# Patient Record
Sex: Male | Born: 1950
Health system: Southern US, Community
[De-identification: ages and names within clinical notes are randomized; demographics above are authoritative.]

## PROBLEM LIST (undated history)

## (undated) DIAGNOSIS — N179 Acute kidney failure, unspecified: Secondary | ICD-10-CM

## (undated) DIAGNOSIS — I10 Essential (primary) hypertension: Secondary | ICD-10-CM

## (undated) DIAGNOSIS — D649 Anemia, unspecified: Secondary | ICD-10-CM

## (undated) DIAGNOSIS — R6521 Severe sepsis with septic shock: Secondary | ICD-10-CM

## (undated) DIAGNOSIS — E669 Obesity, unspecified: Secondary | ICD-10-CM

## (undated) DIAGNOSIS — Z9289 Personal history of other medical treatment: Secondary | ICD-10-CM

## (undated) DIAGNOSIS — Z87442 Personal history of urinary calculi: Secondary | ICD-10-CM

## (undated) DIAGNOSIS — G4730 Sleep apnea, unspecified: Secondary | ICD-10-CM

## (undated) DIAGNOSIS — K2210 Ulcer of esophagus without bleeding: Secondary | ICD-10-CM

## (undated) DIAGNOSIS — T7840XA Allergy, unspecified, initial encounter: Secondary | ICD-10-CM

## (undated) DIAGNOSIS — R7881 Bacteremia: Secondary | ICD-10-CM

## (undated) DIAGNOSIS — K922 Gastrointestinal hemorrhage, unspecified: Secondary | ICD-10-CM

## (undated) DIAGNOSIS — B955 Unspecified streptococcus as the cause of diseases classified elsewhere: Secondary | ICD-10-CM

## (undated) DIAGNOSIS — M1990 Unspecified osteoarthritis, unspecified site: Secondary | ICD-10-CM

## (undated) DIAGNOSIS — I4510 Unspecified right bundle-branch block: Secondary | ICD-10-CM

## (undated) DIAGNOSIS — T8454XA Infection and inflammatory reaction due to internal left knee prosthesis, initial encounter: Secondary | ICD-10-CM

## (undated) DIAGNOSIS — E785 Hyperlipidemia, unspecified: Secondary | ICD-10-CM

## (undated) DIAGNOSIS — A419 Sepsis, unspecified organism: Secondary | ICD-10-CM

## (undated) DIAGNOSIS — B3781 Candidal esophagitis: Secondary | ICD-10-CM

## (undated) DIAGNOSIS — Z86010 Personal history of colonic polyps: Secondary | ICD-10-CM

## (undated) HISTORY — DX: Gastrointestinal hemorrhage, unspecified: K92.2

## (undated) HISTORY — DX: Hyperlipidemia, unspecified: E78.5

## (undated) HISTORY — PX: TONSILLECTOMY: SUR1361

## (undated) HISTORY — DX: Personal history of colonic polyps: Z86.010

## (undated) HISTORY — DX: Severe sepsis with septic shock: R65.21

## (undated) HISTORY — DX: Acute kidney failure, unspecified: N17.9

## (undated) HISTORY — DX: Unspecified streptococcus as the cause of diseases classified elsewhere: B95.5

## (undated) HISTORY — DX: Ulcer of esophagus without bleeding: K22.10

## (undated) HISTORY — DX: Sepsis, unspecified organism: A41.9

## (undated) HISTORY — DX: Candidal esophagitis: B37.81

## (undated) HISTORY — DX: Bacteremia: R78.81

## (undated) HISTORY — DX: Allergy, unspecified, initial encounter: T78.40XA

---

## 1998-08-29 ENCOUNTER — Encounter: Admission: RE | Admit: 1998-08-29 | Discharge: 1998-11-27 | Payer: Self-pay | Admitting: Endocrinology

## 1999-10-23 ENCOUNTER — Ambulatory Visit: Admission: RE | Admit: 1999-10-23 | Discharge: 1999-10-23 | Payer: Self-pay | Admitting: Endocrinology

## 2004-07-16 HISTORY — PX: OTHER SURGICAL HISTORY: SHX169

## 2005-07-03 ENCOUNTER — Ambulatory Visit (HOSPITAL_COMMUNITY): Admission: RE | Admit: 2005-07-03 | Discharge: 2005-07-04 | Payer: Self-pay | Admitting: Orthopedic Surgery

## 2006-07-25 ENCOUNTER — Ambulatory Visit (HOSPITAL_COMMUNITY): Admission: RE | Admit: 2006-07-25 | Discharge: 2006-07-25 | Payer: Self-pay | Admitting: *Deleted

## 2009-07-16 HISTORY — PX: NASAL SEPTUM SURGERY: SHX37

## 2010-10-20 ENCOUNTER — Other Ambulatory Visit (HOSPITAL_COMMUNITY): Payer: Self-pay | Admitting: Otolaryngology

## 2010-10-20 ENCOUNTER — Ambulatory Visit (HOSPITAL_COMMUNITY)
Admission: RE | Admit: 2010-10-20 | Discharge: 2010-10-20 | Disposition: A | Discharge status: Home / Self Care | Payer: BC Managed Care – PPO | Source: Ambulatory Visit | Attending: Otolaryngology | Admitting: Otolaryngology

## 2010-10-20 ENCOUNTER — Encounter (HOSPITAL_COMMUNITY)
Admission: RE | Admit: 2010-10-20 | Discharge: 2010-10-20 | Disposition: A | Discharge status: Home / Self Care | Payer: BC Managed Care – PPO | Source: Ambulatory Visit | Attending: Otolaryngology | Admitting: Otolaryngology

## 2010-10-20 DIAGNOSIS — J342 Deviated nasal septum: Secondary | ICD-10-CM | POA: Insufficient documentation

## 2010-10-20 DIAGNOSIS — Z01810 Encounter for preprocedural cardiovascular examination: Secondary | ICD-10-CM | POA: Insufficient documentation

## 2010-10-20 DIAGNOSIS — Z01812 Encounter for preprocedural laboratory examination: Secondary | ICD-10-CM | POA: Insufficient documentation

## 2010-10-20 DIAGNOSIS — E119 Type 2 diabetes mellitus without complications: Secondary | ICD-10-CM | POA: Insufficient documentation

## 2010-10-20 DIAGNOSIS — Z01818 Encounter for other preprocedural examination: Secondary | ICD-10-CM | POA: Insufficient documentation

## 2010-10-20 DIAGNOSIS — I10 Essential (primary) hypertension: Secondary | ICD-10-CM | POA: Insufficient documentation

## 2010-10-20 DIAGNOSIS — G4730 Sleep apnea, unspecified: Secondary | ICD-10-CM | POA: Insufficient documentation

## 2010-10-20 LAB — BASIC METABOLIC PANEL WITH GFR
BUN: 17 mg/dL (ref 6–23)
CO2: 25 meq/L (ref 19–32)
Calcium: 9.3 mg/dL (ref 8.4–10.5)
Chloride: 105 meq/L (ref 96–112)
Creatinine, Ser: 1.14 mg/dL (ref 0.4–1.5)
GFR calc non Af Amer: >60 mL/min
Glucose, Bld: 160 mg/dL — ABNORMAL HIGH (ref 70–99)
Potassium: 4.3 meq/L (ref 3.5–5.1)
Sodium: 137 meq/L (ref 135–145)

## 2010-10-20 LAB — CBC
HCT: 44.7 % (ref 39.0–52.0)
Hemoglobin: 15 g/dL (ref 13.0–17.0)
MCH: 30.4 pg (ref 26.0–34.0)
MCHC: 33.6 g/dL (ref 30.0–36.0)
MCV: 90.5 fL (ref 78.0–100.0)
Platelets: 261 K/uL (ref 150–400)
RBC: 4.94 MIL/uL (ref 4.22–5.81)
RDW: 13.1 % (ref 11.5–15.5)
WBC: 8.5 K/uL (ref 4.0–10.5)

## 2010-10-20 LAB — SURGICAL PCR SCREEN
MRSA, PCR: POSITIVE — AB
Staphylococcus aureus: POSITIVE — AB

## 2010-10-27 ENCOUNTER — Observation Stay (HOSPITAL_COMMUNITY)
Admission: RE | Admit: 2010-10-27 | Discharge: 2010-10-28 | Disposition: A | Discharge status: Home / Self Care | Payer: BC Managed Care – PPO | Source: Ambulatory Visit | Attending: Otolaryngology | Admitting: Otolaryngology

## 2010-10-27 DIAGNOSIS — E119 Type 2 diabetes mellitus without complications: Secondary | ICD-10-CM | POA: Insufficient documentation

## 2010-10-27 DIAGNOSIS — J342 Deviated nasal septum: Principal | ICD-10-CM | POA: Insufficient documentation

## 2010-10-27 DIAGNOSIS — G4733 Obstructive sleep apnea (adult) (pediatric): Secondary | ICD-10-CM | POA: Insufficient documentation

## 2010-10-27 DIAGNOSIS — J343 Hypertrophy of nasal turbinates: Secondary | ICD-10-CM | POA: Insufficient documentation

## 2010-10-27 LAB — GLUCOSE, CAPILLARY
Glucose-Capillary: 188 mg/dL — ABNORMAL HIGH (ref 70–99)
Glucose-Capillary: 277 mg/dL — ABNORMAL HIGH (ref 70–99)

## 2010-11-13 NOTE — Op Note (Signed)
NAME:  Hunter Santos, Hunter Santos                 ACCOUNT NO.:  [id number]  MEDICAL RECORD NO.:  [id number]           PATIENT TYPE:  O  LOCATION:  2610                         FACILITY:  [facility]  PHYSICIAN:  Kinnie Scales. Annalee Genta, M.D.DATE OF BIRTH:  1950/09/22  DATE OF PROCEDURE:  10/27/2010 DATE OF DISCHARGE:                              OPERATIVE REPORT   PREOPERATIVE DIAGNOSES: 1. Nasal airway obstruction. 2. Deviated nasal septum. 3. Inferior turbinate hypertrophy. 4. Moderately severe obstructive sleep apnea.  POSTOPERATIVE DIAGNOSES: 1. Nasal airway obstruction. 2. Deviated nasal septum. 3. Inferior turbinate hypertrophy. 4. Moderately severe obstructive sleep apnea.  INDICATIONS FOR SURGERY: 1. Nasal airway obstruction. 2. Deviated nasal septum. 3. Inferior turbinate hypertrophy. 4. Moderately severe obstructive sleep apnea.  SURGICAL PROCEDURES: 1. Nasal septoplasty. 2. Bilateral inferior turbinate reduction.  SURGEON:  Kinnie Scales. Annalee Genta, MD.  ANESTHESIA:  General endotracheal.  COMPLICATIONS:  None.  BLOOD LOSS:  Minimal.  The patient was transferred from the operating room to the recovery room in stable condition.  BRIEF HISTORY:  The patient is a 60 year old white male, who was referred with a history of obstructive sleep apnea.  Previous sleep study showed moderately severe sleep apnea and the patient was unable tolerate CPAP because of severe nasal airway obstruction.  Examination in the office showed severely deviated septum with left septal spurring and inferior turbinate hypertrophy.  Given his history, examination and physical findings, I recommended that we undertake nasal septoplasty and turbinate reduction.  The risk, benefits, and possible complications of procedure were discussed with the patient and his wife.  They understood and concurred with out plan for surgery, which is scheduled as an outpatient under general anesthesia with overnight observation for  sleep apnea management.  DESCRIPTION OF PROCEDURE:  The patient was brought to the operating room on October 27, 2010, placed in supine position on the operating table. General endotracheal anesthesia was established without difficulty. When the patient was adequately anesthetized, the nose was injected with total of 8 mL of 1% lidocaine and 1:100,000 solution of epinephrine injected in submucosal fashion on the nasal septum and inferior turbinates bilaterally.  The patient was then position on the operating table and prepped and draped in sterile fashion.  His nose was packed with Afrin soaked cottonoid pledgets and left in place for approximately 10 minutes to allow for vasoconstriction and hemostasis.  Procedure was begun by creating a left anterior hemitransfixion incision.  This was carried through the mucosa underlying submucosa and the mucoperichondrial flap was elevated on the left-hand side.  Bony cartilaginous junction was crossed and mucoperiosteal flap elevated on the right.  Mid septal cartilage was mobilized and then removed, later morselized and returned to the mucoperichondrial pocket.  The deviated bone and cartilage in the mid and posterior aspects of nasal septum were then resected.  A large inferior septal spur was mobilized with a 4-mm osteotome and removed, preserving the overlying mucosa. Mucoperichondrial flaps were reapproximated after replacing the septal cartilage.  The flaps were sutured with a 4-0 gut suture on a Keith needle in horizontal mattress fashion and the anterior hemitransfixion incision was closed with  the same stitch.  Bilateral Doyle nasal septal splints were then placed after the application of Bactroban ointment, sutured in position with a 3-0 Ethilon suture.  Inferior turbinate reduction was then performed with cautery set at 12 watts.  Two submucosal passes were made in each inferior turbinate. Anterior incisions were created in the inferior  turbinate and overlying soft tissue and mucosa was elevated.  Small amount of turbinate bone was then resected.  The turbinates were outfractured to create a more patent nasal cavity.  Nasal cavity and nasopharynx were irrigated and suctioned.  No bleeding.  Orogastric tube was passed.  Stomach contents were aspirated.  The patient was awakened from his anesthetic, extubated, and then transferred from the operating room to the recovery room in stable condition.  No complications.  Blood loss was minimal.          ______________________________ Kinnie Scales. Annalee Genta, M.D.     DLS/MEDQ  D:  16/04/9603  T:  10/27/2010  Job:  [id number]  Electronically Signed by Osborn Coho M.D. on 11/13/2010 10:23:53 AM

## 2012-04-11 NOTE — H&P (Signed)
Hunter Santos is an 61 y.o. male.    Chief Complaint:   Left knee OA and pain   HPI: Hunter Santos is a 61 y.o. male complaining of left knee pain for 7 years.  He has had increasing pain in the left ankle and had surgery on the ankle in 2006. He has been doing well since that time, but the knee continues to increase in pain. Pain had continually increased since the beginning, especially over the last couple of months.  It has been effecting him so significantly that he has had to change positions at his job, so that he will put less stress on knee. X-rays in the clinic show bone-on-bone end-stage arthritic changes of the left knee. Hunter Santos has tried various conservative treatments which have failed to alleviate their symptoms. Various options are discussed with the patient. Risks, benefits and expectations were discussed with the patient. Patient understand the risks, benefits and expectations and wishes to proceed with surgery.   PCP:  No primary provider on file.  D/C Plans:  Home with HHPT  Post-op Meds:   Rx given for ASA, Robaxin, Iron, Colace and MiraLax  Tranexamic Acid:   To be given  Decadron:   Not to be given  PMH: HTN Sleep apnea DM Arthritis  PSH: Left ankle surgery  04/2005  Social History: Patient denies the use of tobacco  Allergies:  NKDA  Medications: Aspirin               81 mg          1 PO daily Glimepiride          4 mg          1 PO daily Lasix                 40 mg           1 PO daily Amlodipine        10 mg           1 PO daily Ramipril             10 mg          1 PO daily Metformin       1000 mg           Pravastatin        40 mg          1 PO daily  ROS: Review of Systems  Constitutional: Negative.   HENT: Negative.   Eyes: Negative.   Respiratory: Negative.   Cardiovascular: Negative.   Gastrointestinal: Negative.   Genitourinary: Negative.   Musculoskeletal: Positive for joint pain.  Skin: Negative.   Neurological: Negative.     Endo/Heme/Allergies: Negative.   Psychiatric/Behavioral: Negative.      Physical Exam: BP:   132/80  ;  HR:   80  ; Resp:   16  ; Physical Exam  Constitutional: He is oriented to person, place, and time and well-developed, well-nourished, and in no distress.  HENT:  Head: Normocephalic and atraumatic.  Nose: Nose normal.  Mouth/Throat: Oropharynx is clear and moist.  Eyes: Pupils are equal, round, and reactive to light.  Neck: Neck supple. No JVD present. No tracheal deviation present. No thyromegaly present.  Cardiovascular: Normal rate, regular rhythm and intact distal pulses.   Pulmonary/Chest: Effort normal and breath sounds normal. No stridor. No respiratory distress. He has no wheezes.  Abdominal: Soft. There is no tenderness. There is no  guarding.  Musculoskeletal:       Left knee: He exhibits decreased range of motion, swelling, deformity (valgus) and bony tenderness. He exhibits no effusion, no ecchymosis and no laceration. tenderness found.  Lymphadenopathy:    He has no cervical adenopathy.  Neurological: He is alert and oriented to person, place, and time.  Skin: Skin is warm and dry.  Psychiatric: Affect normal.    Assessment/Plan Assessment:   Left knee OA and pain   Plan: Patient will undergo a left total knee arthroplasty on 04/29/2012 per Dr. Charlann Boxer at [facility]. Risks benefits and expectations were discussed with the patient. Patient understand risks, benefits and expectations and wishes to proceed.   Anastasio Auerbach Baeleigh Devincent   PAC  04/11/2012, 11:33 AM

## 2012-04-17 NOTE — Progress Notes (Signed)
Medical clearance note dr Juleen China on chart.

## 2012-04-18 ENCOUNTER — Encounter (HOSPITAL_COMMUNITY): Payer: Self-pay | Admitting: Pharmacy Technician

## 2012-04-18 ENCOUNTER — Encounter (HOSPITAL_COMMUNITY)
Admission: RE | Admit: 2012-04-18 | Discharge: 2012-04-18 | Disposition: A | Discharge status: Home / Self Care | Payer: BC Managed Care – PPO | Source: Ambulatory Visit | Attending: Orthopedic Surgery | Admitting: Orthopedic Surgery

## 2012-04-18 ENCOUNTER — Encounter (HOSPITAL_COMMUNITY): Payer: Self-pay

## 2012-04-18 HISTORY — DX: Sleep apnea, unspecified: G47.30

## 2012-04-18 HISTORY — DX: Essential (primary) hypertension: I10

## 2012-04-18 LAB — BASIC METABOLIC PANEL
BUN: 18 mg/dL (ref 6–23)
Creatinine, Ser: 0.85 mg/dL (ref 0.50–1.35)
GFR calc Af Amer: >90 mL/min (ref >90)
GFR calc non Af Amer: >90 mL/min (ref >90)
Potassium: 4.3 mEq/L (ref 3.5–5.1)

## 2012-04-18 LAB — PROTIME-INR
INR: 0.96 (ref 0.00–1.49)
Prothrombin Time: 12.7 seconds (ref 11.6–15.2)

## 2012-04-18 LAB — URINALYSIS, ROUTINE W REFLEX MICROSCOPIC
Bilirubin Urine: NEGATIVE
Ketones, ur: NEGATIVE mg/dL
Nitrite: NEGATIVE
Protein, ur: NEGATIVE mg/dL
Urobilinogen, UA: 0.2 mg/dL (ref 0.0–1.0)

## 2012-04-18 LAB — CBC
MCHC: 33.3 g/dL (ref 30.0–36.0)
RDW: 12.8 % (ref 11.5–15.5)

## 2012-04-18 LAB — APTT: aPTT: 29 seconds (ref 24–37)

## 2012-04-18 LAB — SURGICAL PCR SCREEN: MRSA, PCR: NEGATIVE

## 2012-04-18 NOTE — Progress Notes (Signed)
Chest x-ray from [facility]  07/19/2011 on chart, EKG from [facility] 03/30/2005 on chart.

## 2012-04-18 NOTE — Patient Instructions (Signed)
20      Your procedure is scheduled on:  Tuesday 04/29/2012 at 0715 am  Report to [facility] at 0515  AM.  Call this number if you have problems the morning of surgery: [phone]   Remember:   Do not eat food or drink liquids after midnight!  Take these medicines the morning of surgery with A SIP OF WATER: Amlodipine   Do not bring valuables to the hospital.  .  Leave suitcase in the car. After surgery it may be brought to your room.  For patients admitted to the hospital, checkout time is 11:00 AM the day of              Discharge.    Special Instructions: See [facility] Preparing  For Surgery Instruction Sheet. Do not wear jewelry, lotions powders, perfumes. Women do not shave legs or underarms for 12 hours before showers. Contacts, partial plates, or dentures may not be worn into surgery.                          Patients discharged the day of surgery will not be allowed to drive home.  If going home the same day of surgery, must have someone stay with you first 24 hrs.at home and arrange for someone to drive you home from the              Hospital.   Please read over the following fact sheets that you were given: MRSA              INFORMATION, Blood Transfusion sheet, Incentive Spirometry sheet, Sleep apnea sheet               Telford Nab.Xolani Degracia,RN,BSN [id number]

## 2012-04-29 ENCOUNTER — Inpatient Hospital Stay (HOSPITAL_COMMUNITY)
Admission: RE | Admit: 2012-04-29 | Discharge: 2012-04-30 | DRG: 209 | Disposition: A | Discharge status: Home Health | Payer: BC Managed Care – PPO | Source: Ambulatory Visit | Attending: Orthopedic Surgery | Admitting: Orthopedic Surgery

## 2012-04-29 ENCOUNTER — Encounter (HOSPITAL_COMMUNITY): Payer: Self-pay | Admitting: Certified Registered Nurse Anesthetist

## 2012-04-29 ENCOUNTER — Ambulatory Visit (HOSPITAL_COMMUNITY): Payer: BC Managed Care – PPO | Admitting: Certified Registered Nurse Anesthetist

## 2012-04-29 ENCOUNTER — Encounter (HOSPITAL_COMMUNITY)
Admission: RE | Disposition: A | Discharge status: Home Health | Payer: Self-pay | Source: Ambulatory Visit | Attending: Orthopedic Surgery

## 2012-04-29 ENCOUNTER — Encounter (HOSPITAL_COMMUNITY): Payer: Self-pay | Admitting: *Deleted

## 2012-04-29 DIAGNOSIS — E871 Hypo-osmolality and hyponatremia: Secondary | ICD-10-CM | POA: Diagnosis not present

## 2012-04-29 DIAGNOSIS — E119 Type 2 diabetes mellitus without complications: Secondary | ICD-10-CM | POA: Diagnosis present

## 2012-04-29 DIAGNOSIS — E669 Obesity, unspecified: Secondary | ICD-10-CM | POA: Diagnosis present

## 2012-04-29 DIAGNOSIS — D62 Acute posthemorrhagic anemia: Secondary | ICD-10-CM | POA: Diagnosis not present

## 2012-04-29 DIAGNOSIS — M1710 Unilateral primary osteoarthritis, unspecified knee: Principal | ICD-10-CM | POA: Diagnosis present

## 2012-04-29 DIAGNOSIS — Z01812 Encounter for preprocedural laboratory examination: Secondary | ICD-10-CM

## 2012-04-29 DIAGNOSIS — I10 Essential (primary) hypertension: Secondary | ICD-10-CM | POA: Diagnosis present

## 2012-04-29 DIAGNOSIS — Z96659 Presence of unspecified artificial knee joint: Secondary | ICD-10-CM

## 2012-04-29 HISTORY — PX: TOTAL KNEE ARTHROPLASTY: SHX125

## 2012-04-29 LAB — ABO/RH: ABO/RH(D): AB POS

## 2012-04-29 LAB — GLUCOSE, CAPILLARY
Glucose-Capillary: 121 mg/dL — ABNORMAL HIGH (ref 70–99)
Glucose-Capillary: 173 mg/dL — ABNORMAL HIGH (ref 70–99)

## 2012-04-29 LAB — TYPE AND SCREEN

## 2012-04-29 SURGERY — ARTHROPLASTY, KNEE, TOTAL
Anesthesia: Spinal | Site: Knee | Laterality: Left | Wound class: Clean

## 2012-04-29 MED ORDER — TRANEXAMIC ACID 100 MG/ML IV SOLN
1880.0000 mg | Freq: Once | INTRAVENOUS | Status: DC
  Filled 2012-04-29: qty 18.8

## 2012-04-29 MED ORDER — OXYCODONE HCL 5 MG PO TABS: 5.0000 mg | ORAL_TABLET | Freq: Once | ORAL | Status: DC | PRN

## 2012-04-29 MED ORDER — CEFAZOLIN SODIUM 1-5 GM-% IV SOLN
INTRAVENOUS | Status: AC
  Filled 2012-04-29: qty 50

## 2012-04-29 MED ORDER — INSULIN ASPART 100 UNIT/ML ~~LOC~~ SOLN
0.0000 [IU] | Freq: Three times a day (TID) | SUBCUTANEOUS | Status: DC
  Administered 2012-04-29 (×2): 2 [IU] via SUBCUTANEOUS
  Administered 2012-04-30: 3 [IU] via SUBCUTANEOUS
  Administered 2012-04-30: 2 [IU] via SUBCUTANEOUS

## 2012-04-29 MED ORDER — SODIUM CHLORIDE 0.9 % IV SOLN
INTRAVENOUS | Status: DC
  Administered 2012-04-29 (×2): via INTRAVENOUS
  Filled 2012-04-29 (×10): qty 1000

## 2012-04-29 MED ORDER — LACTATED RINGERS IV SOLN
INTRAVENOUS | Status: DC | PRN
  Administered 2012-04-29 (×2): via INTRAVENOUS

## 2012-04-29 MED ORDER — METHOCARBAMOL 500 MG PO TABS
500.0000 mg | ORAL_TABLET | Freq: Four times a day (QID) | ORAL | Status: DC | PRN
  Administered 2012-04-29 – 2012-04-30 (×2): 500 mg via ORAL
  Filled 2012-04-29 (×2): qty 1

## 2012-04-29 MED ORDER — AMLODIPINE BESYLATE 10 MG PO TABS
10.0000 mg | ORAL_TABLET | Freq: Every day | ORAL | Status: DC
  Administered 2012-04-30: 10 mg via ORAL
  Filled 2012-04-29: qty 1

## 2012-04-29 MED ORDER — DEXTROSE 5 % IV SOLN
500.0000 mg | Freq: Four times a day (QID) | INTRAVENOUS | Status: DC | PRN
  Administered 2012-04-29: 500 mg via INTRAVENOUS
  Filled 2012-04-29 (×2): qty 5

## 2012-04-29 MED ORDER — BUPIVACAINE-EPINEPHRINE PF 0.25-1:200000 % IJ SOLN
INTRAMUSCULAR | Status: DC | PRN
  Administered 2012-04-29: 50 mL

## 2012-04-29 MED ORDER — ONDANSETRON HCL 4 MG PO TABS: 4.0000 mg | ORAL_TABLET | Freq: Four times a day (QID) | ORAL | Status: DC | PRN

## 2012-04-29 MED ORDER — DEXTROSE 5 % IV SOLN
3.0000 g | Freq: Once | INTRAVENOUS | Status: AC
  Administered 2012-04-29: 1 g via INTRAVENOUS
  Administered 2012-04-29: 2 g via INTRAVENOUS

## 2012-04-29 MED ORDER — CEFAZOLIN SODIUM-DEXTROSE 2-3 GM-% IV SOLR
INTRAVENOUS | Status: AC
  Filled 2012-04-29: qty 50

## 2012-04-29 MED ORDER — ACETAMINOPHEN 10 MG/ML IV SOLN
INTRAVENOUS | Status: DC | PRN
  Administered 2012-04-29: 1000 mg via INTRAVENOUS

## 2012-04-29 MED ORDER — HYDROMORPHONE HCL PF 1 MG/ML IJ SOLN
0.5000 mg | INTRAMUSCULAR | Status: DC | PRN
  Administered 2012-04-29: 0.5 mg via INTRAVENOUS
  Filled 2012-04-29: qty 1

## 2012-04-29 MED ORDER — MEPERIDINE HCL 50 MG/ML IJ SOLN: 6.2500 mg | INTRAMUSCULAR | Status: DC | PRN

## 2012-04-29 MED ORDER — GLIMEPIRIDE 4 MG PO TABS
4.0000 mg | ORAL_TABLET | Freq: Every day | ORAL | Status: DC
  Administered 2012-04-30: 4 mg via ORAL
  Filled 2012-04-29 (×3): qty 1

## 2012-04-29 MED ORDER — INSULIN ASPART 100 UNIT/ML ~~LOC~~ SOLN
2.0000 [IU] | Freq: Once | SUBCUTANEOUS | Status: AC
  Administered 2012-04-29: 2 [IU] via SUBCUTANEOUS

## 2012-04-29 MED ORDER — PROPOFOL INFUSION 10 MG/ML OPTIME
INTRAVENOUS | Status: DC | PRN
  Administered 2012-04-29: 50 ug/kg/min via INTRAVENOUS

## 2012-04-29 MED ORDER — 0.9 % SODIUM CHLORIDE (POUR BTL) OPTIME
TOPICAL | Status: DC | PRN
  Administered 2012-04-29: 1000 mL

## 2012-04-29 MED ORDER — HYDROMORPHONE HCL PF 1 MG/ML IJ SOLN: 0.2500 mg | INTRAMUSCULAR | Status: DC | PRN

## 2012-04-29 MED ORDER — TRANEXAMIC ACID 100 MG/ML IV SOLN
2500.0000 mg | INTRAVENOUS | Status: DC | PRN
  Administered 2012-04-29: 1880 mg via INTRAVENOUS

## 2012-04-29 MED ORDER — BUPIVACAINE HCL 0.75 % IJ SOLN
INTRAMUSCULAR | Status: DC | PRN
  Administered 2012-04-29: 2 mL via INTRATHECAL

## 2012-04-29 MED ORDER — ALUMINUM HYDROXIDE GEL 320 MG/5ML PO SUSP: 15.0000 mL | ORAL | Status: DC | PRN

## 2012-04-29 MED ORDER — CEFAZOLIN SODIUM-DEXTROSE 2-3 GM-% IV SOLR
2.0000 g | Freq: Four times a day (QID) | INTRAVENOUS | Status: AC
  Administered 2012-04-29 (×2): 2 g via INTRAVENOUS
  Filled 2012-04-29 (×2): qty 50

## 2012-04-29 MED ORDER — DOCUSATE SODIUM 100 MG PO CAPS
100.0000 mg | ORAL_CAPSULE | Freq: Two times a day (BID) | ORAL | Status: DC
  Administered 2012-04-29 – 2012-04-30 (×3): 100 mg via ORAL

## 2012-04-29 MED ORDER — METFORMIN HCL 500 MG PO TABS
1000.0000 mg | ORAL_TABLET | Freq: Two times a day (BID) | ORAL | Status: DC
  Administered 2012-04-29 – 2012-04-30 (×2): 1000 mg via ORAL
  Filled 2012-04-29 (×5): qty 2

## 2012-04-29 MED ORDER — DIPHENHYDRAMINE HCL 12.5 MG/5ML PO ELIX: 25.0000 mg | ORAL_SOLUTION | Freq: Four times a day (QID) | ORAL | Status: DC | PRN

## 2012-04-29 MED ORDER — OXYCODONE HCL 5 MG/5ML PO SOLN
5.0000 mg | Freq: Once | ORAL | Status: DC | PRN
  Filled 2012-04-29: qty 5

## 2012-04-29 MED ORDER — HYDROMORPHONE HCL PF 1 MG/ML IJ SOLN: 0.2000 mg | INTRAMUSCULAR | Status: DC | PRN

## 2012-04-29 MED ORDER — CHLORHEXIDINE GLUCONATE 4 % EX LIQD: 60.0000 mL | Freq: Once | CUTANEOUS | Status: DC

## 2012-04-29 MED ORDER — BUPIVACAINE-EPINEPHRINE 0.25% -1:200000 IJ SOLN
INTRAMUSCULAR | Status: AC
  Filled 2012-04-29: qty 1

## 2012-04-29 MED ORDER — FENTANYL CITRATE 0.05 MG/ML IJ SOLN
INTRAMUSCULAR | Status: DC | PRN
  Administered 2012-04-29: 50 ug via INTRAVENOUS
  Administered 2012-04-29: 25 ug via INTRAVENOUS
  Administered 2012-04-29 (×2): 50 ug via INTRAVENOUS
  Administered 2012-04-29: 25 ug via INTRAVENOUS
  Administered 2012-04-29: 50 ug via INTRAVENOUS

## 2012-04-29 MED ORDER — PHENOL 1.4 % MT LIQD
1.0000 | OROMUCOSAL | Status: DC | PRN
  Filled 2012-04-29: qty 177

## 2012-04-29 MED ORDER — LIDOCAINE HCL (CARDIAC) 20 MG/ML IV SOLN
INTRAVENOUS | Status: DC | PRN
  Administered 2012-04-29: 50 mg via INTRAVENOUS

## 2012-04-29 MED ORDER — RIVAROXABAN 10 MG PO TABS
10.0000 mg | ORAL_TABLET | Freq: Every day | ORAL | Status: DC
  Administered 2012-04-30: 10 mg via ORAL
  Filled 2012-04-29 (×3): qty 1

## 2012-04-29 MED ORDER — ONDANSETRON HCL 4 MG/2ML IJ SOLN
4.0000 mg | Freq: Four times a day (QID) | INTRAMUSCULAR | Status: DC | PRN
  Administered 2012-04-29: 4 mg via INTRAVENOUS
  Filled 2012-04-29: qty 2

## 2012-04-29 MED ORDER — MENTHOL 3 MG MT LOZG
1.0000 | LOZENGE | OROMUCOSAL | Status: DC | PRN
  Filled 2012-04-29: qty 9

## 2012-04-29 MED ORDER — LACTATED RINGERS IV SOLN: INTRAVENOUS | Status: DC

## 2012-04-29 MED ORDER — SENNA 8.6 MG PO TABS
1.0000 | ORAL_TABLET | Freq: Two times a day (BID) | ORAL | Status: DC
  Administered 2012-04-29 – 2012-04-30 (×3): 8.6 mg via ORAL
  Filled 2012-04-29 (×3): qty 1

## 2012-04-29 MED ORDER — KETOROLAC TROMETHAMINE 30 MG/ML IJ SOLN
INTRAMUSCULAR | Status: DC | PRN
  Administered 2012-04-29: 30 mg via INTRAVENOUS

## 2012-04-29 MED ORDER — KETOROLAC TROMETHAMINE 30 MG/ML IJ SOLN
30.0000 mg | Freq: Once | INTRAMUSCULAR | Status: DC
  Filled 2012-04-29: qty 1

## 2012-04-29 MED ORDER — MIDAZOLAM HCL 5 MG/5ML IJ SOLN
INTRAMUSCULAR | Status: DC | PRN
  Administered 2012-04-29 (×2): 1 mg via INTRAVENOUS

## 2012-04-29 MED ORDER — FUROSEMIDE 40 MG PO TABS
40.0000 mg | ORAL_TABLET | Freq: Every day | ORAL | Status: DC
  Administered 2012-04-29 – 2012-04-30 (×2): 40 mg via ORAL
  Filled 2012-04-29 (×2): qty 1

## 2012-04-29 MED ORDER — FERROUS SULFATE 325 (65 FE) MG PO TABS
325.0000 mg | ORAL_TABLET | Freq: Three times a day (TID) | ORAL | Status: DC
  Administered 2012-04-29 – 2012-04-30 (×4): 325 mg via ORAL
  Filled 2012-04-29 (×7): qty 1

## 2012-04-29 MED ORDER — HYDROCODONE-ACETAMINOPHEN 7.5-325 MG PO TABS
1.0000 | ORAL_TABLET | ORAL | Status: DC
  Administered 2012-04-29 (×2): 1 via ORAL
  Administered 2012-04-29 – 2012-04-30 (×6): 2 via ORAL
  Filled 2012-04-29: qty 2
  Filled 2012-04-29: qty 1
  Filled 2012-04-29: qty 2
  Filled 2012-04-29: qty 1
  Filled 2012-04-29 (×4): qty 2

## 2012-04-29 MED ORDER — ACETAMINOPHEN 10 MG/ML IV SOLN
INTRAVENOUS | Status: AC
  Filled 2012-04-29: qty 100

## 2012-04-29 MED ORDER — POLYETHYLENE GLYCOL 3350 17 G PO PACK: 17.0000 g | PACK | Freq: Every day | ORAL | Status: DC | PRN

## 2012-04-29 MED ORDER — RAMIPRIL 10 MG PO TABS
10.0000 mg | ORAL_TABLET | Freq: Every day | ORAL | Status: DC
  Administered 2012-04-29: 10 mg via ORAL
  Filled 2012-04-29 (×2): qty 1

## 2012-04-29 MED ORDER — PROMETHAZINE HCL 25 MG/ML IJ SOLN: 6.2500 mg | INTRAMUSCULAR | Status: DC | PRN

## 2012-04-29 SURGICAL SUPPLY — 59 items
ADH SKN CLS APL DERMABOND .7 (GAUZE/BANDAGES/DRESSINGS) ×1
BAG SPEC THK2 15X12 ZIP CLS (MISCELLANEOUS) ×1
BAG ZIPLOCK 12X15 (MISCELLANEOUS) ×2 IMPLANT
BANDAGE ELASTIC 6 VELCRO ST LF (GAUZE/BANDAGES/DRESSINGS) ×2 IMPLANT
BANDAGE ESMARK 6X9 LF (GAUZE/BANDAGES/DRESSINGS) ×1 IMPLANT
BLADE SAW SGTL 13.0X1.19X90.0M (BLADE) ×2 IMPLANT
BNDG CMPR 9X6 STRL LF SNTH (GAUZE/BANDAGES/DRESSINGS) ×1
BNDG ESMARK 6X9 LF (GAUZE/BANDAGES/DRESSINGS) ×2
BONE CEMENT GENTAMICIN (Cement) ×4 IMPLANT
BOWL SMART MIX CTS (DISPOSABLE) ×2 IMPLANT
CEMENT BONE GENTAMICIN 40 (Cement) IMPLANT
CLOTH BEACON ORANGE TIMEOUT ST (SAFETY) ×2 IMPLANT
CUFF TOURN SGL QUICK 34 (TOURNIQUET CUFF) ×2
CUFF TRNQT CYL 34X4X40X1 (TOURNIQUET CUFF) ×1 IMPLANT
DECANTER SPIKE VIAL GLASS SM (MISCELLANEOUS) ×2 IMPLANT
DERMABOND ADVANCED (GAUZE/BANDAGES/DRESSINGS) ×1
DERMABOND ADVANCED .7 DNX12 (GAUZE/BANDAGES/DRESSINGS) ×1 IMPLANT
DRAPE EXTREMITY T 121X128X90 (DRAPE) ×2 IMPLANT
DRAPE POUCH INSTRU U-SHP 10X18 (DRAPES) ×2 IMPLANT
DRAPE U-SHAPE 47X51 STRL (DRAPES) ×2 IMPLANT
DRSG AQUACEL AG ADV 3.5X10 (GAUZE/BANDAGES/DRESSINGS) ×2 IMPLANT
DRSG TEGADERM 4X4.75 (GAUZE/BANDAGES/DRESSINGS) ×2 IMPLANT
DURAPREP 26ML APPLICATOR (WOUND CARE) ×2 IMPLANT
ELECT REM PT RETURN 9FT ADLT (ELECTROSURGICAL) ×2
ELECTRODE REM PT RTRN 9FT ADLT (ELECTROSURGICAL) ×1 IMPLANT
EVACUATOR 1/8 PVC DRAIN (DRAIN) ×2 IMPLANT
FACESHIELD LNG OPTICON STERILE (SAFETY) ×10 IMPLANT
GAUZE SPONGE 2X2 8PLY STRL LF (GAUZE/BANDAGES/DRESSINGS) ×1 IMPLANT
GLOVE BIOGEL PI IND STRL 7.5 (GLOVE) ×1 IMPLANT
GLOVE BIOGEL PI IND STRL 8 (GLOVE) ×1 IMPLANT
GLOVE BIOGEL PI INDICATOR 7.5 (GLOVE) ×1
GLOVE BIOGEL PI INDICATOR 8 (GLOVE) ×1
GLOVE ECLIPSE 8.0 STRL XLNG CF (GLOVE) ×2 IMPLANT
GLOVE ORTHO TXT STRL SZ7.5 (GLOVE) ×4 IMPLANT
GOWN BRE IMP PREV XXLGXLNG (GOWN DISPOSABLE) ×4 IMPLANT
GOWN STRL NON-REIN LRG LVL3 (GOWN DISPOSABLE) ×2 IMPLANT
HANDPIECE INTERPULSE COAX TIP (DISPOSABLE) ×2
IMMOBILIZER KNEE 20 (SOFTGOODS) ×2
IMMOBILIZER KNEE 20 THIGH 36 (SOFTGOODS) IMPLANT
KIT BASIN OR (CUSTOM PROCEDURE TRAY) ×2 IMPLANT
MANIFOLD NEPTUNE II (INSTRUMENTS) ×2 IMPLANT
NDL SAFETY ECLIPSE 18X1.5 (NEEDLE) ×1 IMPLANT
NEEDLE HYPO 18GX1.5 SHARP (NEEDLE) ×2
NS IRRIG 1000ML POUR BTL (IV SOLUTION) ×4 IMPLANT
PACK TOTAL JOINT (CUSTOM PROCEDURE TRAY) ×2 IMPLANT
POSITIONER SURGICAL ARM (MISCELLANEOUS) ×2 IMPLANT
SET HNDPC FAN SPRY TIP SCT (DISPOSABLE) ×1 IMPLANT
SET PAD KNEE POSITIONER (MISCELLANEOUS) ×2 IMPLANT
SPONGE GAUZE 2X2 STER 10/PKG (GAUZE/BANDAGES/DRESSINGS) ×1
SUCTION FRAZIER 12FR DISP (SUCTIONS) ×2 IMPLANT
SUT MNCRL AB 4-0 PS2 18 (SUTURE) ×2 IMPLANT
SUT VIC AB 1 CT1 36 (SUTURE) ×6 IMPLANT
SUT VIC AB 2-0 CT1 27 (SUTURE) ×6
SUT VIC AB 2-0 CT1 TAPERPNT 27 (SUTURE) ×3 IMPLANT
SYR 50ML LL SCALE MARK (SYRINGE) ×2 IMPLANT
TOWEL OR 17X26 10 PK STRL BLUE (TOWEL DISPOSABLE) ×4 IMPLANT
TRAY FOLEY CATH 14FRSI W/METER (CATHETERS) ×2 IMPLANT
WATER STERILE IRR 1500ML POUR (IV SOLUTION) ×2 IMPLANT
WRAP KNEE MAXI GEL POST OP (GAUZE/BANDAGES/DRESSINGS) ×2 IMPLANT

## 2012-04-29 NOTE — Anesthesia Postprocedure Evaluation (Signed)
Anesthesia Post Note  Patient: Hunter Santos  Procedure(s) Performed: Procedure(s) (LRB): TOTAL KNEE ARTHROPLASTY (Left)  Anesthesia type: Spinal  Patient location: PACU  Post pain: Pain level controlled  Post assessment: Post-op Vital signs reviewed  Last Vitals: BP 122/75  Pulse 66  Temp 36.1 C (Oral)  Resp 16  Wt 276 lb (125.193 kg)  SpO2 99%  Post vital signs: Reviewed  Level of consciousness: sedated  Complications: No apparent anesthesia complications

## 2012-04-29 NOTE — Transfer of Care (Signed)
Immediate Anesthesia Transfer of Care Note  Patient: Hunter Santos  Procedure(s) Performed: Procedure(s) (LRB) with comments: TOTAL KNEE ARTHROPLASTY (Left)  Patient Location: PACU  Anesthesia Type: Regional  Level of Consciousness: awake, alert  and oriented  Airway & Oxygen Therapy: Patient connected to face mask oxygen  Post-op Assessment: Report given to PACU RN  Post vital signs: Reviewed and stable  Complications: No apparent anesthesia complications

## 2012-04-29 NOTE — Anesthesia Procedure Notes (Signed)
Spinal  Patient location during procedure: OR Start time: 04/29/2012 7:08 AM End time: 04/29/2012 7:12 AM Staffing Anesthesiologist: Lewie Loron R Performed by: anesthesiologist  Preanesthetic Checklist Completed: patient identified, site marked, surgical consent, pre-op evaluation, timeout performed, IV checked, risks and benefits discussed and monitors and equipment checked Spinal Block Patient position: sitting Prep: ChloraPrep Patient monitoring: heart rate, continuous pulse ox and blood pressure Approach: midline Location: L2-3 Injection technique: single-shot Needle Needle type: Quincke  Needle gauge: 22 G Needle length: 9 cm Needle insertion depth: 9 cm Assessment Sensory level: T10 Additional Notes Expiration date of kit checked and confirmed. Patient tolerated procedure well, without complications.

## 2012-04-29 NOTE — Op Note (Signed)
NAME:  Hunter Santos                      MEDICAL RECORD NO.:  [id number]                             FACILITY:  [facility]      PHYSICIAN:  Madlyn Frankel. Charlann Boxer, M.D.  DATE OF BIRTH:  [birth year]      DATE OF PROCEDURE:  04/29/2012                                     OPERATIVE REPORT         PREOPERATIVE DIAGNOSIS:  Left knee osteoarthritis.      POSTOPERATIVE DIAGNOSIS:  Left knee osteoarthritis.      FINDINGS:  The patient was noted to have complete loss of cartilage and   bone-on-bone arthritis with associated osteophytes in the lateral and patellofemoral compartments of   the knee with a significant synovitis and associated effusion. Patient had significant dynamic valgus deformity associated with lateral bone defect, no associated flexion contracture.     PROCEDURE:  Left total knee replacement.      COMPONENTS USED:  DePuy rotating platform posterior stabilized knee   system, a size 5 femur, 5 tibia, 15 mm insert, and 38 patellar   button.      SURGEON:  Madlyn Frankel. Charlann Boxer, M.D.      ASSISTANT:  [name], PA-C.      ANESTHESIA:  Spinal.      SPECIMENS:  None.      COMPLICATION:  None.      DRAINS:  One Hemovac.  EBL: <150cc      TOURNIQUET TIME:   Total Tourniquet Time Documented: Thigh (Left) - 45 minutes .      The patient was stable to the recovery room.      INDICATION FOR PROCEDURE:  Hunter Santos is a 61 y.o. male patient of   mine.  The patient had been seen, evaluated, and treated conservatively in the   office with medication, activity modification, and injections.  The patient had   radiographic changes of bone-on-bone arthritis with endplate sclerosis and osteophytes noted.      The patient failed conservative measures including medication, injections, and activity modification, and at this point was ready for more definitive measures.   Based on the radiographic changes and failed conservative measures, the patient   decided to proceed with total knee  replacement.  Risks of infection,   DVT, component failure, need for revision surgery, postop course, and   expectations were all   discussed and reviewed.  Consent was obtained for benefit of pain   relief.      PROCEDURE IN DETAIL:  The patient was brought to the operative theater.   Once adequate anesthesia, preoperative antibiotics, 3 gm of Ancef administered, the patient was positioned supine with the left thigh tourniquet placed.  The  left lower extremity was prepped and draped in sterile fashion.  A time-   out was performed identifying the patient, planned procedure, and   extremity.      The left lower extremity was placed in the [facility] leg holder.  The leg was   exsanguinated, tourniquet elevated to 250 mmHg.  A midline incision was   made followed by median parapatellar arthrotomy.  Following  initial   exposure, attention was first directed to the patella.  Precut   measurement was noted to be 23 mm.  I resected down to 14 mm and used a   38 patellar button to restore patellar height as well as cover the cut   surface.      The lug holes were drilled and a metal shim was placed to protect the   patella from retractors and saw blades.      At this point, attention was now directed to the femur.  The femoral   canal was opened with a drill, irrigated to try to prevent fat emboli.  An   intramedullary rod was passed at 5 degrees valgus, 10 mm of bone was   resected off the distal femur.  Following this resection, the tibia was   subluxated anteriorly.  Using the extramedullary guide, 1-2 mm of bone was resected off   the proximal lateral tibia.  We confirmed the gap would be   stable medially and laterally with a 10 mm insert as well as confirmed   the cut was perpendicular in the coronal plane, checking with an alignment rod. These cuts represent minimal cuts as there was still exposed sclerotic bone on the lateral tibial plateau.      Once this was done, I sized the femur  to be a size 5 in the anterior-   posterior dimension, chose a standard component based on medial and   lateral dimension.  The size 5 rotation block was then pinned in   position anterior referenced using the C-clamp to set rotation.  The   anterior, posterior, and  chamfer cuts were made without difficulty nor   notching making certain that I was along the anterior cortex to help   with flexion gap stability.      The final box cut was made off the lateral aspect of distal femur.      At this point, the tibia was sized to be a size 5, the size 5 tray was   then pinned in position through the medial third of the tubercle,   drilled, and keel punched.  Trial reduction was now carried with a 5 femur,  5 tibia, a 15 mm insert, and the 38 patella botton.  The knee was brought to   extension, full extension with good flexion stability with the patella   tracking through the trochlea without application of pressure.  Given   all these findings, the trial components removed.  I drilled holes into remaining sclerotic bone on the proximall lateral tibia and the distal femur to allow for improved cement interdigitation. Final components were   opened and cement was mixed.  The knee was irrigated with normal saline   solution and pulse lavage.  The synovial lining was   then injected with 0.25% Marcaine with epinephrine and 1 cc of Toradol,   total of 61 cc.      The knee was irrigated.  Final implants were then cemented onto clean and   dried cut surfaces of bone with the knee brought to extension with a 15   mm trial insert.      Once the cement had fully cured, the excess cement was removed   throughout the knee.  I confirmed I was satisfied with the range of   motion and stability, and the final 15 mm PS insert was chosen.  It was   placed into the knee.  The tourniquet had been let down at 44 minutes.  No significant   hemostasis required.  The medium Hemovac drain was placed deep.   The   extensor mechanism was then reapproximated using #1 Vicryl with the knee   in flexion.  The   remaining wound was closed with 2-0 Vicryl and running 4-0 Monocryl.   The knee was cleaned, dried, dressed sterilely using Dermabond and   Aquacel dressing.  Drain site dressed separately.  The patient was then   brought to recovery room in stable condition, tolerating the procedure   well.   Please note that Physician Assistant, [name], was present for the entirety of the case, and was utilized for pre-operative positioning, peri-operative retractor management, general facilitation of the procedure.  He was also utilized for primary wound closure at the end of the case.              Madlyn Frankel Charlann Boxer, M.D.

## 2012-04-29 NOTE — Plan of Care (Signed)
Problem: Consults Goal: Diagnosis- Total Joint Replacement Primary Total Knee     

## 2012-04-29 NOTE — Evaluation (Signed)
Physical Therapy Evaluation Patient Details Name: Hunter Santos MRN: [id number] DOB: [birth year] Today's Date: 04/29/2012 Time: 7829-5621 PT Time Calculation (min): 20 min  PT Assessment / Plan / Recommendation Clinical Impression  Pt s/p L TKR POD #0.  Pt would benefit from acute PT services in order to improve independence with transfers, ambulation and stairs to prepare for d/c home with spouse.    PT Assessment  Patient needs continued PT services    Follow Up Recommendations  Home health PT    Does the patient have the potential to tolerate intense rehabilitation      Barriers to Discharge        Equipment Recommendations  Other (comment) (wheels for standard walker)    Recommendations for Other Services     Frequency 7X/week    Precautions / Restrictions Precautions Precautions: Knee Required Braces or Orthoses: Knee Immobilizer - Left Knee Immobilizer - Left: Discontinue once straight leg raise with < 10 degree lag Restrictions LLE Weight Bearing: Weight bearing as tolerated   Pertinent Vitals/Pain 3/10 L knee, premedicated, repositioned     Mobility  Bed Mobility Bed Mobility: Supine to Sit;Sit to Supine Supine to Sit: 5: Supervision Sit to Supine: 5: Supervision Details for Bed Mobility Assistance: supervision for lines, pt able to move L LE with increased time and cues Transfers Transfers: Stand to Sit;Sit to Stand Sit to Stand: 4: Min assist;With upper extremity assist;From bed Stand to Sit: 4: Min assist;With upper extremity assist;To bed Details for Transfer Assistance: verbal cues for safe technique, assist to steady upon rise and control descent Ambulation/Gait Ambulation/Gait Assistance: 4: Min guard Ambulation Distance (Feet): 40 Feet Assistive device: Rolling walker Ambulation/Gait Assistance Details: verbal cues for sequence, RW distance, step length Gait Pattern: Step-to pattern;Decreased stance time - left;Antalgic Gait velocity: decreased    Shoulder Instructions     Exercises     PT Diagnosis: Difficulty walking  PT Problem List: Decreased strength;Decreased range of motion;Decreased mobility;Decreased knowledge of use of DME;Pain PT Treatment Interventions: DME instruction;Gait training;Stair training;Functional mobility training;Patient/family education;Therapeutic activities;Therapeutic exercise   PT Goals Acute Rehab PT Goals PT Goal Formulation: With patient Time For Goal Achievement: 05/06/12 Potential to Achieve Goals: Good Pt will go Sit to Stand: with modified independence PT Goal: Sit to Stand - Progress: Goal set today Pt will go Stand to Sit: with modified independence PT Goal: Stand to Sit - Progress: Goal set today Pt will Ambulate: 51 - 150 feet;with modified independence;with least restrictive assistive device PT Goal: Ambulate - Progress: Goal set today Pt will Go Up / Down Stairs: 3-5 stairs;with least restrictive assistive device;with rail(s);with supervision PT Goal: Up/Down Stairs - Progress: Goal set today Pt will Perform Home Exercise Program: with supervision, verbal cues required/provided PT Goal: Perform Home Exercise Program - Progress: Goal set today  Visit Information  Last PT Received On: 04/29/12 Assistance Needed: +1    Subjective Data  Subjective: It does hurt when I put weight on it but I guess that's to be expected.   Prior Functioning  Home Living Lives With: Spouse Type of Home: House Home Access: Stairs to enter Secretary/administrator of Steps: 3 Entrance Stairs-Rails: Right Home Layout: One level Home Adaptive Equipment: Other (comment);Walker - standard (pt describes BSC without the pot) Prior Function Level of Independence: Independent Communication Communication: No difficulties    Cognition  Overall Cognitive Status: Appears within functional limits for tasks assessed/performed Arousal/Alertness: Awake/alert Orientation Level: Appears intact for tasks  assessed Behavior During Session: [facility]  for tasks performed    Extremity/Trunk Assessment Right Upper Extremity Assessment RUE ROM/Strength/Tone: [facility] for tasks assessed Left Upper Extremity Assessment LUE ROM/Strength/Tone: WFL for tasks assessed Right Lower Extremity Assessment RLE ROM/Strength/Tone: [facility] for tasks assessed Left Lower Extremity Assessment LLE ROM/Strength/Tone: Deficits LLE ROM/Strength/Tone Deficits: able to move LE against gravity, good quad contraction, ROM TBA LLE Sensation: WFL - Light Touch   Balance    End of Session PT - End of Session Equipment Utilized During Treatment: Left knee immobilizer Activity Tolerance: Patient tolerated treatment well Patient left: in bed;with call bell/phone within reach Nurse Communication: Mobility status  GP     Caley Ciaramitaro,[name] 04/29/2012, 3:50 PM Pager: [phone]

## 2012-04-29 NOTE — Interval H&P Note (Signed)
History and Physical Interval Note:  04/29/2012 6:41 AM  Hunter Santos  has presented today for surgery, with the diagnosis of Osteoarthritis of the Left Knee  The various methods of treatment have been discussed with the patient and family. After consideration of risks, benefits and other options for treatment, the patient has consented to  Procedure(s) (LRB) with comments: TOTAL KNEE ARTHROPLASTY (Left) as a surgical intervention .  The patient's history has been reviewed, patient examined, no change in status, stable for surgery.  I have reviewed the patient's chart and labs.  Questions were answered to the patient's satisfaction.     Shelda Pal

## 2012-04-29 NOTE — Anesthesia Preprocedure Evaluation (Addendum)
Anesthesia Evaluation  Patient identified by MRN, date of birth, ID band Patient awake    Reviewed: Allergy & Precautions, H&P , NPO status , Patient's Chart, lab work & pertinent test results  Airway Mallampati: II TM Distance: >3 FB Neck ROM: Full    Dental  (+) Dental Advisory Given and Teeth Intact   Pulmonary sleep apnea ,  breath sounds clear to auscultation  Pulmonary exam normal       Cardiovascular hypertension, Pt. on medications Rhythm:Regular Rate:Normal  RBBB on EKG   Neuro/Psych negative neurological ROS  negative psych ROS   GI/Hepatic negative GI ROS, Neg liver ROS,   Endo/Other  diabetes, Type 2, Oral Hypoglycemic Agents  Renal/GU negative Renal ROS     Musculoskeletal negative musculoskeletal ROS (+)   Abdominal   Peds  Hematology negative hematology ROS (+)   Anesthesia Other Findings   Reproductive/Obstetrics                         Anesthesia Physical Anesthesia Plan  ASA: II  Anesthesia Plan: Spinal   Post-op Pain Management:    Induction:   Airway Management Planned: Simple Face Mask  Additional Equipment:   Intra-op Plan:   Post-operative Plan:   Informed Consent: I have reviewed the patients History and Physical, chart, labs and discussed the procedure including the risks, benefits and alternatives for the proposed anesthesia with the patient or authorized representative who has indicated his/her understanding and acceptance.   Dental advisory given  Plan Discussed with: CRNA  Anesthesia Plan Comments:         Anesthesia Quick Evaluation

## 2012-04-30 ENCOUNTER — Encounter (HOSPITAL_COMMUNITY): Payer: Self-pay | Admitting: Orthopedic Surgery

## 2012-04-30 DIAGNOSIS — D62 Acute posthemorrhagic anemia: Secondary | ICD-10-CM

## 2012-04-30 DIAGNOSIS — E669 Obesity, unspecified: Secondary | ICD-10-CM

## 2012-04-30 DIAGNOSIS — E871 Hypo-osmolality and hyponatremia: Secondary | ICD-10-CM

## 2012-04-30 LAB — BASIC METABOLIC PANEL
CO2: 26 mEq/L (ref 19–32)
Calcium: 8.3 mg/dL — ABNORMAL LOW (ref 8.4–10.5)
GFR calc Af Amer: >90 mL/min (ref >90)
GFR calc non Af Amer: >90 mL/min (ref >90)
Glucose, Bld: 109 mg/dL — ABNORMAL HIGH (ref 70–99)
Potassium: 3.9 mEq/L (ref 3.5–5.1)
Sodium: 133 mEq/L — ABNORMAL LOW (ref 135–145)

## 2012-04-30 LAB — CBC
Hemoglobin: 11.8 g/dL — ABNORMAL LOW (ref 13.0–17.0)
MCH: 29.9 pg (ref 26.0–34.0)
Platelets: 221 10*3/uL (ref 150–400)
RBC: 3.94 MIL/uL — ABNORMAL LOW (ref 4.22–5.81)

## 2012-04-30 MED ORDER — POLYETHYLENE GLYCOL 3350 17 G PO PACK: 17.0000 g | PACK | Freq: Every day | ORAL | Status: DC | PRN

## 2012-04-30 MED ORDER — RAMIPRIL 10 MG PO CAPS
10.0000 mg | ORAL_CAPSULE | Freq: Every day | ORAL | Status: DC
  Administered 2012-04-30: 10 mg via ORAL
  Filled 2012-04-30: qty 1

## 2012-04-30 MED ORDER — METHOCARBAMOL 500 MG PO TABS: 500.0000 mg | ORAL_TABLET | Freq: Four times a day (QID) | ORAL | Status: DC | PRN

## 2012-04-30 MED ORDER — HYDROCODONE-ACETAMINOPHEN 7.5-325 MG PO TABS: 1.0000 | ORAL_TABLET | ORAL | Status: DC | PRN

## 2012-04-30 MED ORDER — FERROUS SULFATE 325 (65 FE) MG PO TABS: 325.0000 mg | ORAL_TABLET | Freq: Three times a day (TID) | ORAL | Status: DC

## 2012-04-30 MED ORDER — ASPIRIN EC 325 MG PO TBEC: 325.0000 mg | DELAYED_RELEASE_TABLET | Freq: Two times a day (BID) | ORAL | Status: DC

## 2012-04-30 MED ORDER — DSS 100 MG PO CAPS: 100.0000 mg | ORAL_CAPSULE | Freq: Two times a day (BID) | ORAL | Status: DC

## 2012-04-30 NOTE — Care Management Note (Signed)
    Page 1 of 2   04/30/2012     5:41:19 PM   CARE MANAGEMENT NOTE 04/30/2012  Patient:  Hunter Santos, Hunter Santos   Account Number:  [id number]  Date Initiated:  04/30/2012  Documentation initiated by:  Colleen Can  Subjective/Objective Assessment:   dx osteoarthritis left knee: total knee replacemnt  Pre-arranged with Genevieve Norlander     Action/Plan:   CM spoke with patient. Plans are for patient to return to his home in [place], [place] where spouse will be caregiver. Already has showrchair and BSC. Will need RW- [facility] will arrange   Anticipated DC Date:  05/02/2012   Anticipated DC Plan:  HOME W HOME HEALTH SERVICES  In-house referral  NA      DC Planning Services  CM consult      [facility] Choice  HOME HEALTH   Choice offered to / List presented to:  C-1 Patient   DME arranged  NA      DME agency  NA     HH arranged  HH-2 PT      [facility] agency  [facility]   Status of service:  Completed, signed off Medicare Important Message given?  NO (If response is "NO", the following Medicare IM given date fields will be blank) Date Medicare IM given:   Date Additional Medicare IM given:    Discharge Disposition:  HOME W HOME HEALTH SERVICES  Per UR Regulation:  Reviewed for med. necessity/level of care/duration of stay  If discussed at Long Length of Stay Meetings, dates discussed:    Comments:  04/30/2012 Raynelle Bring BSN CCM [phone] Pt discharged today with [facility] services in place. Genevieve Norlander will start hh services tomorrow 05/01/2012

## 2012-04-30 NOTE — Progress Notes (Signed)
Physical Therapy Treatment Patient Details Name: Hunter Santos MRN: [id number] DOB: 1951-06-03 Today's Date: 04/30/2012 Time: 4098-1191 PT Time Calculation (min): 26 min  PT Assessment / Plan / Recommendation Comments on Treatment Session  Pt ambulated in hallway and performed exercises.  Pt wished to practice stairs in afternoon.    Follow Up Recommendations  Home health PT     Does the patient have the potential to tolerate intense rehabilitation     Barriers to Discharge        Equipment Recommendations  Other (comment) (wheels for standard walker)    Recommendations for Other Services    Frequency     Plan Discharge plan remains appropriate;Frequency remains appropriate    Precautions / Restrictions Precautions Precautions: Knee Required Braces or Orthoses: Knee Immobilizer - Left Knee Immobilizer - Left: Discontinue once straight leg raise with < 10 degree lag Restrictions Weight Bearing Restrictions: No LLE Weight Bearing: Weight bearing as tolerated   Pertinent Vitals/Pain Pt reports sore L knee, premedicated, ice applied    Mobility  Transfers Transfers: Stand to Sit;Sit to Stand Sit to Stand: 4: Min guard;With upper extremity assist;From bed Stand to Sit: 4: Min assist;With upper extremity assist;To chair/3-in-1 Details for Transfer Assistance: verbal cues for safe technique Ambulation/Gait Ambulation/Gait Assistance: 4: Min guard Ambulation Distance (Feet): 60 Feet Assistive device: Rolling walker Ambulation/Gait Assistance Details: verbal cues for posture, step length Gait Pattern: Step-to pattern;Decreased stance time - left;Antalgic Gait velocity: decreased    Exercises Total Joint Exercises Ankle Circles/Pumps: AROM;Both;20 reps Quad Sets: AROM;Left;20 reps Short Arc Quad: AROM;Strengthening;Left;20 reps Heel Slides: AAROM;Left;15 reps Hip ABduction/ADduction: AROM;Strengthening;Left;15 reps Straight Leg Raises: AROM;Left;15 reps   PT Diagnosis:     PT Problem List:   PT Treatment Interventions:     PT Goals Acute Rehab PT Goals PT Goal: Sit to Stand - Progress: Progressing toward goal PT Goal: Stand to Sit - Progress: Progressing toward goal PT Goal: Ambulate - Progress: Progressing toward goal PT Goal: Perform Home Exercise Program - Progress: Progressing toward goal  Visit Information  Last PT Received On: 04/30/12 Assistance Needed: +1    Subjective Data  Subjective: I was feeling a little sick earlier but I'm doing okay now.   Cognition  Overall Cognitive Status: Appears within functional limits for tasks assessed/performed Arousal/Alertness: Awake/alert Orientation Level: Appears intact for tasks assessed Behavior During Session: [facility] for tasks performed    Balance     End of Session PT - End of Session Equipment Utilized During Treatment: Left knee immobilizer Activity Tolerance: Patient tolerated treatment well Patient left: in chair;with call bell/phone within reach   GP     [name],[name] 04/30/2012, 12:08 PM Pager: [phone]

## 2012-04-30 NOTE — Progress Notes (Signed)
    Subjective: 1 Day Post-Op Procedure(s) (LRB): TOTAL KNEE ARTHROPLASTY (Left)   Patient reports pain as mild, pain well controlled. No events throughout the night. If does well after PT he may be ready to go home.  Objective:   VITALS:   Filed Vitals:   04/30/12 0539  BP: 113/72  Pulse: 75  Temp: 97.8 F (36.6 C)  Resp: 16    Neurovascular intact Dorsiflexion/Plantar flexion intact Incision: dressing C/D/I No cellulitis present Compartment soft  LABS  Basename 04/30/12 0340  HGB 11.8*  HCT 35.5*  WBC 7.8  PLT 221     Basename 04/30/12 0340  NA 133*  K 3.9  BUN 9  CREATININE 0.79  GLUCOSE 109*     Assessment/Plan: 1 Day Post-Op Procedure(s) (LRB): TOTAL KNEE ARTHROPLASTY (Left) Advance diet Up with therapy D/C IV fluids Discharge home with home health if good after PT Follow up in 2 weeks at [facility]. Follow-up Information    Follow up with OLIN,Merced Hanners D in 2 weeks.   Contact information:   [facility] [address] [place] [address] [phone]         Expected ABLA  Treated with iron and will observe  Obese (BMI 30-39.9)  Estimated Body mass index is 35.44 kg/(m^2) as calculated from the following:   Height as of this encounter: 6\' 2" (1.88 m).   Weight as of this encounter: 276 lb(125.193 kg). Patient also counseled that weight may inhibit the healing process Patient counseled that losing weight will help with future health issues  Hyponatremia Treated with IV fluids and will observe        Anastasio Auerbach. [place] Fant   PAC  04/30/2012, 7:37 AM

## 2012-04-30 NOTE — Progress Notes (Signed)
Physical Therapy Treatment Note   04/30/12 1500  PT Visit Information  Last PT Received On 04/30/12  Assistance Needed +1  PT Time Calculation  PT Start Time 1415  PT Stop Time 1442  PT Time Calculation (min) 27 min  Subjective Data  Subjective pt doing heel slides EOC upon entering.  Precautions  Precautions Knee  Precaution Comments pt able to perform SLR  Restrictions  LLE Weight Bearing WBAT  Cognition  Overall Cognitive Status Appears within functional limits for tasks assessed/performed  Transfers  Transfers Stand to Sit;Sit to Stand  Sit to Stand 5: Supervision;From chair/3-in-1;With upper extremity assist;With armrests  Stand to Sit 5: Supervision;With upper extremity assist;To chair/3-in-1  Details for Transfer Assistance verbal cue for L LE forward  Ambulation/Gait  Ambulation/Gait Assistance 4: Min guard;5: Supervision  Ambulation Distance (Feet) 60 Feet  Assistive device Rolling walker  Ambulation/Gait Assistance Details verbal cues for rolling RW, step length  Gait Pattern Step-to pattern;Decreased stance time - left;Antalgic  Gait velocity decreased  Stairs Yes  Stairs Assistance 4: Min assist  Stairs Assistance Details (indicate cue type and reason) demonstrated technique prior to performing, verbal cues for sequence and safety, min assist first time then min/guard with second time and KI (pt felt more confident with KI for safety)  Stair Management Technique Backwards;Step to pattern;With walker  Number of Stairs 2  (x2)  PT - Assessment/Plan  Comments on Treatment Session Pt ambulated and performed stairs twice.  Pt did better with second performance of stairs due to wearing KI.  Pt given handout.  Spouse arrived after session however educated her on safe stair technique and she also practiced holding RW with rehab staff.  PT Plan Discharge plan remains appropriate;Frequency remains appropriate  Follow Up Recommendations Home health PT  Equipment Recommended  (RW delivered to room)  Acute Rehab PT Goals  PT Goal: Sit to Stand - Progress Progressing toward goal  PT Goal: Stand to Sit - Progress Progressing toward goal  PT Goal: Ambulate - Progress Progressing toward goal  PT Goal: Up/Down Stairs - Progress Progressing toward goal  PT General Charges  $$ ACUTE PT VISIT 1 Procedure  PT Treatments  $Gait Training 23-37 mins    Pain: 3-4/10, RN notified  Zenovia Jarred, PT Pager: [phone]

## 2012-04-30 NOTE — Progress Notes (Signed)
Referred to this CSW today for ?SNF. Chart reviewed and have spoken with RNCM and Care Coordinator who indicate patient plans to d/c home with HH and DME. CSW to sign off- please contact us if SW needs arise. Dakwan Pridgen, MSW, LCSWA [phone]  

## 2012-04-30 NOTE — Progress Notes (Signed)
SCIP - Anticoagulation  Xarelto due at 0815, AET was 0907  Spoke with Karin Golden, RN and made her aware of tight window to achieve SCIP measure.  Drug was not available on floor, medication was sent and given to RN at 07:55.  Thanks, Juliette Alcide, PharmD, BCPS.   Pager: [phone]

## 2012-04-30 NOTE — Evaluation (Signed)
Occupational Therapy Evaluation Patient Details Name: Hunter Santos MRN: [id number] DOB: Sep 10, 1950 Today's Date: 04/30/2012 Time: 4098-1191 OT Time Calculation (min): 26 min  OT Assessment / Plan / Recommendation Clinical Impression  Pt doing very well POD 1 LTKR. All education completed. Pt will have necessary level of A upon d/c and has all necessary DME.    OT Assessment  Patient does not need any further OT services    Follow Up Recommendations  No OT follow up    Barriers to Discharge      Equipment Recommendations  None recommended by OT    Recommendations for Other Services    Frequency       Precautions / Restrictions Precautions Precautions: Knee Required Braces or Orthoses: Knee Immobilizer - Left Knee Immobilizer - Left: Discontinue once straight leg raise with < 10 degree lag Restrictions Weight Bearing Restrictions: No LLE Weight Bearing: Weight bearing as tolerated   Pertinent Vitals/Pain Reported 5/10 pain. Repositioned for comfort.    ADL  Grooming: Simulated;Supervision/safety Where Assessed - Grooming: Unsupported standing Upper Body Bathing: Simulated;Set up Where Assessed - Upper Body Bathing: Unsupported sitting Lower Body Bathing: Simulated;Min guard Where Assessed - Lower Body Bathing: Supported sit to stand Upper Body Dressing: Simulated;Set up Where Assessed - Upper Body Dressing: Unsupported sitting Lower Body Dressing: Performed;Minimal assistance (for L sock.) Where Assessed - Lower Body Dressing: Supported sit to stand Toilet Transfer: Research scientist (life sciences) Method: Sit to Barista: Raised toilet seat with arms (or 3-in-1 over toilet) Toileting - Clothing Manipulation and Hygiene: Simulated;Supervision/safety Where Assessed - Toileting Clothing Manipulation and Hygiene: Sit to stand from 3-in-1 or toilet;Standing Equipment Used: Rolling walker Transfers/Ambulation Related to ADLs: Pt able to  ambulate to the bathroom with supervision. ADL Comments: Reviewed tub transfer with bench. Pt stated he recalled how to use from previous ankle fx. Pt able to don/doff pants without difficulty. Pt with good safety awareness.    OT Diagnosis:    OT Problem List:   OT Treatment Interventions:     OT Goals    Visit Information  Last OT Received On: 04/30/12 Assistance Needed: +1    Subjective Data  Subjective: I used one of those tub benches when I broke my ankle. Patient Stated Goal: Not asked.   Prior Functioning     Home Living Lives With: Spouse Available Help at Discharge: Family Type of Home: House Home Access: Stairs to enter Secretary/administrator of Steps: 3 Entrance Stairs-Rails: Right Home Layout: One level Bathroom Shower/Tub: Engineer, manufacturing systems: Standard Home Adaptive Equipment: Bedside commode/3-in-1;Tub transfer bench;Walker - rolling Prior Function Level of Independence: Independent Able to Take Stairs?: Yes Driving: Yes Vocation: Full time employment Communication Communication: No difficulties Dominant Hand: Right         Vision/Perception     Cognition  Overall Cognitive Status: Appears within functional limits for tasks assessed/performed Arousal/Alertness: Awake/alert Orientation Level: Appears intact for tasks assessed Behavior During Session: [facility] for tasks performed    Extremity/Trunk Assessment Right Upper Extremity Assessment RUE ROM/Strength/Tone: [facility] for tasks assessed Left Upper Extremity Assessment LUE ROM/Strength/Tone: WFL for tasks assessed     Mobility Transfers Sit to Stand: 5: Supervision;From chair/3-in-1;With upper extremity assist;With armrests Stand to Sit: 5: Supervision;With upper extremity assist;To chair/3-in-1 Details for Transfer Assistance: Min VCs for safe technique.     Shoulder Instructions     Exercise    Balance     End of Session OT - End of Session Activity Tolerance: Patient  tolerated treatment well Patient left: in chair;with call bell/phone within reach  GO     Maicie Vanderloop A OTR/L [phone] 04/30/2012, 12:18 PM

## 2012-05-01 LAB — GLUCOSE, CAPILLARY
Glucose-Capillary: 147 mg/dL — ABNORMAL HIGH (ref 70–99)
Glucose-Capillary: 226 mg/dL — ABNORMAL HIGH (ref 70–99)
Glucose-Capillary: 193 mg/dL — ABNORMAL HIGH (ref 70–99)

## 2012-05-01 NOTE — Discharge Summary (Signed)
Physician Discharge Summary  Patient ID: Hunter Santos MRN: [id number] DOB/AGE: 1950-11-03 61 y.o.  Admit date: 04/29/2012 Discharge date: 04/30/2012  Procedures:  Procedure(s) (LRB): TOTAL KNEE ARTHROPLASTY (Left)  Attending Physician:  Dr. Durene Romans   Admission Diagnoses:   Left knee OA and pain   Discharge Diagnoses:  Principal Problem:  *S/P left TKA Active Problems:  Expected blood loss anemia  Obese  Hyponatremia HTN  Sleep apnea  DM  Arthritis   HPI: Pt is a 61 y.o. male complaining of left knee pain for 7 years. He has had increasing pain in the left ankle and had surgery on the ankle in 2006. He has been doing well since that time, but the knee continues to increase in pain. Pain had continually increased since the beginning, especially over the last couple of months. It has been effecting him so significantly that he has had to change positions at his job, so that he will put less stress on knee. X-rays in the clinic show bone-on-bone end-stage arthritic changes of the left knee. Pt has tried various conservative treatments which have failed to alleviate their symptoms. Various options are discussed with the patient. Risks, benefits and expectations were discussed with the patient. Patient understand the risks, benefits and expectations and wishes to proceed with surgery.  PCP: No primary provider on file.   Discharged Condition: good  Hospital Course:  Patient underwent the above stated procedure on 04/29/2012. Patient tolerated the procedure well and brought to the recovery room in good condition and subsequently to the floor.  POD #1 BP: 113/72 ; Pulse: 75 ; Temp: 97.8 F (36.6 C) ; Resp: 16  Pt's foley was removed, as well as the hemovac drain removed. IV was changed to a saline lock. Patient reports pain as mild, pain well controlled. No events throughout the night. If does well after PT he may be ready to go home.  Neurovascular intact,  dorsiflexion/plantar flexion intact, incision: dressing C/D/I, no cellulitis present and compartment soft.   LABS  Basename  04/30/12 0340   HGB  11.8  HCT  35.5    Discharge Exam: General appearance: alert, cooperative and no distress Extremities: Homans sign is negative, no sign of DVT, no edema, redness or tenderness in the calves or thighs and no ulcers, gangrene or trophic changes  Disposition: Home-Health Care Svc with follow up in 2 weeks   Follow-up Information    Follow up with Shelda Pal, MD. In 2 weeks.   Contact information:   [facility] [address] [place] [address] [phone]          Discharge Orders    Future Orders Please Complete By Expires   Diet - low sodium heart healthy      Call MD / Call 911      Comments:   If you experience chest pain or shortness of breath, CALL 911 and be transported to the hospital emergency room.  If you develope a fever above 101 F, pus (white drainage) or increased drainage or redness at the wound, or calf pain, call your surgeon's office.   Discharge instructions      Comments:   Maintain surgical dressing for 8 days, then replace with gauze and tape. Keep the area dry and clean until follow up. Follow up in 2 weeks at [facility]. Call with any questions or concerns.   Constipation Prevention      Comments:   Drink plenty of fluids.  Prune juice may be helpful.  You may use a stool softener, such as Colace (over the counter) 100 mg twice a day.  Use MiraLax (over the counter) for constipation as needed.   Increase activity slowly as tolerated      TED hose      Comments:   Use stockings (TED hose) for 2 weeks on both leg(s).  You may remove them at night for sleeping.   Change dressing      Comments:   Maintain surgical dressing for 8 days, then change the dressing daily with sterile 4 x 4 inch gauze dressing and tape. Keep the area dry and clean.      Discharge  Medication List as of 04/30/2012  3:08 PM    START taking these medications   Details  docusate sodium 100 MG CAPS Take 100 mg by mouth 2 (two) times daily., Starting 04/30/2012, Until Discontinued, No Print    polyethylene glycol (MIRALAX / GLYCOLAX) packet Take 17 g by mouth daily as needed., Starting 04/30/2012, Until Discontinued, No Print      CONTINUE these medications which have CHANGED   Details  aspirin EC 325 MG tablet Take 1 tablet (325 mg total) by mouth 2 (two) times daily. X 4 weeks, Starting 04/30/2012, Until Discontinued, No Print    ferrous sulfate 325 (65 FE) MG tablet Take 1 tablet (325 mg total) by mouth 3 (three) times daily after meals., Starting 04/30/2012, Until Discontinued, No Print    HYDROcodone-acetaminophen (NORCO) 7.5-325 MG per tablet Take 1-2 tablets by mouth every 4 (four) hours as needed for pain., Starting 04/30/2012, Until Discontinued, Print    methocarbamol (ROBAXIN) 500 MG tablet Take 1 tablet (500 mg total) by mouth every 6 (six) hours as needed., Starting 04/30/2012, Until Discontinued, No Print      CONTINUE these medications which have NOT CHANGED   Details  amLODipine (NORVASC) 10 MG tablet Take 10 mg by mouth daily before breakfast., Until Discontinued, Historical Med    furosemide (LASIX) 40 MG tablet Take 40 mg by mouth daily before breakfast., Until Discontinued, Historical Med    glimepiride (AMARYL) 4 MG tablet Take 4 mg by mouth daily before breakfast., Until Discontinued, Historical Med    metFORMIN (GLUCOPHAGE) 1000 MG tablet Take 1,000 mg by mouth 2 (two) times daily with a meal., Until Discontinued, Historical Med    pravastatin (PRAVACHOL) 40 MG tablet Take 40 mg by mouth every evening., Until Discontinued, Historical Med    ramipril (ALTACE) 10 MG tablet Take 10 mg by mouth daily before breakfast., Until Discontinued, Historical Med         Signed: Anastasio Auerbach. Dreyson Mishkin   PAC  05/01/2012, 10:17 AM

## 2013-04-09 ENCOUNTER — Other Ambulatory Visit (HOSPITAL_COMMUNITY): Payer: Self-pay | Admitting: Orthopedic Surgery

## 2013-04-09 DIAGNOSIS — M25562 Pain in left knee: Secondary | ICD-10-CM

## 2013-04-14 ENCOUNTER — Encounter (HOSPITAL_COMMUNITY)
Admission: RE | Admit: 2013-04-14 | Discharge: 2013-04-14 | Disposition: A | Discharge status: Home / Self Care | Payer: BC Managed Care – PPO | Source: Ambulatory Visit | Attending: Orthopedic Surgery | Admitting: Orthopedic Surgery

## 2013-04-14 DIAGNOSIS — M25569 Pain in unspecified knee: Secondary | ICD-10-CM | POA: Insufficient documentation

## 2013-04-14 DIAGNOSIS — M25562 Pain in left knee: Secondary | ICD-10-CM

## 2013-04-14 MED ORDER — TECHNETIUM TC 99M MEDRONATE IV KIT
25.0000 | PACK | Freq: Once | INTRAVENOUS | Status: AC | PRN
  Administered 2013-04-14: 25 via INTRAVENOUS

## 2013-04-16 ENCOUNTER — Other Ambulatory Visit (HOSPITAL_COMMUNITY): Payer: BC Managed Care – PPO

## 2013-04-16 ENCOUNTER — Encounter (HOSPITAL_COMMUNITY): Payer: BC Managed Care – PPO

## 2013-05-27 NOTE — Progress Notes (Signed)
MATT-  WHEN YOU CAN WE NEED PRE OP ORDERS   THANK YOU

## 2013-06-01 ENCOUNTER — Encounter (HOSPITAL_COMMUNITY): Payer: Self-pay | Admitting: Pharmacy Technician

## 2013-06-02 ENCOUNTER — Other Ambulatory Visit (HOSPITAL_COMMUNITY): Payer: Self-pay | Admitting: Orthopedic Surgery

## 2013-06-02 NOTE — Patient Instructions (Signed)
[address]  06/02/2013   Your procedure is scheduled on:  06/15/13  MONDAY  Report to [facility] at  0700     AM.  Call this number if you have problems the morning of surgery: [phone]       Remember:   Do not eat food  Or drink :After Midnight. Sunday NIGHT   Take these medicines the morning of surgery with A SIP OF WATER: AMLODIPINE DO NOT TAKE ANY DIABETES MEDICINE MORNING OF SURGERY  .  Contacts, dentures or partial plates can not be worn to surgery  Leave suitcase in the car. After surgery it may be brought to your room.  For patients admitted to the hospital, checkout time is 11:00 AM day of  discharge.             SPECIAL INSTRUCTIONS- SEE [place] PREPARING FOR SURGERY INSTRUCTION SHEET-     DO NOT WEAR JEWELRY, LOTIONS, POWDERS, OR PERFUMES.  WOMEN-- DO NOT SHAVE LEGS OR UNDERARMS FOR 12 HOURS BEFORE SHOWERS. MEN MAY SHAVE FACE.  Patients discharged the day of surgery will not be allowed to drive home. IF going home the day of surgery, you must have a driver and someone to stay with you for the first 24 hours  Name and phone number of your driver:      admission                                                                  Please read over the following fact sheets that you were given: MRSA Information, Incentive Spirometry Sheet, Blood Transfusion Sheet  Information                                                                                 I AM AWARE THAT BLOOD WILL BE DRAWN MORNING OF SURGERY FOR MY BLOOD TYPE AND SCREEN  Anecia Nusbaum  PST [phone]  [phone]                 FAILURE TO FOLLOW THESE INSTRUCTIONS MAY RESULT IN  CANCELLATION   OF YOUR SURGERY                                                  Patient Signature _____________________________

## 2013-06-02 NOTE — Progress Notes (Signed)
Chest x ray, EKG 11/14 Chart,   CBC, CMP report 05/25/13 chart

## 2013-06-03 ENCOUNTER — Encounter (HOSPITAL_COMMUNITY)
Admission: RE | Admit: 2013-06-03 | Discharge: 2013-06-03 | Disposition: A | Discharge status: Home / Self Care | Payer: BC Managed Care – PPO | Source: Ambulatory Visit | Attending: Orthopedic Surgery | Admitting: Orthopedic Surgery

## 2013-06-03 ENCOUNTER — Encounter (HOSPITAL_COMMUNITY): Payer: Self-pay

## 2013-06-03 DIAGNOSIS — Z01818 Encounter for other preprocedural examination: Secondary | ICD-10-CM | POA: Insufficient documentation

## 2013-06-03 DIAGNOSIS — Z01812 Encounter for preprocedural laboratory examination: Secondary | ICD-10-CM | POA: Insufficient documentation

## 2013-06-03 HISTORY — DX: Unspecified osteoarthritis, unspecified site: M19.90

## 2013-06-03 LAB — SURGICAL PCR SCREEN
MRSA, PCR: NEGATIVE
Staphylococcus aureus: NEGATIVE

## 2013-06-03 LAB — PROTIME-INR: Prothrombin Time: 13 seconds (ref 11.6–15.2)

## 2013-06-03 LAB — URINALYSIS, ROUTINE W REFLEX MICROSCOPIC
Bilirubin Urine: NEGATIVE
Hgb urine dipstick: NEGATIVE
Nitrite: NEGATIVE
Protein, ur: NEGATIVE mg/dL
Specific Gravity, Urine: 1.03 (ref 1.005–1.030)
Urobilinogen, UA: 0.2 mg/dL (ref 0.0–1.0)
pH: 5.5 (ref 5.0–8.0)

## 2013-06-03 LAB — APTT: aPTT: 32 seconds (ref 24–37)

## 2013-06-03 NOTE — Progress Notes (Signed)
Faxed u/a to Dr Charlann Boxer through [facility]

## 2013-06-04 NOTE — Progress Notes (Signed)
Received clearance Dr Juleen China, placed on chart.   Dr Otilio Carpen records say Hunter Santos-  Patient did confirm this is him

## 2013-06-14 NOTE — H&P (Signed)
Hunter Santos is an 62 y.o. male.    Chief Complaint:   Infected left total knee arthroplasty  Procedure:   Resection of left total knee arthroplasty   HPI: Pt is a 62 y.o. male who is extremely pleasant. He recently presented to the clinic after doing initially exceptionally well from a knee replacement done a year ago (04/29/2012). He had no problems with wound issues. Initially he had no problems postoperative course and had been doing very well until recently.  He noted pain and swelling and returned to the clinic about 1 month ago, Hunter Santos aspirated his knee at that time. On work up C-reactive protein of 8.4. On aspiration he was noted to have 55,000 white cells with 95% neutrophils. These are all findings consistent with infection.   Unfortunately, for Hunter Santos the etiology of this is uncertain. He had some dental work recently, but used antibiotic prophylaxis for two days.Dr. Charlann Boxer discussed the whole 2 stage process with the patient and he understands. Risks, benefits and expectations were discussed with the patient.  Risks including but not limited to the risk of anesthesia, blood clots, nerve damage, blood vessel damage, failure of the prosthesis, infection and up to and including death.  Patient understand the risks, benefits and expectations and wishes to proceed with surgery.   PCP:  Michiel Sites, MD  D/C Plans:  Home with HHPT/SNF  Post-op Meds:    No Rx given   FYI:     ASA post-op  PICC line  ID Consult  PMH: Past Medical History  Diagnosis Date  . Hypertension   . Diabetes mellitus   . Sleep apnea     had surgery for deviated septum previously  . Arthritis     PSH: Past Surgical History  Procedure Laterality Date  . Tonsillectomy      as child  . Left ankle  2006  . Nasal septum surgery  2011  . Total knee arthroplasty  04/29/2012    Procedure: TOTAL KNEE ARTHROPLASTY;  Surgeon: Shelda Pal, MD;  Location: [facility];  Service:  Orthopedics;  Laterality: Left;    Social History:  reports that he has never smoked. He has never used smokeless tobacco. He reports that he drinks alcohol. He reports that he does not use illicit drugs.  Allergies:  No Known Allergies  Medications: No current facility-administered medications for this encounter.   Current Outpatient Prescriptions  Medication Sig Dispense Refill  . amLODipine (NORVASC) 10 MG tablet Take 10 mg by mouth daily before breakfast.      . aspirin EC 81 MG tablet Take 81 mg by mouth daily.      . furosemide (LASIX) 40 MG tablet Take 40 mg by mouth daily before breakfast.      . glimepiride (AMARYL) 4 MG tablet Take 4 mg by mouth daily before breakfast.      . meloxicam (MOBIC) 15 MG tablet Take 15 mg by mouth daily.      . metFORMIN (GLUCOPHAGE) 1000 MG tablet Take 1,000 mg by mouth 2 (two) times daily with a meal.      . pravastatin (PRAVACHOL) 40 MG tablet Take 40 mg by mouth every evening.      . ramipril (ALTACE) 10 MG tablet Take 10 mg by mouth daily before breakfast.          Review of Systems  Constitutional: Negative.   HENT: Negative.   Eyes: Negative.   Respiratory: Negative.   Cardiovascular: Negative.  Gastrointestinal: Negative.   Genitourinary: Negative.   Musculoskeletal: Positive for joint pain.  Skin: Negative.   Neurological: Negative.   Endo/Heme/Allergies: Negative.   Psychiatric/Behavioral: Negative.       Physical Exam  Constitutional: He is oriented to person, place, and time. He appears well-developed and well-nourished.  HENT:  Head: Normocephalic and atraumatic.  Mouth/Throat: Oropharynx is clear and moist.  Eyes: Pupils are equal, round, and reactive to light.  Neck: Neck supple. No JVD present. No tracheal deviation present. No thyromegaly present.  Cardiovascular: Normal rate, regular rhythm, normal heart sounds and intact distal pulses.   Respiratory: Effort normal and breath sounds normal. No stridor. No  respiratory distress. He has no wheezes.  GI: Soft. There is no tenderness. There is no guarding.  Musculoskeletal:       Left knee: He exhibits decreased range of motion, swelling, effusion, laceration (healed) and bony tenderness. He exhibits no ecchymosis and no deformity. Tenderness found.  Lymphadenopathy:    He has no cervical adenopathy.  Neurological: He is alert and oriented to person, place, and time.  Skin: Skin is warm and dry.  Psychiatric: He has a normal mood and affect.     Assessment/Plan Assessment:     Infected left total knee arthroplasty   Plan: Patient will undergo a resection of the left total knee arthroplasty on 06/15/2013 per Dr. Charlann Boxer at [facility]. Risks benefits and expectations were discussed with the patient. Patient understand risks, benefits and expectations and wishes to proceed.   [name]   PAC  06/14/2013, 9:56 PM

## 2013-06-15 ENCOUNTER — Encounter (HOSPITAL_COMMUNITY): Payer: Self-pay | Admitting: *Deleted

## 2013-06-15 ENCOUNTER — Inpatient Hospital Stay (HOSPITAL_COMMUNITY)
Admission: RE | Admit: 2013-06-15 | Discharge: 2013-06-18 | DRG: 464 | Disposition: A | Discharge status: Home Health | Payer: BC Managed Care – PPO | Source: Ambulatory Visit | Attending: Orthopedic Surgery | Admitting: Orthopedic Surgery

## 2013-06-15 ENCOUNTER — Encounter (HOSPITAL_COMMUNITY): Payer: BC Managed Care – PPO | Admitting: Anesthesiology

## 2013-06-15 ENCOUNTER — Inpatient Hospital Stay (HOSPITAL_COMMUNITY): Payer: BC Managed Care – PPO | Admitting: Anesthesiology

## 2013-06-15 ENCOUNTER — Encounter (HOSPITAL_COMMUNITY)
Admission: RE | Disposition: A | Discharge status: Home Health | Payer: Self-pay | Source: Ambulatory Visit | Attending: Orthopedic Surgery

## 2013-06-15 DIAGNOSIS — M659 Unspecified synovitis and tenosynovitis, unspecified site: Secondary | ICD-10-CM | POA: Diagnosis present

## 2013-06-15 DIAGNOSIS — Z01812 Encounter for preprocedural laboratory examination: Secondary | ICD-10-CM

## 2013-06-15 DIAGNOSIS — Z6833 Body mass index (BMI) 33.0-33.9, adult: Secondary | ICD-10-CM

## 2013-06-15 DIAGNOSIS — Y831 Surgical operation with implant of artificial internal device as the cause of abnormal reaction of the patient, or of later complication, without mention of misadventure at the time of the procedure: Secondary | ICD-10-CM | POA: Diagnosis present

## 2013-06-15 DIAGNOSIS — E871 Hypo-osmolality and hyponatremia: Secondary | ICD-10-CM | POA: Diagnosis present

## 2013-06-15 DIAGNOSIS — D62 Acute posthemorrhagic anemia: Secondary | ICD-10-CM | POA: Diagnosis present

## 2013-06-15 DIAGNOSIS — T8450XA Infection and inflammatory reaction due to unspecified internal joint prosthesis, initial encounter: Principal | ICD-10-CM | POA: Diagnosis present

## 2013-06-15 DIAGNOSIS — I10 Essential (primary) hypertension: Secondary | ICD-10-CM | POA: Diagnosis present

## 2013-06-15 DIAGNOSIS — Z89529 Acquired absence of unspecified knee: Secondary | ICD-10-CM | POA: Diagnosis not present

## 2013-06-15 DIAGNOSIS — G4733 Obstructive sleep apnea (adult) (pediatric): Secondary | ICD-10-CM | POA: Diagnosis present

## 2013-06-15 DIAGNOSIS — T8454XA Infection and inflammatory reaction due to internal left knee prosthesis, initial encounter: Secondary | ICD-10-CM

## 2013-06-15 DIAGNOSIS — Z7982 Long term (current) use of aspirin: Secondary | ICD-10-CM

## 2013-06-15 DIAGNOSIS — E119 Type 2 diabetes mellitus without complications: Secondary | ICD-10-CM | POA: Diagnosis present

## 2013-06-15 DIAGNOSIS — E669 Obesity, unspecified: Secondary | ICD-10-CM | POA: Diagnosis present

## 2013-06-15 DIAGNOSIS — M129 Arthropathy, unspecified: Secondary | ICD-10-CM | POA: Diagnosis present

## 2013-06-15 DIAGNOSIS — T8454XD Infection and inflammatory reaction due to internal left knee prosthesis, subsequent encounter: Secondary | ICD-10-CM

## 2013-06-15 HISTORY — PX: EXCISIONAL TOTAL KNEE ARTHROPLASTY: SHX5015

## 2013-06-15 HISTORY — DX: Infection and inflammatory reaction due to internal left knee prosthesis, initial encounter: T84.54XA

## 2013-06-15 LAB — GLUCOSE, CAPILLARY
Glucose-Capillary: 159 mg/dL — ABNORMAL HIGH (ref 70–99)
Glucose-Capillary: 274 mg/dL — ABNORMAL HIGH (ref 70–99)

## 2013-06-15 LAB — GRAM STAIN

## 2013-06-15 SURGERY — EXCISIONAL TOTAL KNEE ARTHROPLASTY
Anesthesia: Spinal | Site: Knee | Laterality: Left | Wound class: Dirty or Infected

## 2013-06-15 MED ORDER — PHENOL 1.4 % MT LIQD: 1.0000 | OROMUCOSAL | Status: DC | PRN

## 2013-06-15 MED ORDER — ONDANSETRON HCL 4 MG PO TABS
4.0000 mg | ORAL_TABLET | Freq: Four times a day (QID) | ORAL | Status: DC | PRN
  Filled 2013-06-15: qty 1

## 2013-06-15 MED ORDER — ONDANSETRON HCL 4 MG/2ML IJ SOLN
INTRAMUSCULAR | Status: AC
  Filled 2013-06-15: qty 2

## 2013-06-15 MED ORDER — SODIUM CHLORIDE 0.9 % IJ SOLN
INTRAMUSCULAR | Status: AC
  Filled 2013-06-15: qty 50

## 2013-06-15 MED ORDER — HYDROMORPHONE HCL PF 1 MG/ML IJ SOLN: 0.2500 mg | INTRAMUSCULAR | Status: DC | PRN

## 2013-06-15 MED ORDER — SODIUM CHLORIDE 0.9 % IV SOLN
1000.0000 mg | Freq: Once | INTRAVENOUS | Status: AC
  Administered 2013-06-15: 1000 mg via INTRAVENOUS
  Filled 2013-06-15: qty 10

## 2013-06-15 MED ORDER — BUPIVACAINE LIPOSOME 1.3 % IJ SUSP
20.0000 mL | Freq: Once | INTRAMUSCULAR | Status: DC
  Filled 2013-06-15: qty 20

## 2013-06-15 MED ORDER — PROPOFOL 10 MG/ML IV BOLUS
INTRAVENOUS | Status: AC
  Filled 2013-06-15: qty 20

## 2013-06-15 MED ORDER — DOCUSATE SODIUM 100 MG PO CAPS
100.0000 mg | ORAL_CAPSULE | Freq: Two times a day (BID) | ORAL | Status: DC
  Administered 2013-06-15 – 2013-06-18 (×6): 100 mg via ORAL

## 2013-06-15 MED ORDER — DEXAMETHASONE SODIUM PHOSPHATE 10 MG/ML IJ SOLN
INTRAMUSCULAR | Status: AC
  Filled 2013-06-15: qty 1

## 2013-06-15 MED ORDER — VANCOMYCIN HCL 1000 MG IV SOLR
INTRAVENOUS | Status: AC
  Filled 2013-06-15: qty 4000

## 2013-06-15 MED ORDER — INSULIN ASPART 100 UNIT/ML ~~LOC~~ SOLN
0.0000 [IU] | Freq: Three times a day (TID) | SUBCUTANEOUS | Status: DC
  Administered 2013-06-15: 17:00:00 via SUBCUTANEOUS
  Administered 2013-06-16 (×2): 3 [IU] via SUBCUTANEOUS
  Administered 2013-06-16 – 2013-06-18 (×4): 2 [IU] via SUBCUTANEOUS

## 2013-06-15 MED ORDER — VANCOMYCIN HCL 1000 MG IV SOLR
INTRAVENOUS | Status: DC | PRN
  Administered 2013-06-15: 4000 mg

## 2013-06-15 MED ORDER — HYDROMORPHONE HCL PF 1 MG/ML IJ SOLN
0.5000 mg | INTRAMUSCULAR | Status: DC | PRN
  Administered 2013-06-15 (×2): 1 mg via INTRAVENOUS
  Administered 2013-06-15: 0.5 mg via INTRAVENOUS
  Filled 2013-06-15 (×3): qty 1

## 2013-06-15 MED ORDER — BISACODYL 10 MG RE SUPP: 10.0000 mg | Freq: Every day | RECTAL | Status: DC | PRN

## 2013-06-15 MED ORDER — METHOCARBAMOL 100 MG/ML IJ SOLN
500.0000 mg | Freq: Four times a day (QID) | INTRAVENOUS | Status: DC | PRN
  Administered 2013-06-15: 14:00:00 500 mg via INTRAVENOUS
  Filled 2013-06-15: qty 5

## 2013-06-15 MED ORDER — OXYCODONE HCL 5 MG PO TABS: 5.0000 mg | ORAL_TABLET | Freq: Once | ORAL | Status: DC | PRN

## 2013-06-15 MED ORDER — MIDAZOLAM HCL 5 MG/5ML IJ SOLN
INTRAMUSCULAR | Status: DC | PRN
  Administered 2013-06-15: 2 mg via INTRAVENOUS

## 2013-06-15 MED ORDER — GLIMEPIRIDE 4 MG PO TABS
4.0000 mg | ORAL_TABLET | Freq: Every day | ORAL | Status: DC
  Administered 2013-06-16 – 2013-06-18 (×3): 4 mg via ORAL
  Filled 2013-06-15 (×4): qty 1

## 2013-06-15 MED ORDER — ALUM & MAG HYDROXIDE-SIMETH 200-200-20 MG/5ML PO SUSP: 30.0000 mL | ORAL | Status: DC | PRN

## 2013-06-15 MED ORDER — CELECOXIB 200 MG PO CAPS
200.0000 mg | ORAL_CAPSULE | Freq: Two times a day (BID) | ORAL | Status: DC
  Administered 2013-06-15 – 2013-06-18 (×7): 200 mg via ORAL
  Filled 2013-06-15 (×8): qty 1

## 2013-06-15 MED ORDER — KETOROLAC TROMETHAMINE 30 MG/ML IJ SOLN
INTRAMUSCULAR | Status: AC
  Filled 2013-06-15: qty 1

## 2013-06-15 MED ORDER — SODIUM CHLORIDE 0.9 % IV SOLN
INTRAVENOUS | Status: DC
  Administered 2013-06-15 – 2013-06-16 (×3): via INTRAVENOUS
  Filled 2013-06-15 (×14): qty 1000

## 2013-06-15 MED ORDER — SODIUM CHLORIDE 0.9 % IR SOLN
Status: DC | PRN
  Administered 2013-06-15: 3000 mL

## 2013-06-15 MED ORDER — ONDANSETRON HCL 4 MG/2ML IJ SOLN: 4.0000 mg | Freq: Four times a day (QID) | INTRAMUSCULAR | Status: DC | PRN

## 2013-06-15 MED ORDER — VANCOMYCIN HCL IN DEXTROSE 1-5 GM/200ML-% IV SOLN
INTRAVENOUS | Status: AC
  Filled 2013-06-15: qty 200

## 2013-06-15 MED ORDER — FLEET ENEMA 7-19 GM/118ML RE ENEM: 1.0000 | ENEMA | Freq: Once | RECTAL | Status: AC | PRN

## 2013-06-15 MED ORDER — CEFAZOLIN SODIUM-DEXTROSE 2-3 GM-% IV SOLR
2.0000 g | INTRAVENOUS | Status: AC
  Administered 2013-06-15: 2 g via INTRAVENOUS

## 2013-06-15 MED ORDER — MENTHOL 3 MG MT LOZG: 1.0000 | LOZENGE | OROMUCOSAL | Status: DC | PRN

## 2013-06-15 MED ORDER — DIPHENHYDRAMINE HCL 25 MG PO CAPS: 25.0000 mg | ORAL_CAPSULE | Freq: Four times a day (QID) | ORAL | Status: DC | PRN

## 2013-06-15 MED ORDER — SODIUM CHLORIDE 0.9 % IR SOLN
Status: DC | PRN
  Administered 2013-06-15: 1000 mL

## 2013-06-15 MED ORDER — MEPERIDINE HCL 50 MG/ML IJ SOLN: 6.2500 mg | INTRAMUSCULAR | Status: DC | PRN

## 2013-06-15 MED ORDER — TOBRAMYCIN SULFATE 1.2 G IJ SOLR
INTRAMUSCULAR | Status: AC
  Filled 2013-06-15: qty 4.8

## 2013-06-15 MED ORDER — ASPIRIN EC 325 MG PO TBEC
325.0000 mg | DELAYED_RELEASE_TABLET | Freq: Two times a day (BID) | ORAL | Status: DC
  Administered 2013-06-16 – 2013-06-18 (×5): 325 mg via ORAL
  Filled 2013-06-15 (×7): qty 1

## 2013-06-15 MED ORDER — CEFAZOLIN SODIUM-DEXTROSE 2-3 GM-% IV SOLR
INTRAVENOUS | Status: AC
  Filled 2013-06-15: qty 50

## 2013-06-15 MED ORDER — AMLODIPINE BESYLATE 10 MG PO TABS
10.0000 mg | ORAL_TABLET | Freq: Every day | ORAL | Status: DC
  Administered 2013-06-17 – 2013-06-18 (×2): 10 mg via ORAL
  Filled 2013-06-15 (×4): qty 1

## 2013-06-15 MED ORDER — SIMVASTATIN 20 MG PO TABS
20.0000 mg | ORAL_TABLET | Freq: Every day | ORAL | Status: DC
  Administered 2013-06-15 – 2013-06-17 (×3): 20 mg via ORAL
  Filled 2013-06-15 (×4): qty 1

## 2013-06-15 MED ORDER — METFORMIN HCL 500 MG PO TABS
1000.0000 mg | ORAL_TABLET | Freq: Two times a day (BID) | ORAL | Status: DC
  Administered 2013-06-15 – 2013-06-18 (×6): 1000 mg via ORAL
  Filled 2013-06-15 (×8): qty 2

## 2013-06-15 MED ORDER — METOCLOPRAMIDE HCL 10 MG PO TABS: 5.0000 mg | ORAL_TABLET | Freq: Three times a day (TID) | ORAL | Status: DC | PRN

## 2013-06-15 MED ORDER — TOBRAMYCIN SULFATE 1.2 G IJ SOLR
INTRAMUSCULAR | Status: DC | PRN
  Administered 2013-06-15: 4

## 2013-06-15 MED ORDER — OXYCODONE HCL 5 MG/5ML PO SOLN
5.0000 mg | Freq: Once | ORAL | Status: DC | PRN
  Filled 2013-06-15: qty 5

## 2013-06-15 MED ORDER — BUPIVACAINE IN DEXTROSE 0.75-8.25 % IT SOLN
INTRATHECAL | Status: DC | PRN
  Administered 2013-06-15: 2 mL via INTRATHECAL

## 2013-06-15 MED ORDER — ONDANSETRON HCL 4 MG/2ML IJ SOLN
INTRAMUSCULAR | Status: DC | PRN
  Administered 2013-06-15: 4 mg via INTRAVENOUS

## 2013-06-15 MED ORDER — PROMETHAZINE HCL 25 MG/ML IJ SOLN: 6.2500 mg | INTRAMUSCULAR | Status: DC | PRN

## 2013-06-15 MED ORDER — HYDROCODONE-ACETAMINOPHEN 7.5-325 MG PO TABS
1.0000 | ORAL_TABLET | ORAL | Status: DC
  Administered 2013-06-15: 20:00:00 2 via ORAL
  Administered 2013-06-15 (×2): 1 via ORAL
  Administered 2013-06-15 – 2013-06-17 (×7): 2 via ORAL
  Administered 2013-06-17 (×3): 1 via ORAL
  Administered 2013-06-17: 05:00:00 2 via ORAL
  Administered 2013-06-18 (×3): 1 via ORAL
  Filled 2013-06-15: qty 2
  Filled 2013-06-15 (×2): qty 1
  Filled 2013-06-15: qty 2
  Filled 2013-06-15: qty 1
  Filled 2013-06-15: qty 2
  Filled 2013-06-15: qty 1
  Filled 2013-06-15 (×3): qty 2
  Filled 2013-06-15 (×2): qty 1
  Filled 2013-06-15 (×5): qty 2

## 2013-06-15 MED ORDER — METOCLOPRAMIDE HCL 5 MG/ML IJ SOLN: 5.0000 mg | Freq: Three times a day (TID) | INTRAMUSCULAR | Status: DC | PRN

## 2013-06-15 MED ORDER — CEFAZOLIN SODIUM-DEXTROSE 2-3 GM-% IV SOLR
2.0000 g | Freq: Four times a day (QID) | INTRAVENOUS | Status: AC
  Administered 2013-06-15 (×2): 2 g via INTRAVENOUS
  Filled 2013-06-15 (×2): qty 50

## 2013-06-15 MED ORDER — FERROUS SULFATE 325 (65 FE) MG PO TABS
325.0000 mg | ORAL_TABLET | Freq: Three times a day (TID) | ORAL | Status: DC
  Administered 2013-06-15 – 2013-06-17 (×7): 325 mg via ORAL
  Filled 2013-06-15 (×11): qty 1

## 2013-06-15 MED ORDER — FENTANYL CITRATE 0.05 MG/ML IJ SOLN
INTRAMUSCULAR | Status: AC
  Filled 2013-06-15: qty 2

## 2013-06-15 MED ORDER — ZOLPIDEM TARTRATE 5 MG PO TABS: 5.0000 mg | ORAL_TABLET | Freq: Every evening | ORAL | Status: DC | PRN

## 2013-06-15 MED ORDER — LACTATED RINGERS IV SOLN
INTRAVENOUS | Status: DC
  Administered 2013-06-15: 1000 mL via INTRAVENOUS
  Administered 2013-06-15 (×2): via INTRAVENOUS

## 2013-06-15 MED ORDER — FUROSEMIDE 40 MG PO TABS
40.0000 mg | ORAL_TABLET | Freq: Every day | ORAL | Status: DC
  Administered 2013-06-16 – 2013-06-18 (×3): 40 mg via ORAL
  Filled 2013-06-15 (×4): qty 1

## 2013-06-15 MED ORDER — VANCOMYCIN HCL 1000 MG IV SOLR
1000.0000 mg | INTRAVENOUS | Status: DC | PRN
  Administered 2013-06-15: 1000 mg via INTRAVENOUS

## 2013-06-15 MED ORDER — DEXAMETHASONE SODIUM PHOSPHATE 10 MG/ML IJ SOLN
INTRAMUSCULAR | Status: DC | PRN
  Administered 2013-06-15: 10 mg via INTRAVENOUS

## 2013-06-15 MED ORDER — 0.9 % SODIUM CHLORIDE (POUR BTL) OPTIME
TOPICAL | Status: DC | PRN
  Administered 2013-06-15: 1000 mL

## 2013-06-15 MED ORDER — FENTANYL CITRATE 0.05 MG/ML IJ SOLN
INTRAMUSCULAR | Status: DC | PRN
  Administered 2013-06-15: 100 ug via INTRAVENOUS

## 2013-06-15 MED ORDER — MIDAZOLAM HCL 2 MG/2ML IJ SOLN
INTRAMUSCULAR | Status: AC
  Filled 2013-06-15: qty 2

## 2013-06-15 MED ORDER — POLYETHYLENE GLYCOL 3350 17 G PO PACK
17.0000 g | PACK | Freq: Two times a day (BID) | ORAL | Status: DC
  Administered 2013-06-16 – 2013-06-18 (×5): 17 g via ORAL

## 2013-06-15 MED ORDER — METHOCARBAMOL 500 MG PO TABS
500.0000 mg | ORAL_TABLET | Freq: Four times a day (QID) | ORAL | Status: DC | PRN
  Administered 2013-06-15 – 2013-06-18 (×4): 500 mg via ORAL
  Filled 2013-06-15 (×4): qty 1

## 2013-06-15 MED ORDER — PROPOFOL INFUSION 10 MG/ML OPTIME
INTRAVENOUS | Status: DC | PRN
  Administered 2013-06-15: 100 ug/kg/min via INTRAVENOUS

## 2013-06-15 MED ORDER — BUPIVACAINE-EPINEPHRINE 0.25% -1:200000 IJ SOLN
INTRAMUSCULAR | Status: AC
  Filled 2013-06-15: qty 1

## 2013-06-15 SURGICAL SUPPLY — 59 items
BAG SPEC THK2 15X12 ZIP CLS (MISCELLANEOUS) ×1
BAG ZIPLOCK 12X15 (MISCELLANEOUS) ×2 IMPLANT
BANDAGE ELASTIC 6 VELCRO ST LF (GAUZE/BANDAGES/DRESSINGS) ×2 IMPLANT
BANDAGE ESMARK 6X9 LF (GAUZE/BANDAGES/DRESSINGS) ×1 IMPLANT
BLADE SAW SGTL 13.0X1.19X90.0M (BLADE) ×2 IMPLANT
BLADE SAW SGTL 81X20 HD (BLADE) ×2 IMPLANT
BNDG CMPR 9X6 STRL LF SNTH (GAUZE/BANDAGES/DRESSINGS) ×1
BNDG ESMARK 6X9 LF (GAUZE/BANDAGES/DRESSINGS) ×2
BOWL SMART MIX CTS (DISPOSABLE) IMPLANT
CEMENT HV SMART SET (Cement) ×8 IMPLANT
CUFF TOURN SGL QUICK 34 (TOURNIQUET CUFF) ×2
CUFF TRNQT CYL 34X4X40X1 (TOURNIQUET CUFF) ×1 IMPLANT
DRAPE EXTREMITY T 121X128X90 (DRAPE) ×2 IMPLANT
DRAPE POUCH INSTRU U-SHP 10X18 (DRAPES) ×2 IMPLANT
DRAPE U-SHAPE 47X51 STRL (DRAPES) ×2 IMPLANT
DRSG ADAPTIC 3X8 NADH LF (GAUZE/BANDAGES/DRESSINGS) ×2 IMPLANT
DRSG PAD ABDOMINAL 8X10 ST (GAUZE/BANDAGES/DRESSINGS) ×4 IMPLANT
DRSG TEGADERM 4X4.75 (GAUZE/BANDAGES/DRESSINGS) ×1 IMPLANT
DURAPREP 26ML APPLICATOR (WOUND CARE) ×2 IMPLANT
ELECT REM PT RETURN 9FT ADLT (ELECTROSURGICAL) ×2
ELECTRODE REM PT RTRN 9FT ADLT (ELECTROSURGICAL) ×1 IMPLANT
EVACUATOR 1/8 PVC DRAIN (DRAIN) ×2 IMPLANT
FACESHIELD LNG OPTICON STERILE (SAFETY) ×10 IMPLANT
GAUZE SPONGE 2X2 8PLY STRL LF (GAUZE/BANDAGES/DRESSINGS) IMPLANT
GAUZE XEROFORM 5X9 LF (GAUZE/BANDAGES/DRESSINGS) ×1 IMPLANT
GLOVE BIOGEL PI IND STRL 7.5 (GLOVE) ×1 IMPLANT
GLOVE BIOGEL PI IND STRL 8 (GLOVE) ×1 IMPLANT
GLOVE BIOGEL PI INDICATOR 7.5 (GLOVE) ×1
GLOVE BIOGEL PI INDICATOR 8 (GLOVE) ×1
GLOVE ECLIPSE 8.0 STRL XLNG CF (GLOVE) ×2 IMPLANT
GLOVE ORTHO TXT STRL SZ7.5 (GLOVE) ×4 IMPLANT
GOWN BRE IMP PREV XXLGXLNG (GOWN DISPOSABLE) ×4 IMPLANT
GOWN PREVENTION PLUS LG XLONG (DISPOSABLE) ×2 IMPLANT
GOWN PREVENTION PLUS XXLARGE (GOWN DISPOSABLE) ×2 IMPLANT
HANDPIECE INTERPULSE COAX TIP (DISPOSABLE) ×2
IMMOBILIZER KNEE 20 (SOFTGOODS)
IMMOBILIZER KNEE 20 THIGH 36 (SOFTGOODS) IMPLANT
KIT BASIN OR (CUSTOM PROCEDURE TRAY) ×2 IMPLANT
MANIFOLD NEPTUNE II (INSTRUMENTS) ×2 IMPLANT
MOLD CEMENT FEMORAL 70MM ×1 IMPLANT
MOLD CEMENT TIBIAL 80MM (Orthopedic Implant) ×1 IMPLANT
NS IRRIG 1000ML POUR BTL (IV SOLUTION) ×2 IMPLANT
PACK TOTAL JOINT (CUSTOM PROCEDURE TRAY) ×2 IMPLANT
PADDING CAST COTTON 6X4 STRL (CAST SUPPLIES) ×4 IMPLANT
POSITIONER SURGICAL ARM (MISCELLANEOUS) ×2 IMPLANT
SET HNDPC FAN SPRY TIP SCT (DISPOSABLE) ×1 IMPLANT
SET PAD KNEE POSITIONER (MISCELLANEOUS) ×2 IMPLANT
SPONGE GAUZE 2X2 STER 10/PKG (GAUZE/BANDAGES/DRESSINGS) ×1
SPONGE GAUZE 4X4 12PLY (GAUZE/BANDAGES/DRESSINGS) ×2 IMPLANT
STAPLER VISISTAT 35W (STAPLE) IMPLANT
SUCTION FRAZIER 12FR DISP (SUCTIONS) ×2 IMPLANT
SUT PDS AB 1 CT1 27 (SUTURE) ×4 IMPLANT
SUT VIC AB 1 CT1 36 (SUTURE) ×6 IMPLANT
SUT VIC AB 2-0 CT1 27 (SUTURE) ×6
SUT VIC AB 2-0 CT1 TAPERPNT 27 (SUTURE) ×3 IMPLANT
TOWEL OR 17X26 10 PK STRL BLUE (TOWEL DISPOSABLE) ×4 IMPLANT
TRAY FOLEY CATH 14FRSI W/METER (CATHETERS) ×2 IMPLANT
WATER STERILE IRR 1500ML POUR (IV SOLUTION) ×2 IMPLANT
WRAP KNEE MAXI GEL POST OP (GAUZE/BANDAGES/DRESSINGS) ×2 IMPLANT

## 2013-06-15 NOTE — Anesthesia Postprocedure Evaluation (Signed)
Anesthesia Post Note  Patient: Hunter Santos  Procedure(s) Performed: Procedure(s) (LRB): LEFT RESECTION TOTAL KNEE ARTHROPLASTY WITH PLACEMENT OF ANTIBIOTIC SPACERS (Left)  Anesthesia type: Spinal  Patient location: PACU  Post pain: Pain level controlled  Post assessment: Post-op Vital signs reviewed  Last Vitals: BP 112/74  Pulse 88  Temp(Src) 36.3 C (Oral)  Resp 16  SpO2 98%  Post vital signs: Reviewed  Level of consciousness: sedated  Complications: No apparent anesthesia complications

## 2013-06-15 NOTE — Progress Notes (Signed)
06/15/13 2230 Nursing Patient's cbg 274 at this time. Offered to call md for insulin coverage, patient refused.

## 2013-06-15 NOTE — Brief Op Note (Signed)
06/15/2013  11:52 AM  PATIENT:  Hunter Santos  62 y.o. male  PRE-OPERATIVE DIAGNOSIS:  INFECTED LEFT TOTAL KNEE Replacement  POST-OPERATIVE DIAGNOSIS:  INFECTED LEFT TOTAL KNEE Replacement  PROCEDURE:  Procedure(s): RESECTION Left TOTAL KNEE ARTHROPLASTY WITH PLACEMENT OF ANTIBIOTIC SPACERS (Left)  SURGEON:  Surgeon(s) and Role:    * Shelda Pal, MD - Primary  PHYSICIAN ASSISTANT: Lanney Gins, PA-C  ANESTHESIA:   spinal  EBL:  Total I/O In: 2000 [I.V.:2000] Out: 1100 [Urine:400; Blood:700]  BLOOD ADMINISTERED:none  DRAINS: (1 medium) Hemovact drain(s) in the left knee with  Suction Open   LOCAL MEDICATIONS USED:  NONE  SPECIMEN:  Source of Specimen:  left knee  DISPOSITION OF SPECIMEN:  PATHOLOGY  COUNTS:  YES  TOURNIQUET:   Total Tourniquet Time Documented: Thigh (Left) - 65 minutes Total: Thigh (Left) - 65 minutes   DICTATION: .Other Dictation: Dictation Number [id number]  PLAN OF CARE: Admit to inpatient   PATIENT DISPOSITION:  PACU - hemodynamically stable.   Delay start of Pharmacological VTE agent (>24hrs) due to surgical blood loss or risk of bleeding: no

## 2013-06-15 NOTE — Anesthesia Preprocedure Evaluation (Addendum)
Anesthesia Evaluation  Patient identified by MRN, date of birth, ID band Patient awake    Reviewed: Allergy & Precautions, H&P , NPO status , Patient's Chart, lab work & pertinent test results  Airway Mallampati: II TM Distance: >3 FB Neck ROM: Full    Dental  (+) Dental Advisory Given and Teeth Intact   Pulmonary sleep apnea ,  breath sounds clear to auscultation  Pulmonary exam normal       Cardiovascular hypertension, Pt. on medications Rhythm:Regular Rate:Normal  RBBB on EKG   Neuro/Psych negative neurological ROS  negative psych ROS   GI/Hepatic negative GI ROS, Neg liver ROS,   Endo/Other  diabetes, Type 2, Oral Hypoglycemic Agents  Renal/GU negative Renal ROS     Musculoskeletal negative musculoskeletal ROS (+)   Abdominal   Peds  Hematology  (+) anemia ,   Anesthesia Other Findings   Reproductive/Obstetrics                          Anesthesia Physical  Anesthesia Plan  ASA: II  Anesthesia Plan: Spinal   Post-op Pain Management:    Induction:   Airway Management Planned: Simple Face Mask  Additional Equipment:   Intra-op Plan:   Post-operative Plan:   Informed Consent: I have reviewed the patients History and Physical, chart, labs and discussed the procedure including the risks, benefits and alternatives for the proposed anesthesia with the patient or authorized representative who has indicated his/her understanding and acceptance.   Dental advisory given  Plan Discussed with: CRNA  Anesthesia Plan Comments:         Anesthesia Quick Evaluation

## 2013-06-15 NOTE — Progress Notes (Signed)
Pt refused CPAP. Pt states he does not wear CPAP and now that his deviated septum has been corrected he does not need it. Pt was made aware if he should change his mind to contact RT. RT will continue to monitor as needed.

## 2013-06-15 NOTE — Progress Notes (Signed)
Utilization review completed.  

## 2013-06-15 NOTE — Anesthesia Procedure Notes (Signed)
Spinal  Patient location during procedure: OR End time: 06/15/2013 10:04 AM Staffing CRNA/Resident: Enriqueta Shutter Performed by: anesthesiologist and resident/CRNA  Preanesthetic Checklist Completed: patient identified, site marked, surgical consent, pre-op evaluation, timeout performed, IV checked, risks and benefits discussed and monitors and equipment checked Spinal Block Patient position: sitting Prep: ChloraPrep Patient monitoring: heart rate, continuous pulse ox and blood pressure Approach: midline Location: L3-4 Injection technique: single-shot Needle Needle type: Spinocan  Needle gauge: 22 G Needle length: 9 cm Assessment Sensory level: T6 Additional Notes Expiration date of kit checked and confirmed. Patient tolerated procedure well, without complications.

## 2013-06-15 NOTE — Plan of Care (Signed)
Problem: Consults Goal: Diagnosis- Total Joint Replacement Revision Total Knee     

## 2013-06-15 NOTE — Anesthesia Postprocedure Evaluation (Deleted)
Immediate Anesthesia Transfer of Care Note  Patient: Hunter Santos  Procedure(s) Performed: Procedure(s) (LRB): LEFT RESECTION TOTAL KNEE ARTHROPLASTY WITH PLACEMENT OF ANTIBIOTIC SPACERS (Left)  Patient Location: PACU  Anesthesia Type: Spinal  Level of Consciousness: awake, patient cooperative and responds to stimulation  Airway & Oxygen Therapy: Patient Spontanous Breathing and Patient connected to face mask oxgen  Post-op Assessment: Report given to PACU RN and Post -op Vital signs reviewed and stable  Post vital signs: Reviewed and stable  Complications: No apparent anesthesia complications

## 2013-06-15 NOTE — Transfer of Care (Signed)
Immediate Anesthesia Transfer of Care Note  Patient: Hunter Santos  Procedure(s) Performed: Procedure(s) (LRB): LEFT RESECTION TOTAL KNEE ARTHROPLASTY WITH PLACEMENT OF ANTIBIOTIC SPACERS (Left)  Patient Location: PACU  Anesthesia Type: Spinal  Level of Consciousness: awake, patient cooperative and responds to stimulation  Airway & Oxygen Therapy: Patient Spontanous Breathing and Patient connected to face mask oxgen  Post-op Assessment: Report given to PACU RN and Post -op Vital signs reviewed and stable  Post vital signs: Reviewed and stable  Complications: No apparent anesthesia complications  

## 2013-06-15 NOTE — Interval H&P Note (Signed)
History and Physical Interval Note:  06/15/2013 9:28 AM  Hunter Santos  has presented today for surgery, with the diagnosis of INFECTED LEFT TOTAL KNEE  The various methods of treatment have been discussed with the patient and family. After consideration of risks, benefits and other options for treatment, the patient has consented to  Procedure(s): LEFT RESECTION TOTAL KNEE ARTHROPLASTY (Left) as a surgical intervention .  The patient's history has been reviewed, patient examined, no change in status, stable for surgery.  I have reviewed the patient's chart and labs.  Questions were answered to the patient's satisfaction.     Shelda Pal

## 2013-06-16 ENCOUNTER — Encounter (HOSPITAL_COMMUNITY): Payer: Self-pay | Admitting: Orthopedic Surgery

## 2013-06-16 DIAGNOSIS — E119 Type 2 diabetes mellitus without complications: Secondary | ICD-10-CM | POA: Diagnosis present

## 2013-06-16 DIAGNOSIS — T8454XA Infection and inflammatory reaction due to internal left knee prosthesis, initial encounter: Secondary | ICD-10-CM

## 2013-06-16 DIAGNOSIS — G4733 Obstructive sleep apnea (adult) (pediatric): Secondary | ICD-10-CM | POA: Diagnosis present

## 2013-06-16 DIAGNOSIS — I10 Essential (primary) hypertension: Secondary | ICD-10-CM | POA: Diagnosis present

## 2013-06-16 DIAGNOSIS — Z96659 Presence of unspecified artificial knee joint: Secondary | ICD-10-CM

## 2013-06-16 DIAGNOSIS — T8450XA Infection and inflammatory reaction due to unspecified internal joint prosthesis, initial encounter: Principal | ICD-10-CM

## 2013-06-16 LAB — BASIC METABOLIC PANEL
BUN: 15 mg/dL (ref 6–23)
CO2: 25 mEq/L (ref 19–32)
Calcium: 8.7 mg/dL (ref 8.4–10.5)
Chloride: 97 mEq/L (ref 96–112)
GFR calc Af Amer: >90 mL/min (ref >90)
Glucose, Bld: 237 mg/dL — ABNORMAL HIGH (ref 70–99)
Sodium: 129 mEq/L — ABNORMAL LOW (ref 135–145)

## 2013-06-16 LAB — GLUCOSE, CAPILLARY
Glucose-Capillary: 198 mg/dL — ABNORMAL HIGH (ref 70–99)
Glucose-Capillary: 170 mg/dL — ABNORMAL HIGH (ref 70–99)
Glucose-Capillary: 148 mg/dL — ABNORMAL HIGH (ref 70–99)

## 2013-06-16 LAB — CBC
Hemoglobin: 7.9 g/dL — ABNORMAL LOW (ref 13.0–17.0)
MCH: 23.9 pg — ABNORMAL LOW (ref 26.0–34.0)
MCV: 76.4 fL — ABNORMAL LOW (ref 78.0–100.0)
Platelets: 316 10*3/uL (ref 150–400)
RBC: 3.31 MIL/uL — ABNORMAL LOW (ref 4.22–5.81)
WBC: 8.8 10*3/uL (ref 4.0–10.5)

## 2013-06-16 MED ORDER — SODIUM CHLORIDE 0.9 % IJ SOLN
10.0000 mL | INTRAMUSCULAR | Status: DC | PRN
  Administered 2013-06-18: 10 mL

## 2013-06-16 MED ORDER — VANCOMYCIN HCL 10 G IV SOLR
2500.0000 mg | Freq: Once | INTRAVENOUS | Status: AC
  Administered 2013-06-16: 18:00:00 2500 mg via INTRAVENOUS
  Filled 2013-06-16: qty 2500

## 2013-06-16 MED ORDER — VANCOMYCIN HCL IN DEXTROSE 1-5 GM/200ML-% IV SOLN
1000.0000 mg | Freq: Two times a day (BID) | INTRAVENOUS | Status: DC
  Administered 2013-06-17 – 2013-06-18 (×3): 1000 mg via INTRAVENOUS
  Filled 2013-06-16 (×4): qty 200

## 2013-06-16 MED ORDER — DEXTROSE 5 % IV SOLN
1.0000 g | INTRAVENOUS | Status: DC
  Administered 2013-06-16: 18:00:00 1 g via INTRAVENOUS
  Filled 2013-06-16: qty 10

## 2013-06-16 NOTE — Evaluation (Signed)
Occupational Therapy Evaluation Patient Details Name: Hunter Santos MRN: [id number] DOB: January 10, 1951 Today's Date: 06/16/2013 Time: 0930-1000 OT Time Calculation (min): 30 min  OT Assessment / Plan / Recommendation History of present illness L total knee resection with ABX spacer   Clinical Impression   Pt doing well with functional transfers. Reinforced AE options and coverage.     OT Assessment  Patient needs continued OT Services    Follow Up Recommendations  No OT follow up;Supervision/Assistance - 24 hour    Barriers to Discharge      Equipment Recommendations  None recommended by OT    Recommendations for Other Services    Frequency  Min 2X/week    Precautions / Restrictions Restrictions Weight Bearing Restrictions: Yes LLE Weight Bearing: Partial weight bearing LLE Partial Weight Bearing Percentage or Pounds: 50%   Pertinent Vitals/Pain 4-5/10 reposition, ice L knee    ADL  Eating/Feeding: Independent Where Assessed - Eating/Feeding: Chair Grooming: Wash/dry hands;Set up Where Assessed - Grooming: Supported sitting Upper Body Bathing: Chest;Right arm;Left arm;Abdomen;Set up Where Assessed - Upper Body Bathing: Unsupported sitting Lower Body Bathing: Minimal assistance Where Assessed - Lower Body Bathing: Supported sit to stand Upper Body Dressing: Set up Where Assessed - Upper Body Dressing: Unsupported sitting Lower Body Dressing: Moderate assistance Where Assessed - Lower Body Dressing: Supported sit to stand Toilet Transfer: Lobbyist: Raised toilet seat with arms (or 3-in-1 over toilet) Toileting - Clothing Manipulation and Hygiene: Min guard Where Assessed - Toileting Clothing Manipulation and Hygiene: Sit to stand from 3-in-1 or toilet Equipment Used: Long-handled shoe horn;Long-handled sponge;Reacher;Rolling walker;Sock aid ADL Comments: Demonstrated all AE. Pt has a Sports administrator and states wife can help with LB self care also.  Explained coverage of AE if pt interested in obtaining other AE and demonstrated use of all. Pt did well with 3in1 transfer. Pt to be getting PICC line so likely will be sponge bathing. No KI ordered. Nursing to discuss with PA.     OT Diagnosis: Generalized weakness  OT Problem List: Decreased knowledge of use of DME or AE;Decreased strength OT Treatment Interventions: Self-care/ADL training;DME and/or AE instruction;Therapeutic activities;Patient/family education   OT Goals(Current goals can be found in the care plan section) Acute Rehab OT Goals Patient Stated Goal: return to gardening OT Goal Formulation: With patient Time For Goal Achievement: 06/23/13 Potential to Achieve Goals: Good  Visit Information  Last OT Received On: 06/16/13 Assistance Needed: +1 History of Present Illness: L total knee resection with ABX spacer       Prior Functioning     Home Living Family/patient expects to be discharged to:: Private residence Living Arrangements: Spouse/significant other Available Help at Discharge: Family;Available 24 hours/day Type of Home: House Home Access: Stairs to enter [employer] of Steps: 3 Entrance Stairs-Rails: Right Home Layout: Two level;Able to live on main level with bedroom/bathroom Home Equipment: Dan Humphreys - 2 wheels;Tub bench;Shower seat;Bedside commode;Crutches;Cane - single point;Adaptive equipment Adaptive Equipment: Reacher Additional Comments: has reacher Prior Function Level of Independence: Independent with assistive device(s) Comments: used cane PTA  Communication Communication: No difficulties Dominant Hand: Right         Vision/Perception     Cognition  Cognition Arousal/Alertness: Awake/alert Behavior During Therapy: WFL for tasks assessed/performed Overall Cognitive Status: Within Functional Limits for tasks assessed    Extremity/Trunk Assessment Upper Extremity Assessment Upper Extremity Assessment: Overall WFL for  tasks assessed Lower Extremity Assessment  Cervical / Trunk Assessment Cervical / Trunk Assessment: Normal  Mobility Bed Mobility Bed Mobility: Not assessed Transfers Sit to Stand: 5: Supervision;With upper extremity assist;From bed;From chair/3-in-1 Stand to Sit: 5: Supervision;With upper extremity assist;To chair/3-in-1 Details for Transfer Assistance: verbal cues for hand placement     Exercise     Balance Balance Balance Assessed: Yes Dynamic Standing Balance Dynamic Standing - Level of Assistance: 5: Stand by assistance   End of Session OT - End of Session Equipment Utilized During Treatment: Rolling walker Activity Tolerance: Patient tolerated treatment well Patient left: in chair;with call bell/phone within reach  GO     Lennox Laity [phone] 06/16/2013, 10:59 AM

## 2013-06-16 NOTE — Evaluation (Signed)
Physical Therapy Evaluation Patient Details Name: Hunter Santos MRN: [id number] DOB: 1951-01-23 Today's Date: 06/16/2013 Time: 7829-5621 PT Time Calculation (min): 21 min  PT Assessment / Plan / Recommendation History of Present Illness  L total knee resection with ABX spacer  Clinical Impression  *62 y.o. Male admitted for resection of L TKA with ABX spacer. Pt currently with functional limitations due to the deficits listed below (see PT Problem List).  Pt will benefit from skilled PT to increase their independence and safety with mobility to allow discharge to the venue listed below.   Noted pt doesn't have knee immobilizer ordered. RN to clarify with ortho PA on this.   **    PT Assessment  Patient needs continued PT services    Follow Up Recommendations  Home health PT    Does the patient have the potential to tolerate intense rehabilitation      Barriers to Discharge        Equipment Recommendations  None recommended by PT    Recommendations for Other Services OT consult   Frequency 7X/week    Precautions / Restrictions Restrictions Weight Bearing Restrictions: Yes LLE Weight Bearing: Partial weight bearing LLE Partial Weight Bearing Percentage or Pounds: 50%   Pertinent Vitals/Pain *4/10 L knee Premedicated, ice applied**      Mobility  Bed Mobility Bed Mobility: Not assessed Transfers Transfers: Sit to Stand;Stand to Sit Sit to Stand: 5: Supervision;From bed Stand to Sit: 5: Supervision;To chair/3-in-1 Ambulation/Gait Ambulation/Gait Assistance: 5: Supervision Ambulation Distance (Feet): 120 Feet Assistive device: Rolling walker Gait Pattern: Step-through pattern;Antalgic General Gait Details: steady, no LOB, good adherence to PWB status LLE    Exercises     PT Diagnosis: Difficulty walking;Acute pain  PT Problem List: Decreased strength;Decreased activity tolerance;Pain;Obesity;Decreased mobility PT Treatment Interventions:       PT  Goals(Current goals can be found in the care plan section) Acute Rehab PT Goals Patient Stated Goal: return to gardening PT Goal Formulation: With patient Time For Goal Achievement: 06/30/13 Potential to Achieve Goals: Good  Visit Information  Last PT Received On: 06/16/13 Assistance Needed: +1 History of Present Illness: L total knee resection with ABX spacer       Prior Functioning  Home Living Family/patient expects to be discharged to:: Private residence Living Arrangements: Spouse/significant other Available Help at Discharge: Family;Available 24 hours/day Type of Home: House Home Access: Stairs to enter [employer] of Steps: 3 Entrance Stairs-Rails: Right Home Layout: Two level;Able to live on main level with bedroom/bathroom Home Equipment: Dan Humphreys - 2 wheels;Tub bench;Shower seat;Bedside commode;Crutches;Cane - single point Additional Comments: has reacher Prior Function Level of Independence: Independent with assistive device(s) Comments: used cane PTA  Communication Communication: No difficulties Dominant Hand: Right    Cognition  Cognition Arousal/Alertness: Awake/alert Behavior During Therapy: WFL for tasks assessed/performed Overall Cognitive Status: Within Functional Limits for tasks assessed    Extremity/Trunk Assessment Upper Extremity Assessment Upper Extremity Assessment: Overall WFL for tasks assessed Lower Extremity Assessment Lower Extremity Assessment: LLE deficits/detail LLE Deficits / Details: sensation intact, t, ankle WNL Cervical / Trunk Assessment Cervical / Trunk Assessment: Normal   Balance    End of Session PT - End of Session Equipment Utilized During Treatment: Gait belt Activity Tolerance: Patient tolerated treatment well Patient left: in chair;with call bell/phone within reach Nurse Communication: Mobility status  GP     Hunter Santos 06/16/2013, 10:43 AM [phone]

## 2013-06-16 NOTE — Progress Notes (Signed)
[facility]  Patient Status:   New pt this admission for [facility]  [facility] is providing the following services: Pt will have RN, PT if ordered and home infusion pharmacy for IV antibiotics at home.  [facility] hospital Infusion Coordinator will provide in hospital teaching prior to DC to support transition to home when deemed appropriate by MD.   If patient discharges after hours, please call [phone].   Hunter Santos 06/16/2013, 11:19 AM

## 2013-06-16 NOTE — Progress Notes (Signed)
Peripherally Inserted Central Catheter/Midline Placement  The IV Nurse has discussed with the patient and/or persons authorized to consent for the patient, the purpose of this procedure and the potential benefits and risks involved with this procedure.  The benefits include less needle sticks, lab draws from the catheter and patient may be discharged home with the catheter.  Risks include, but not limited to, infection, bleeding, blood clot (thrombus formation), and puncture of an artery; nerve damage and irregular heat beat.  Alternatives to this procedure were also discussed.  PICC/Midline Placement Documentation  PICC / Midline Single Lumen 06/16/13 PICC Right Basilic 45 cm 0 cm (Active)       Christeen Douglas 06/16/2013, 8:26 PM

## 2013-06-16 NOTE — Progress Notes (Signed)
Physical Therapy Treatment Patient Details Name: Hunter Santos MRN: [id number] DOB: September 28, 1950 Today's Date: 06/16/2013 Time: 9147-8295 PT Time Calculation (min): 19 min  PT Assessment / Plan / Recommendation  History of Present Illness L total knee resection with ABX spacer   PT Comments   **pt progressing well with mobility, per Danny Lawless RN, [employer] PA gave verbal order for knee immobilizer and no ROM to L knee. Phoned ortho tech to request knee immobilizer. *  Follow Up Recommendations  Home health PT     Does the patient have the potential to tolerate intense rehabilitation     Barriers to Discharge        Equipment Recommendations  None recommended by PT    Recommendations for Other Services OT consult  Frequency 7X/week   Progress towards PT Goals Progress towards PT goals: Progressing toward goals  Plan Current plan remains appropriate    Precautions / Restrictions Precautions Precaution Comments: no ROM L knee, per verbal order from Freddie Breech, PA Required Braces or Orthoses: Knee Immobilizer - Left Restrictions Weight Bearing Restrictions: Yes LLE Weight Bearing: Partial weight bearing LLE Partial Weight Bearing Percentage or Pounds: 50%   Pertinent Vitals/Pain *4/10 L knee Premedicated Ice applied**    Mobility  Bed Mobility Bed Mobility: Supine to Sit;Sit to Supine Supine to Sit: 4: Min assist Sit to Supine: 4: Min assist Details for Bed Mobility Assistance: min A to support LLE Transfers Transfers: Sit to Stand;Stand to Sit Sit to Stand: 5: Supervision;From bed Stand to Sit: 5: Supervision;To bed Details for Transfer Assistance: verbal cues for hand placement Ambulation/Gait Ambulation/Gait Assistance: 5: Supervision Ambulation Distance (Feet): 230 Feet Assistive device: Rolling walker Gait Pattern: Step-through pattern;Antalgic General Gait Details: steady, no LOB, good adherence to PWB status LLE    Exercises Total Joint Exercises Ankle  Circles/Pumps: AROM;Both;10 reps   PT Diagnosis: Difficulty walking;Acute pain  PT Problem List: Decreased strength;Decreased activity tolerance;Pain;Obesity;Decreased mobility PT Treatment Interventions:     PT Goals (current goals can now be found in the care plan section) Acute Rehab PT Goals Patient Stated Goal: return to gardening PT Goal Formulation: With patient Time For Goal Achievement: 06/30/13 Potential to Achieve Goals: Good  Visit Information  Last PT Received On: 06/16/13 Assistance Needed: +1 History of Present Illness: L total knee resection with ABX spacer    Subjective Data  Patient Stated Goal: return to gardening   Cognition  Cognition Arousal/Alertness: Awake/alert Behavior During Therapy: WFL for tasks assessed/performed Overall Cognitive Status: Within Functional Limits for tasks assessed    Balance  Balance Balance Assessed: Yes Dynamic Standing Balance Dynamic Standing - Level of Assistance: 5: Stand by assistance  End of Session PT - End of Session Equipment Utilized During Treatment: Gait belt;Left knee immobilizer Activity Tolerance: Patient tolerated treatment well Patient left: with call bell/phone within reach;in bed;with family/visitor present Nurse Communication: Mobility status   GP     Ralene Bathe Kistler 06/16/2013, 1:38 PM [phone]

## 2013-06-16 NOTE — Op Note (Signed)
NAMEMarland Kitchen  KYSEAN, SWEET NO.:  [id number]  MEDICAL RECORD NO.:  [id number]  LOCATION:  1603                         FACILITY:  [facility]  PHYSICIAN:  Madlyn Frankel. Charlann Boxer, M.D.  DATE OF BIRTH:  [birth year]  DATE OF PROCEDURE:  06/15/2013 DATE OF DISCHARGE:                              OPERATIVE REPORT   PREOPERATIVE DIAGNOSIS:  Infected left total knee arthroplasty.  POSTOPERATIVE DIAGNOSIS:  Infected left total knee arthroplasty.  PROCEDURE: 1. Resection of infected left total knee arthroplasty. 2. Placement of antibiotic spacer utilizing Biomet molds with 4     batches of cement mixed with 4 g of vancomycin and 4.8 g of     tobramycin.  SURGEON:  Madlyn Frankel. Charlann Boxer, M.D.  ASSISTANT:  Lanney Gins, PA-C.  Note that Mr. Carmon Sails was present for the entirety of the case from preoperative position, perioperative management of upper extremity, general facilitation of the case, and primary wound closure.  ANESTHESIA:  Spinal.  SPECIMENS:  I did send joint fluid swab to pathology.  We had already aspirated the knee and had received indication of infection previously. These values can be obtained from either [facility] or [facility] not certain which 1.  DRAINS:  One medium Hemovac.  BLOOD LOSS:  Probably about 700 mL.  Recognizes the result of the significant synovectomy from debridement once tourniquet was let down.  INDICATIONS FOR PROCEDURE:  Mr. Fukuda is a very pleasant, 62 year old patient of mine, who had done very well with his knee replacement until recently he presented to the office with increasing pain and swelling with no known procedures.  Aspiration at that time revealed concern for infection.  He was scheduled for resection of his knee, however, had some social issues to attend due to mainly with his wife and his mother. He presents today for resection.  Risks, benefits, and necessity of this were all discussed and reviewed as well as the postoperative course  and expectations and proceed forward to intervention reimplantation.  PROCEDURE IN DETAIL:  The patient was brought to the operative theater. Once adequate anesthesia preoperative antibiotics, which did include 2 g of Ancef in addition to a gram of vancomycin, patient was positioned supine with his left thigh tourniquet placed.  The left lower extremity was then prepped and draped in sterile fashion.  Time-out was performed identifying the patient, planned procedure, and extremity.  The leg was exsanguinated.  Tourniquet elevated to 250 mmHg.  The patient's old incision was excised.  Soft tissue planes created.  Median arthrotomy was made.  Once I entered the joint, joint fluid was swabbed for Gram stain culture with anaerobic and aerobic evaluation.  At this point, the knee was further exposed.  Significant synovectomy was carried out medially initially and eventually a significant suprapatellar synovectomy where we found significant amount of this purulent type fluid, as well as in the lateral gutter.  He was noted to have significant synovitic response to this infectious process.  With the knee exposed, I identified the tibial component was loose already.  The femoral component was loosened using a 10 blade ACL saw to undermine the bone cement interface.  The femoral component was removed easily without any significant bone loss.  The tibia was then subluxated anteriorly and the tibia was removed.  Spends significant amount of time at this point removing excessive cement as well as performing further synovectomy throughout the knee, again medially suprapatellar behind the knee in the posterior aspect after removal of components as well as in the lateral aspect of the knee.  The patellar button was removed as were the plastic pieces from the cement.  Following this, the significant debridement of the canals were opened and a canal brush irrigator was used with 1 L normal saline  solution to irrigate out the tibia and femur.  I then irrigated the knee out at this point with about 4 L of pulse lavage normal saline solution.  Well, this debridement and irrigation was carried out.  Babish was on the back table preparing the cement molds measured to match the femoral component and tibial components were removed.  Two batches of cement were used to make the femoral component and a single batch for the tibial component measuring 15 mm approximate.  Following the irrigation and debridement final preparation, following the preparation of the cement molds, the final components were then cemented onto the distal femur, proximal tibia, and held in extension until the cement cured.  A single batch of cement was utilized to hold this in place.  At this point, following this debridement, the tourniquet was still up and went ahead and placed a medium Hemovac drain deep and recognized the significant synovectomy was carried out and the fact he is going to be oozing and being possible for total hemostasis, so we started closing the wound.  The tourniquet was let down after 65 minutes.  It was noted to be significant bleeding which appeared to be more from a synovectomy than anything else.  The extensor mechanism was reapproximated using #1 PDS suture.  The remainder of the wound was closed with 2-0 Vicryl and the skin with staples.  The skin was then cleaned, dried, and dressed sterilely using Xeroform and a bulky sterile wrap.  He was brought to the recovery room in stable condition.  He will have a PICC line placed tomorrow and will follow along and consult Infectious Disease for antibiotic management.  I will follow his vital signs and hemoglobin status, he most likely will require transfusion based on his initial blush of synovial bleeding.    Madlyn Frankel Charlann Boxer, M.D.    MDO/MEDQ  D:  06/15/2013  T:  06/16/2013  Job:  [id number]

## 2013-06-16 NOTE — Care Management Note (Addendum)
    Page 1 of 2   06/18/2013     7:17:44 PM   CARE MANAGEMENT NOTE 06/18/2013  Patient:  Hunter Santos, Hunter Santos   Account Number:  [id number]  Date Initiated:  06/16/2013  Documentation initiated by:  Colleen Can  Subjective/Objective Assessment:   dx infected left total knee; resection of infected left total knee arthroplasty, placemnt of abx spacer     Action/Plan:   Cm spoke with patient and spouse. Plans are for patient to return to his home in [facility] where spouse will be caregiver. He will require poss home IV abx. Pt is requesting [facility]. Already has DME   Anticipated DC Date:  06/18/2013   Anticipated DC Plan:  HOME W HOME HEALTH SERVICES      DC Planning Services  CM consult      [facility] Choice  HOME HEALTH   Choice offered to / List presented to:  C-1 Patient        HH arranged  HH-2 PT  HH-1 RN  IV Antibiotics      HH agency  [facility]   Status of service:  Completed, signed off Medicare Important Message given?   (If response is "NO", the following Medicare IM given date fields will be blank) Date Medicare IM given:   Date Additional Medicare IM given:    Discharge Disposition:  HOME W HOME HEALTH SERVICES  Per UR Regulation:    If discussed at Long Length of Stay Meetings, dates discussed:    Comments:  06/18/2013 Colleen Can BSN RN CCM [phone] Pt discharged today with [facility] IN PLACE. START OF SERVICES TODAY.  06/16/2013 Colleen Can BSN RN CCM [phone] [facility] rep called to request HHrn and pt services. CM advised that they could provide [facility] services. Wii await HH orders. CM will follow.

## 2013-06-16 NOTE — Progress Notes (Signed)
Pt refused CPAP. Pt states he does not wear CPAP at home and now that his deviated septum has been corrected he does not need it. Pt was made aware if he should change his mind to contact RT. RT will continue to monitor as needed.

## 2013-06-16 NOTE — Progress Notes (Signed)
Inpatient Diabetes Program Recommendations  AACE/ADA: New Consensus Statement on Inpatient Glycemic Control (2013)  Target Ranges:  Prepandial:   less than 140 mg/dL      Peak postprandial:   less than 180 mg/dL (1-2 hours)      Critically ill patients:  140 - 180 mg/dL   Reason for Visit: Hyperglycemia  Results for Hunter Santos, Hunter Santos (MRN [id number]) as of 06/16/2013 13:33  Ref. Range 06/15/2013 13:35 06/15/2013 16:51 06/15/2013 22:20 06/16/2013 07:18 06/16/2013 11:35  Glucose-Capillary Latest Range: 70-99 mg/dL 409 (H) 811 (H) 914 (H) 198 (H) 170 (H)    Inpatient Diabetes Program Recommendations Correction (SSI): Increase Novolog to resistant tidwc and hs  Note: Will follow while inpatient. Thank you. Ailene Ards, RD, LDN, CDE Inpatient Diabetes Coordinator [phone]

## 2013-06-16 NOTE — Progress Notes (Signed)
ANTIBIOTIC CONSULT NOTE - INITIAL  Pharmacy Consult for Vancomycin Indication: Infected prosthetic knee  No Known Allergies  Patient Measurements: Height: 6\' 2"  (188 cm) Weight: 262 lb (118.842 kg) IBW/kg (Calculated) : 82.2  Vital Signs: Temp: 97.7 F (36.5 C) (12/02 1317) Temp src: Oral (12/02 1317) BP: 128/82 mmHg (12/02 1317) Pulse Rate: 92 (12/02 1317) Intake/Output from previous day: 12/01 0701 - 12/02 0700 In: 4880 [P.O.:480; I.V.:4350; IV Piggyback:50] Out: 2325 [Urine:1375; Drains:250; Blood:700] Intake/Output from this shift: Total I/O In: 890 [P.O.:480; I.V.:410] Out: 400 [Urine:400]  Labs:  Recent Labs  06/16/13 0510  WBC 8.8  HGB 7.9*  PLT 316  CREATININE 0.64   Estimated Creatinine Clearance: 131.1 ml/min (by C-G formula based on Cr of 0.64). No results found for this basename: VANCOTROUGH, Leodis Binet, VANCORANDOM, GENTTROUGH, GENTPEAK, GENTRANDOM, TOBRATROUGH, TOBRAPEAK, TOBRARND, AMIKACINPEAK, AMIKACINTROU, AMIKACIN,  in the last 72 hours   Microbiology: Recent Results (from the past 720 hour(s))  SURGICAL PCR SCREEN     Status: None   Collection Time    06/03/13  8:15 AM      Result Value Range Status   MRSA, PCR NEGATIVE  NEGATIVE Final   Staphylococcus aureus NEGATIVE  NEGATIVE Final   Comment:            The Xpert SA Assay (FDA     approved for NASAL specimens     in patients over 63 years of age),     is one component of     a comprehensive surveillance     program.  Test performance has     been validated by [employer] for patients greater     than or equal to 41 year old.     It is not intended     to diagnose infection nor to     guide or monitor treatment.  GRAM STAIN     Status: None   Collection Time    06/15/13 10:24 AM      Result Value Range Status   Specimen Description SYNOVIAL LT KNEE   Final   Special Requests NONE   Final   Gram Stain     Final   Value: FEW WBC PRESENT,BOTH PMN AND MONONUCLEAR     NO ORGANISMS  SEEN     Gram Stain Report Called to,Read Back By and Verified With: DR, OLIN AT 1111 ON 12.1.14 BY SHUEA   Report Status 06/15/2013 FINAL   Final  ANAEROBIC CULTURE     Status: None   Collection Time    06/15/13 10:24 AM      Result Value Range Status   Specimen Description SYNOVIAL LT KNEE   Final   Special Requests NONE   Final   Gram Stain     Final   Value: MODERATE WBC PRESENT, PREDOMINANTLY PMN     NO SQUAMOUS EPITHELIAL CELLS SEEN     NO ORGANISMS SEEN     Performed at [facility]   Culture     Final   Value: NO ANAEROBES ISOLATED; CULTURE IN PROGRESS FOR 5 DAYS     Performed at [facility]   Report Status PENDING   Incomplete  BODY FLUID CULTURE     Status: None   Collection Time    06/15/13 10:24 AM      Result Value Range Status   Specimen Description SYNOVIAL LT KNEE   Final   Special Requests NONE   Final   Gram  Stain     Final   Value: FEW WBC PRESENT,BOTH PMN AND MONONUCLEAR     NO ORGANISMS SEEN     Gram Stain Report Called to,Read Back By and Verified With: Gram Stain Report Called to,Read Back By and Verified With: DR. Charlann Boxer AT 1111 06/15/13 BY SHUEA Performed by [facility]     Performed at [facility]   Culture     Final   Value: NO GROWTH 1 DAY     Performed at [facility]   Report Status PENDING   Incomplete    Medical History: Past Medical History  Diagnosis Date  . Hypertension   . Diabetes mellitus   . Sleep apnea     had surgery for deviated septum previously  . Arthritis     Medications:  Scheduled:  . amLODipine  10 mg Oral QAC breakfast  . aspirin EC  325 mg Oral BID  . cefTRIAXone (ROCEPHIN)  IV  1 g Intravenous Q24H  . celecoxib  200 mg Oral Q12H  . docusate sodium  100 mg Oral BID  . ferrous sulfate  325 mg Oral TID PC  . furosemide  40 mg Oral QAC breakfast  . glimepiride  4 mg Oral QAC breakfast  . HYDROcodone-acetaminophen  1-2 tablet Oral Q4H  . insulin aspart  0-15 Units  Subcutaneous TID WC  . metFORMIN  1,000 mg Oral BID WC  . polyethylene glycol  17 g Oral BID  . simvastatin  20 mg Oral q1800   Infusions:  . sodium chloride 0.9 % 1,000 mL with potassium chloride 10 mEq infusion 100 mL/hr at 06/16/13 1149   Assessment: 62 yr obese male s/p resection of left TKA with antibiotic spacer placement on 06/15/13.   ID consulted  Rocephin 1gm IV q24h along with Vancomycin per pharmacy ordered by ID for treatment of infected left prosthetic knee with plans for at least 6 weeks of antibiotics  WBC 8.8, Scr = 0.64 with CrCl(n) = 121, AF  Synovial fluid culture obtained 12/1 = NG x 1 day  Goal of Therapy:  Vancomycin trough level 15-20 mcg/ml  Plan:  Measure antibiotic drug levels at steady state Follow up culture results  Vancomycin 2500mg  IV x 1 loading dose followed by 1gm IV q12h  Malaiya Paczkowski, Joselyn Glassman, PharmD 06/16/2013,4:28 PM

## 2013-06-16 NOTE — Consult Note (Signed)
[facility]    Date of Admission:  06/15/2013           Reason for Consult: Probable, smoldering left prosthetic knee infection    Referring Physician: Dr. Lajoyce Corners  Principal Problem:   Infection of prosthetic left knee joint Active Problems:   S/P left knee resection, abx spacer   Expected blood loss anemia   Obese   Hyponatremia   DM (diabetes mellitus)   HTN (hypertension)   OSA (obstructive sleep apnea)   . amLODipine  10 mg Oral QAC breakfast  . aspirin EC  325 mg Oral BID  . celecoxib  200 mg Oral Q12H  . docusate sodium  100 mg Oral BID  . ferrous sulfate  325 mg Oral TID PC  . furosemide  40 mg Oral QAC breakfast  . glimepiride  4 mg Oral QAC breakfast  . HYDROcodone-acetaminophen  1-2 tablet Oral Q4H  . insulin aspart  0-15 Units Subcutaneous TID WC  . metFORMIN  1,000 mg Oral BID WC  . polyethylene glycol  17 g Oral BID  . simvastatin  20 mg Oral q1800    Recommendations: 1. IV vancomycin and ceftriaxone 2. PICC placement 3. Await results of operative cultures from 12/1 and try to locate cultures obtained earlier this fall   Assessment: I suspect that he has smoldering prosthetic knee infection. I will need to locate the outpatient cultures done several months ago on weight final results of yesterday's operative cultures. I will treat him with empiric vancomycin and ceftriaxone for now. He will need at least 6 weeks of antibiotic therapy.  HPI: Hunter Santos is a 62 y.o. male who underwent successful left prosthetic knee infection in October of 2013. He did well initially and healed without incident. He underwent routine dental work in March and took peri-procedure prophylactic antibiotics. He began to notice some swelling and discomfort in his left knee in June. He thought that maybe he had twisted his knee although he does not recall any specific injury. His wife finally convinced him to be evaluated for this. He  believes he was seen in Dr. Nilsa Nutting office in late August or September. Fluid drained from his left knee showed 55,000 white blood cells. In speaking with Dr. Charlann Boxer that today it sounds like there was a final culture report but I could not obtain those results from [facility] laboratory. He was seen several days later in the office and had more fluid drained. He was encouraged to undergo resection arthroplasty as soon as possible but this had to be delayed because his wife was undergoing surgery to reverse her colostomy and his 51 year old mother was having health problems as well. He was admitted yesterday and underwent resection arthroplasty. He had significant synovitis and the tibial component of the prosthesis was loose. Operative Gram stain shows white blood cells but no organisms and culture is pending.   Review of Systems: Constitutional: negative for anorexia, chills, fevers, sweats and weight loss Eyes: negative Ears, nose, mouth, throat, and face: negative Respiratory: negative Cardiovascular: negative Gastrointestinal: negative Genitourinary:negative Musculoskeletal:positive for findings as noted in the history of present illness  Past Medical History  Diagnosis Date  . Hypertension   . Diabetes mellitus   . Sleep apnea     had surgery for deviated septum previously  . Arthritis     History  Substance Use Topics  . Smoking status: Never Smoker   . Smokeless  tobacco: Never Used  . Alcohol Use: Yes     Comment: rarely    History reviewed. No pertinent family history. No Known Allergies  OBJECTIVE: Blood pressure 128/82, pulse 92, temperature 97.7 F (36.5 C), temperature source Oral, resp. rate 16, height 6\' 2"  (1.88 m), weight 118.842 kg (262 lb), SpO2 100.00%. General: He is in good spirits watching television Skin: No rash Lungs: Clear Cor: Regular S1 and S2 with no murmurs Left leg: Soft cast  Microbiology: Recent Results (from the past 240 hour(s))  GRAM  STAIN     Status: None   Collection Time    06/15/13 10:24 AM      Result Value Range Status   Specimen Description SYNOVIAL LT KNEE   Final   Special Requests NONE   Final   Gram Stain     Final   Value: FEW WBC PRESENT,BOTH PMN AND MONONUCLEAR     NO ORGANISMS SEEN     Gram Stain Report Called to,Read Back By and Verified With: DR, OLIN AT 1111 ON 12.1.14 BY SHUEA   Report Status 06/15/2013 FINAL   Final  ANAEROBIC CULTURE     Status: None   Collection Time    06/15/13 10:24 AM      Result Value Range Status   Specimen Description SYNOVIAL LT KNEE   Final   Special Requests NONE   Final   Gram Stain     Final   Value: MODERATE WBC PRESENT, PREDOMINANTLY PMN     NO SQUAMOUS EPITHELIAL CELLS SEEN     NO ORGANISMS SEEN     Performed at [facility]   Culture     Final   Value: NO ANAEROBES ISOLATED; CULTURE IN PROGRESS FOR 5 DAYS     Performed at [facility]   Report Status PENDING   Incomplete  BODY FLUID CULTURE     Status: None   Collection Time    06/15/13 10:24 AM      Result Value Range Status   Specimen Description SYNOVIAL LT KNEE   Final   Special Requests NONE   Final   Gram Stain     Final   Value: FEW WBC PRESENT,BOTH PMN AND MONONUCLEAR     NO ORGANISMS SEEN     Gram Stain Report Called to,Read Back By and Verified With: Gram Stain Report Called to,Read Back By and Verified With: DR. Charlann Boxer AT 1111 06/15/13 BY SHUEA Performed by [facility]     Performed at [facility]   Culture     Final   Value: NO GROWTH 1 DAY     Performed at [facility]   Report Status PENDING   Incomplete    Hunter Asters, MD [facility] [phone] pager   [phone] cell 06/16/2013, 4:10 PM

## 2013-06-16 NOTE — Progress Notes (Signed)
[facility]  [facility] is providing the following services: patient has all equipment at home.  No DME needs at this time.   If patient discharges after hours, please call [phone].   Hunter Santos 06/16/2013, 11:26 AM

## 2013-06-16 NOTE — Progress Notes (Signed)
   Subjective: 1 Day Post-Op Procedure(s) (LRB): LEFT RESECTION TOTAL KNEE ARTHROPLASTY WITH PLACEMENT OF ANTIBIOTIC SPACERS (Left)   Patient reports pain as mild, pain controlled. No events throughout the night.   Objective:   VITALS:   Filed Vitals:   06/16/13  BP: 127/75  Pulse: 85  Temp: 97.8 F (36.6 C)   Resp: 16    Neurovascular intact Dorsiflexion/Plantar flexion intact Incision: dressing C/D/I No cellulitis present Compartment soft  LABS  Recent Labs  06/16/13 0510  HGB 7.9*  HCT 25.3*  WBC 8.8  PLT 316     Recent Labs  06/16/13 0510  NA 129*  K 4.5  BUN 15  CREATININE 0.64  GLUCOSE 237*     Assessment/Plan: 1 Day Post-Op Procedure(s) (LRB): LEFT RESECTION TOTAL KNEE ARTHROPLASTY WITH PLACEMENT OF ANTIBIOTIC SPACERS (Left) HV drain d/c'ed Foley cath d/c'ed PICC line ordered ID Consult  Advance diet Up with therapy D/C IV fluids Discharge home with home health eventually, when ready  ABLA  Treated with iron and will observe  Obese (BMI 30-39.9) Estimated body mass index is 33.62 kg/(m^2) as calculated from the following:   Height as of this encounter: 6\' 2"  (1.88 m).   Weight as of this encounter: 118.842 kg (262 lb). Patient also counseled that weight may inhibit the healing process Patient counseled that losing weight will help with future health issues  Hyponatremia Treated with IV fluids and will observe      Anastasio Auerbach. Roseland Braun   PAC  06/16/2013, 7:58 AM

## 2013-06-17 DIAGNOSIS — T8140XA Infection following a procedure, unspecified, initial encounter: Secondary | ICD-10-CM

## 2013-06-17 DIAGNOSIS — B9689 Other specified bacterial agents as the cause of diseases classified elsewhere: Secondary | ICD-10-CM

## 2013-06-17 DIAGNOSIS — Z8614 Personal history of Methicillin resistant Staphylococcus aureus infection: Secondary | ICD-10-CM

## 2013-06-17 LAB — BASIC METABOLIC PANEL
BUN: 15 mg/dL (ref 6–23)
CO2: 26 mEq/L (ref 19–32)
Chloride: 97 mEq/L (ref 96–112)
GFR calc Af Amer: >90 mL/min (ref >90)
GFR calc non Af Amer: >90 mL/min (ref >90)
Potassium: 3.5 mEq/L (ref 3.5–5.1)
Sodium: 132 mEq/L — ABNORMAL LOW (ref 135–145)

## 2013-06-17 LAB — CBC
MCV: 77.4 fL — ABNORMAL LOW (ref 78.0–100.0)
Platelets: 272 10*3/uL (ref 150–400)
RBC: 2.88 MIL/uL — ABNORMAL LOW (ref 4.22–5.81)
WBC: 6.9 10*3/uL (ref 4.0–10.5)

## 2013-06-17 LAB — PREPARE RBC (CROSSMATCH)

## 2013-06-17 LAB — GLUCOSE, CAPILLARY
Glucose-Capillary: 158 mg/dL — ABNORMAL HIGH (ref 70–99)
Glucose-Capillary: 109 mg/dL — ABNORMAL HIGH (ref 70–99)
Glucose-Capillary: 103 mg/dL — ABNORMAL HIGH (ref 70–99)

## 2013-06-17 MED ORDER — DEXTROSE 5 % IV SOLN
2.0000 g | INTRAVENOUS | Status: DC
  Filled 2013-06-17: qty 2

## 2013-06-17 NOTE — Progress Notes (Signed)
OT Cancellation Note  Patient Details Name: BRIGHT SPIELMANN MRN: [id number] DOB: 01/20/1951   Cancelled Treatment:    Reason Eval/Treat Not Completed: Other (comment) Pt to receive 2 units of blood. Per PA not d/c today so will practice LB dressing closer to time for d/c.   Judithann Sauger Pence 06/17/2013, 9:54 AM

## 2013-06-17 NOTE — Progress Notes (Signed)
06/17/13 1214  PT Visit Information  Last PT Received On 06/17/13  Assistance Needed +1  History of Present Illness L total knee resection with ABX spacer  PT Time Calculation  PT Start Time 1151  PT Stop Time 1212  PT Time Calculation (min) 21 min  Subjective Data  Subjective pt in chair, blood started earlier  Patient Stated Goal return to gardening  Precautions  Precautions Other (comment)  Precaution Comments no ROM L knee, per verbal order from [facility], [place]; thorough review of precautions   Required Braces or Orthoses Knee Immobilizer - Left  Knee Immobilizer - Left On at all times  Restrictions  LLE Weight Bearing PWB  LLE Partial Weight Bearing Percentage or Pounds 50%  Cognition  Arousal/Alertness Awake/alert  Behavior During Therapy WFL for tasks assessed/performed  Overall Cognitive Status Within Functional Limits for tasks assessed  Bed Mobility  Bed Mobility Not assessed  Transfers  Transfers Sit to Stand;Stand to Sit  Sit to Stand 5: Supervision;From chair/3-in-1;With upper extremity assist  Stand to Sit 5: Supervision;To chair/3-in-1;With upper extremity assist  Details for Transfer Assistance verbal cues for hand placement  Ambulation/Gait  Ambulation/Gait Assistance 5: Supervision  Ambulation Distance (Feet) 170 Feet  Assistive device Rolling walker  Ambulation/Gait Assistance Details cues for upward gaze and step length  Gait Pattern Step-to pattern  Stairs (wants to practice later)  PT - End of Session  Equipment Utilized During Treatment Gait belt;Left knee immobilizer  Activity Tolerance Patient tolerated treatment well  Patient left Other (comment);with nursing/sitter in room;with family/visitor present (in bathroom)  Nurse Communication Mobility status  PT - Assessment/Plan  PT Plan Current plan remains appropriate  PT Frequency 7X/week  Follow Up Recommendations Home health PT  PT equipment None recommended by PT  PT Goal Progression   Progress towards PT goals Progressing toward goals  Acute Rehab PT Goals  Time For Goal Achievement 06/30/13  Potential to Achieve Goals Good  PT General Charges  $$ ACUTE PT VISIT 1 Procedure  PT Treatments  $Gait Training 8-22 mins

## 2013-06-17 NOTE — Progress Notes (Signed)
Patient ID: Hunter Santos, male   DOB: Aug 17, 1950, 62 y.o.   MRN: [id number]         [facility]    Date of Admission:  06/15/2013   Total days of antibiotics 2         Principal Problem:   Infection of prosthetic left knee joint Active Problems:   S/P left knee resection, abx spacer   Expected blood loss anemia   Obese   Hyponatremia   DM (diabetes mellitus)   HTN (hypertension)   OSA (obstructive sleep apnea)   . amLODipine  10 mg Oral QAC breakfast  . aspirin EC  325 mg Oral BID  . cefTRIAXone (ROCEPHIN)  IV  2 g Intravenous Q24H  . celecoxib  200 mg Oral Q12H  . docusate sodium  100 mg Oral BID  . ferrous sulfate  325 mg Oral TID PC  . furosemide  40 mg Oral QAC breakfast  . glimepiride  4 mg Oral QAC breakfast  . HYDROcodone-acetaminophen  1-2 tablet Oral Q4H  . insulin aspart  0-15 Units Subcutaneous TID WC  . metFORMIN  1,000 mg Oral BID WC  . polyethylene glycol  17 g Oral BID  . simvastatin  20 mg Oral q1800  . vancomycin  1,000 mg Intravenous Q12H    Subjective: He is feeling better.   Past Medical History  Diagnosis Date  . Hypertension   . Diabetes mellitus   . Sleep apnea     had surgery for deviated septum previously  . Arthritis     History  Substance Use Topics  . Smoking status: Never Smoker   . Smokeless tobacco: Never Used  . Alcohol Use: Yes     Comment: rarely    History reviewed. No pertinent family history.  No Known Allergies  Objective: Temp:  [97.9 F (36.6 C)-98.3 F (36.8 C)] 98.3 F (36.8 C) (12/03 1451) Pulse Rate:  [85-100] 94 (12/03 1451) Resp:  [14-18] 16 (12/03 1451) BP: (111-156)/(66-90) 129/70 mmHg (12/03 1451) SpO2:  [94 %-100 %] 94 % (12/03 1451)  General: He is in good spirits watching television Skin: New right arm PICC Left leg and soft brace  Lab Results Lab Results  Component Value Date   WBC 6.9 06/17/2013   HGB 7.0* 06/17/2013   HCT 22.3* 06/17/2013   MCV 77.4*  06/17/2013   PLT 272 06/17/2013    Lab Results  Component Value Date   CREATININE 0.76 06/17/2013   BUN 15 06/17/2013   NA 132* 06/17/2013   K 3.5 06/17/2013   CL 97 06/17/2013   CO2 26 06/17/2013    No results found for this basename: CRP   No results found for this basename: ESRSEDRATE, SEDRATE, POCTSEDRATE      Microbiology: Recent Results (from the past 240 hour(s))  GRAM STAIN     Status: None   Collection Time    06/15/13 10:24 AM      Result Value Range Status   Specimen Description SYNOVIAL LT KNEE   Final   Special Requests NONE   Final   Gram Stain     Final   Value: FEW WBC PRESENT,BOTH PMN AND MONONUCLEAR     NO ORGANISMS SEEN     Gram Stain Report Called to,Read Back By and Verified With: DR, OLIN AT 1111 ON 12.1.14 BY SHUEA   Report Status 06/15/2013 FINAL   Final  ANAEROBIC CULTURE     Status: None   Collection  Time    06/15/13 10:24 AM      Result Value Range Status   Specimen Description SYNOVIAL LT KNEE   Final   Special Requests NONE   Final   Gram Stain     Final   Value: MODERATE WBC PRESENT, PREDOMINANTLY PMN     NO SQUAMOUS EPITHELIAL CELLS SEEN     NO ORGANISMS SEEN     Performed at [facility]   Culture     Final   Value: NO ANAEROBES ISOLATED; CULTURE IN PROGRESS FOR 5 DAYS     Performed at [facility]   Report Status PENDING   Incomplete  BODY FLUID CULTURE     Status: None   Collection Time    06/15/13 10:24 AM      Result Value Range Status   Specimen Description SYNOVIAL LT KNEE   Final   Special Requests NONE   Final   Gram Stain     Final   Value: FEW WBC PRESENT,BOTH PMN AND MONONUCLEAR     NO ORGANISMS SEEN     Gram Stain Report Called to,Read Back By and Verified With: Gram Stain Report Called to,Read Back By and Verified With: DR. Charlann Boxer AT 1111 06/15/13 BY SHUEA Performed by [facility]     Performed at [facility]   Culture     Final   Value: RARE GRAM POSITIVE COCCI     Note: CRITICAL RESULT  CALLED TO, READ BACK BY AND VERIFIED WITH: Rolm Baptise @ 1135 06/17/13 BY KRAWS     Performed at [facility]   Report Status PENDING   Incomplete    Assessment: I reviewed the situation with Dr. Charlann Boxer again today. Apparently he grew methicillin resistant coagulase-negative staph from his outpatient culture several months ago. He is now growing gram-positive cocci from operative cultures I suspect that that will be the same organism. I will narrow his antibiotic therapy to vancomycin alone and plan on 6 weeks of postoperative therapy.  Plan: 1. Continue vancomycin 2. Discontinue ceftriaxone 3. Await final operative cultures 4. Check sedimentation rate and C-reactive protein 5. I will arrange followup in our clinic  Cliffton Asters, MD [facility] [phone] pager   [phone] cell 06/17/2013, 3:37 PM

## 2013-06-17 NOTE — Progress Notes (Signed)
   Subjective: 2 Days Post-Op Procedure(s) (LRB): LEFT RESECTION TOTAL KNEE ARTHROPLASTY WITH PLACEMENT OF ANTIBIOTIC SPACERS (Left)   Patient reports pain as mild, pain controlled. Symptomatic anemia, otherwise no events.   Objective:   VITALS:   Filed Vitals:   06/17/13  BP: 131/82  Pulse: 85  Temp: 97.9 F (36.6 C)   Resp: 18    Neurovascular intact Dorsiflexion/Plantar flexion intact Incision: dressing C/D/I No cellulitis present Compartment soft  LABS  Recent Labs  06/16/13 0510 06/17/13 0530  HGB 7.9* 7.0*  HCT 25.3* 22.3*  WBC 8.8 6.9  PLT 316 272     Recent Labs  06/16/13 0510 06/17/13 0530  NA 129* 132*  K 4.5 3.5  BUN 15 15  CREATININE 0.64 0.76  GLUCOSE 237* 130*     Assessment/Plan: 2 Days Post-Op Procedure(s) (LRB): LEFT RESECTION TOTAL KNEE ARTHROPLASTY WITH PLACEMENT OF ANTIBIOTIC SPACERS (Left) Up with therapy Discharge home with home health eventually, when ready  ABLA  Treated with 2 units of blood today and will observe   Obese (BMI 30-39.9)  Estimated body mass index is 33.62 kg/(m^2) as calculated from the following:      Height as of this encounter: 6\' 2"  (1.88 m).      Weight as of this encounter: 118.842 kg (262 lb).  Patient also counseled that weight may inhibit the healing process  Patient counseled that losing weight will help with future health issues   Hyponatremia  Treated with IV fluids and will observe    Anastasio Auerbach. Leibish Mcgregor   PAC  06/17/2013, 9:00 AM

## 2013-06-17 NOTE — Progress Notes (Signed)
Pt refuses CPAP, RT to monitor and assess as needed.  

## 2013-06-17 NOTE — Progress Notes (Signed)
Physical Therapy Treatment Patient Details Name: Hunter Santos MRN: [id number] DOB: 1951/07/05 Today's Date: 06/17/2013 Time: 3086-5784 PT Time Calculation (min): 26 min  PT Assessment / Plan / Recommendation  History of Present Illness L total knee resection with ABX spacer   PT Comments   Have reviewed precautions for knee with pt again and he verbalizes understanding; Will practice stairs in pm, discussed techniques and techniques done in past with pt; Making good progress  Follow Up Recommendations  Home health PT     Does the patient have the potential to tolerate intense rehabilitation     Barriers to Discharge        Equipment Recommendations  None recommended by PT    Recommendations for Other Services    Frequency 7X/week   Progress towards PT Goals Progress towards PT goals: Progressing toward goals  Plan Current plan remains appropriate    Precautions / Restrictions Precautions Precautions: Other (comment) Precaution Comments: no ROM L knee, per verbal order from [facility], [place]; thorough review of precautions  Required Braces or Orthoses: Knee Immobilizer - Left Knee Immobilizer - Left: On at all times Restrictions Weight Bearing Restrictions: No LLE Weight Bearing: Partial weight bearing LLE Partial Weight Bearing Percentage or Pounds: 50%   Pertinent Vitals/Pain Pain controlled, asymptomatic of decr Hgb    Mobility  Bed Mobility Bed Mobility: Not assessed Transfers Transfers: Sit to Stand;Stand to Sit Sit to Stand: 5: Supervision;From elevated surface;From bed Stand to Sit: 5: Supervision;To chair/3-in-1;With upper extremity assist Details for Transfer Assistance: verbal cues for hand placement Ambulation/Gait Ambulation/Gait Assistance: 4: Min guard;5: Supervision Ambulation Distance (Feet): 110 Feet Assistive device: Rolling walker Ambulation/Gait Assistance Details: verbal cues for PWB and sequence Gait Pattern: Step-to pattern Gait velocity:  decr Stairs:  (verbally reviewed stair techniques, to practice later)    Exercises     PT Diagnosis:    PT Problem List:   PT Treatment Interventions:     PT Goals (current goals can now be found in the care plan section) Acute Rehab PT Goals Patient Stated Goal: return to gardening Time For Goal Achievement: 06/30/13 Potential to Achieve Goals: Good  Visit Information  Last PT Received On: 06/17/13 Assistance Needed: +1 History of Present Illness: L total knee resection with ABX spacer    Subjective Data  Subjective: pt sitting EOB with L knee flexed, no KI Patient Stated Goal: return to gardening   Cognition       Balance     End of Session PT - End of Session Equipment Utilized During Treatment: Gait belt;Left knee immobilizer Activity Tolerance: Patient tolerated treatment well Patient left: with call bell/phone within reach;in bed;with family/visitor present Nurse Communication: Mobility status   GP     [facility] 06/17/2013, 9:37 AM

## 2013-06-18 DIAGNOSIS — A4901 Methicillin susceptible Staphylococcus aureus infection, unspecified site: Secondary | ICD-10-CM

## 2013-06-18 LAB — CBC
HCT: 27.5 % — ABNORMAL LOW (ref 39.0–52.0)
MCV: 78.6 fL (ref 78.0–100.0)
Platelets: 281 10*3/uL (ref 150–400)
RBC: 3.5 MIL/uL — ABNORMAL LOW (ref 4.22–5.81)
WBC: 6.3 10*3/uL (ref 4.0–10.5)

## 2013-06-18 LAB — GLUCOSE, CAPILLARY

## 2013-06-18 LAB — TYPE AND SCREEN
ABO/RH(D): AB POS
Unit division: 0

## 2013-06-18 LAB — C-REACTIVE PROTEIN: CRP: 4.4 mg/dL — ABNORMAL HIGH (ref <0.60)

## 2013-06-18 LAB — BODY FLUID CULTURE

## 2013-06-18 LAB — SEDIMENTATION RATE: Sed Rate: 36 mm/hr — ABNORMAL HIGH (ref 0–16)

## 2013-06-18 MED ORDER — METHOCARBAMOL 500 MG PO TABS: 500.0000 mg | ORAL_TABLET | Freq: Four times a day (QID) | ORAL | Status: DC | PRN

## 2013-06-18 MED ORDER — ASPIRIN 325 MG PO TBEC: 325.0000 mg | DELAYED_RELEASE_TABLET | Freq: Two times a day (BID) | ORAL | Status: AC

## 2013-06-18 MED ORDER — HYDROCODONE-ACETAMINOPHEN 7.5-325 MG PO TABS: 1.0000 | ORAL_TABLET | ORAL | Status: DC | PRN

## 2013-06-18 MED ORDER — POLYETHYLENE GLYCOL 3350 17 G PO PACK: 17.0000 g | PACK | Freq: Two times a day (BID) | ORAL | Status: DC

## 2013-06-18 MED ORDER — HEPARIN SOD (PORK) LOCK FLUSH 100 UNIT/ML IV SOLN
250.0000 [IU] | Freq: Every day | INTRAVENOUS | Status: DC
  Filled 2013-06-18: qty 3

## 2013-06-18 MED ORDER — HEPARIN SOD (PORK) LOCK FLUSH 100 UNIT/ML IV SOLN
250.0000 [IU] | INTRAVENOUS | Status: DC | PRN
  Administered 2013-06-18: 14:00:00 250 [IU]
  Filled 2013-06-18: qty 3

## 2013-06-18 MED ORDER — VANCOMYCIN HCL IN DEXTROSE 1-5 GM/200ML-% IV SOLN: 1000.0000 mg | Freq: Two times a day (BID) | INTRAVENOUS | Status: DC

## 2013-06-18 MED ORDER — FERROUS SULFATE 325 (65 FE) MG PO TABS: 325.0000 mg | ORAL_TABLET | Freq: Three times a day (TID) | ORAL | Status: DC

## 2013-06-18 MED ORDER — DSS 100 MG PO CAPS: 100.0000 mg | ORAL_CAPSULE | Freq: Two times a day (BID) | ORAL | Status: DC

## 2013-06-18 NOTE — Progress Notes (Signed)
OT Cancellation Note and Discharge   Patient Details Name: Hunter Santos MRN: [id number] DOB: 10/29/1950   Cancelled Treatment:    Reason Eval/Treat Not Completed: Other (comment)  Checked on pt this am.  He has used a Sports administrator before for donning pants (and has one at home).  Recommended he put loose pants over KI as he wears this all the time.  Pt feels comfortable with transfers to 3:1 and verbalizes PWB.  He plans to sponge bathe due to PICC line.  Will sign off.   [place] 06/18/2013, 10:53 AM Marica Otter, OTR/L [phone] 06/18/2013

## 2013-06-18 NOTE — Progress Notes (Signed)
Patient ID: Hunter Santos, male   DOB: 05-24-1951, 62 y.o.   MRN: [id number]         [facility]    Date of Admission:  06/15/2013   Total days of antibiotics 3         Principal Problem:   Infection of prosthetic left knee joint Active Problems:   S/P left knee resection, abx spacer   Expected blood loss anemia   Obese   Hyponatremia   DM (diabetes mellitus)   HTN (hypertension)   OSA (obstructive sleep apnea)   . amLODipine  10 mg Oral QAC breakfast  . aspirin EC  325 mg Oral BID  . celecoxib  200 mg Oral Q12H  . docusate sodium  100 mg Oral BID  . ferrous sulfate  325 mg Oral TID PC  . furosemide  40 mg Oral QAC breakfast  . glimepiride  4 mg Oral QAC breakfast  . heparin lock flush  250 Units Intracatheter Daily  . HYDROcodone-acetaminophen  1-2 tablet Oral Q4H  . insulin aspart  0-15 Units Subcutaneous TID WC  . metFORMIN  1,000 mg Oral BID WC  . polyethylene glycol  17 g Oral BID  . simvastatin  20 mg Oral q1800  . vancomycin  1,000 mg Intravenous Q12H    Subjective: He is feeling much better and preparing to go home.  Past Medical History  Diagnosis Date  . Hypertension   . Diabetes mellitus   . Sleep apnea     had surgery for deviated septum previously  . Arthritis     History  Substance Use Topics  . Smoking status: Never Smoker   . Smokeless tobacco: Never Used  . Alcohol Use: Yes     Comment: rarely    History reviewed. No pertinent family history.  No Known Allergies  Objective: Temp:  [97.6 F (36.4 C)-98.3 F (36.8 C)] 97.9 F (36.6 C) (12/04 0643) Pulse Rate:  [75-94] 78 (12/04 0738) Resp:  [14-16] 16 (12/04 0643) BP: (129-138)/(70-88) 132/70 mmHg (12/04 0738) SpO2:  [94 %-98 %] 98 % (12/04 0643)  General: He is smiling and in good spirits seated in wheelchair preparing to leave Skin: PICC site appears normal  Lab Results Lab Results  Component Value Date   WBC 6.3 06/18/2013   HGB 9.1* 06/18/2013    HCT 27.5* 06/18/2013   MCV 78.6 06/18/2013   PLT 281 06/18/2013    Lab Results  Component Value Date   CREATININE 0.76 06/17/2013   BUN 15 06/17/2013   NA 132* 06/17/2013   K 3.5 06/17/2013   CL 97 06/17/2013   CO2 26 06/17/2013    No results found for this basename: ALT, AST, GGT, ALKPHOS, BILITOT      Microbiology: Recent Results (from the past 240 hour(s))  GRAM STAIN     Status: None   Collection Time    06/15/13 10:24 AM      Result Value Range Status   Specimen Description SYNOVIAL LT KNEE   Final   Special Requests NONE   Final   Gram Stain     Final   Value: FEW WBC PRESENT,BOTH PMN AND MONONUCLEAR     NO ORGANISMS SEEN     Gram Stain Report Called to,Read Back By and Verified With: DR, OLIN AT 1111 ON 12.1.14 BY SHUEA   Report Status 06/15/2013 FINAL   Final  ANAEROBIC CULTURE     Status: None   Collection Time  06/15/13 10:24 AM      Result Value Range Status   Specimen Description SYNOVIAL LT KNEE   Final   Special Requests NONE   Final   Gram Stain     Final   Value: MODERATE WBC PRESENT, PREDOMINANTLY PMN     NO SQUAMOUS EPITHELIAL CELLS SEEN     NO ORGANISMS SEEN     Performed at [facility]   Culture     Final   Value: NO ANAEROBES ISOLATED; CULTURE IN PROGRESS FOR 5 DAYS     Performed at [facility]   Report Status PENDING   Incomplete  BODY FLUID CULTURE     Status: None   Collection Time    06/15/13 10:24 AM      Result Value Range Status   Specimen Description SYNOVIAL LT KNEE   Final   Special Requests NONE   Final   Gram Stain     Final   Value: FEW WBC PRESENT,BOTH PMN AND MONONUCLEAR     NO ORGANISMS SEEN     Gram Stain Report Called to,Read Back By and Verified With: Gram Stain Report Called to,Read Back By and Verified With: DR. Charlann Boxer AT 1111 06/15/13 BY SHUEA Performed by [facility]     Performed at [facility]   Culture     Final   Value: RARE STAPHYLOCOCCUS SPECIES (COAGULASE NEGATIVE)     Note:  CRITICAL RESULT CALLED TO, READ BACK BY AND VERIFIED WITH: KATIE F @ 1135 06/17/13 BY KRAWS     Performed at [facility]   Report Status 06/18/2013 FINAL   Final   Organism ID, Bacteria STAPHYLOCOCCUS SPECIES (COAGULASE NEGATIVE)   Final    Assessment: The operative specimen from his left knee he has grown a methicillin sensitive coagulase-negative staph. Have not been able to locate the hard copy of his outpatient culture report but it sounded like he had grown methicillin-resistant coagulase-negative staph several months ago. I will keep him on IV vancomycin for now but will try to obtain those culture reports and the side alone if he can be changed to IV cefazolin as an outpatient.  Plan: 1. Continue vancomycin for now 2. Reconcile outpatient culture results will operative culture results and decide on ongoing antibiotic therapy 3. I will arrange followup in our clinic within the next few weeks  Cliffton Asters, MD [facility] for Infectious Disease [facility] [phone] pager   [phone] cell 06/18/2013, 2:33 PM

## 2013-06-18 NOTE — Progress Notes (Addendum)
ANTIBIOTIC CONSULT NOTE   Pharmacy Consult for Vancomycin Indication: Infected prosthetic knee  No Known Allergies  Patient Measurements: Height: 6\' 2"  (188 cm) Weight: 262 lb (118.842 kg) IBW/kg (Calculated) : 82.2  Vital Signs: Temp: 97.9 F (36.6 C) (12/04 0643) Temp src: Oral (12/04 0643) BP: 132/70 mmHg (12/04 0738) Pulse Rate: 78 (12/04 0738) Intake/Output from previous day: 12/03 0701 - 12/04 0700 In: 2417.5 [P.O.:840; I.V.:240; Blood:737.5; IV Piggyback:600] Out: 1780 [Urine:1780] Intake/Output from this shift: Total I/O In: 240 [P.O.:240] Out: 300 [Urine:300]  Labs:  Recent Labs  06/16/13 0510 06/17/13 0530 06/18/13 0511  WBC 8.8 6.9 6.3  HGB 7.9* 7.0* 9.1*  PLT 316 272 281  CREATININE 0.64 0.76  --    Estimated Creatinine Clearance: 131.1 ml/min (by C-G formula based on Cr of 0.76). No results found for this basename: VANCOTROUGH, Leodis Binet, VANCORANDOM, GENTTROUGH, GENTPEAK, GENTRANDOM, TOBRATROUGH, TOBRAPEAK, TOBRARND, AMIKACINPEAK, AMIKACINTROU, AMIKACIN,  in the last 72 hours   Microbiology: Recent Results (from the past 720 hour(s))  SURGICAL PCR SCREEN     Status: None   Collection Time    06/03/13  8:15 AM      Result Value Range Status   MRSA, PCR NEGATIVE  NEGATIVE Final   Staphylococcus aureus NEGATIVE  NEGATIVE Final   Comment:            The Xpert SA Assay (FDA     approved for NASAL specimens     in patients over 49 years of age),     is one component of     a comprehensive surveillance     program.  Test performance has     been validated by [employer] for patients greater     than or equal to 41 year old.     It is not intended     to diagnose infection nor to     guide or monitor treatment.  GRAM STAIN     Status: None   Collection Time    06/15/13 10:24 AM      Result Value Range Status   Specimen Description SYNOVIAL LT KNEE   Final   Special Requests NONE   Final   Gram Stain     Final   Value: FEW WBC  PRESENT,BOTH PMN AND MONONUCLEAR     NO ORGANISMS SEEN     Gram Stain Report Called to,Read Back By and Verified With: DR, OLIN AT 1111 ON 12.1.14 BY SHUEA   Report Status 06/15/2013 FINAL   Final  ANAEROBIC CULTURE     Status: None   Collection Time    06/15/13 10:24 AM      Result Value Range Status   Specimen Description SYNOVIAL LT KNEE   Final   Special Requests NONE   Final   Gram Stain     Final   Value: MODERATE WBC PRESENT, PREDOMINANTLY PMN     NO SQUAMOUS EPITHELIAL CELLS SEEN     NO ORGANISMS SEEN     Performed at [facility]   Culture     Final   Value: NO ANAEROBES ISOLATED; CULTURE IN PROGRESS FOR 5 DAYS     Performed at [facility]   Report Status PENDING   Incomplete  BODY FLUID CULTURE     Status: None   Collection Time    06/15/13 10:24 AM      Result Value Range Status   Specimen Description SYNOVIAL LT KNEE   Final  Special Requests NONE   Final   Gram Stain     Final   Value: FEW WBC PRESENT,BOTH PMN AND MONONUCLEAR     NO ORGANISMS SEEN     Gram Stain Report Called to,Read Back By and Verified With: Gram Stain Report Called to,Read Back By and Verified With: DR. Charlann Boxer AT 1111 06/15/13 BY SHUEA Performed by [facility]     Performed at [facility]   Culture     Final   Value: RARE STAPHYLOCOCCUS SPECIES (COAGULASE NEGATIVE)     Note: CRITICAL RESULT CALLED TO, READ BACK BY AND VERIFIED WITH: KATIE F @ 1135 06/17/13 BY KRAWS     Performed at [facility]   Report Status 06/18/2013 FINAL   Final   Organism ID, Bacteria STAPHYLOCOCCUS SPECIES (COAGULASE NEGATIVE)   Final    Medical History: Past Medical History  Diagnosis Date  . Hypertension   . Diabetes mellitus   . Sleep apnea     had surgery for deviated septum previously  . Arthritis     Medications:  Scheduled:  . amLODipine  10 mg Oral QAC breakfast  . aspirin EC  325 mg Oral BID  . celecoxib  200 mg Oral Q12H  . docusate sodium  100 mg Oral BID   . ferrous sulfate  325 mg Oral TID PC  . furosemide  40 mg Oral QAC breakfast  . glimepiride  4 mg Oral QAC breakfast  . HYDROcodone-acetaminophen  1-2 tablet Oral Q4H  . insulin aspart  0-15 Units Subcutaneous TID WC  . metFORMIN  1,000 mg Oral BID WC  . polyethylene glycol  17 g Oral BID  . simvastatin  20 mg Oral q1800  . vancomycin  1,000 mg Intravenous Q12H   Infusions:  . sodium chloride 0.9 % 1,000 mL with potassium chloride 10 mEq infusion 20 mL/hr at 06/16/13 1400   Assessment: 62 yr obese male s/p resection of left TKA with antibiotic spacer placement on 06/15/13. ID consulted and stopped ceftriaxone 12/3.  Plan 6 weeks of antibiotics  12/2 >>Vanc >> 12/2 >>Rocephin >> 12/3  Tmax: AFebrile WBCs: 9.1 Renal: Scr = 0.76 with CrCl (normalized) > 148ml/min CRP = 4.4 (elevated)  12/1 synovial fluid: methicillin-susceptible CoNS Per ID note, outpatient culture grew methicillin resistant CoNS   Goal of Therapy:  Vancomycin trough level 15-20 mcg/ml  Plan:  Day #3 vancomycin  Continue vancomycin 1gm IV q12h (empiric dosing per pharmacy's dosing nomogram for wt > 100kg)  If not discharged prior to this evening's dose, check vancomycin trough (prior to 4th dose of 1gm IV q12h, prior to 5th dose overall as received load on 2500mg  IV 12/2)  Juliette Alcide, PharmD, BCPS.   Pager: [phone] 06/18/2013,11:41 AM

## 2013-06-18 NOTE — Progress Notes (Signed)
Physical Therapy Treatment Patient Details Name: Hunter Santos MRN: [id number] DOB: [birth year] Today's Date: 06/18/2013 Time: 7829-5621 PT Time Calculation (min): 26 min  PT Assessment / Plan / Recommendation  History of Present Illness L total knee resection with ABX spacer   PT Comments   Pt assisted with adjusting KI to appropriate position prior to mobilizing.  Pt ambulated in hallway maintaining all precaution and practiced stairs with crutch and rail (handout provided).  Discussed safe car transfer.  Pt able to repeat back all precautions and safe stair sequence without assist.  Pt ready to d/c home.   Follow Up Recommendations  Home health PT     Does the patient have the potential to tolerate intense rehabilitation     Barriers to Discharge        Equipment Recommendations  None recommended by PT    Recommendations for Other Services    Frequency 7X/week   Progress towards PT Goals Progress towards PT goals: Progressing toward goals  Plan Current plan remains appropriate    Precautions / Restrictions Precautions Precautions: Other (comment) Precaution Comments: no ROM L knee, per verbal order from [facility] [place]; thorough review of precautions  Required Braces or Orthoses: Knee Immobilizer - Left Knee Immobilizer - Left: On at all times Restrictions Weight Bearing Restrictions: Yes LLE Weight Bearing: Partial weight bearing LLE Partial Weight Bearing Percentage or Pounds: 50%   Pertinent Vitals/Pain States minimal pain with mobility however tolerable    Mobility  Bed Mobility Bed Mobility: Supine to Sit Supine to Sit: 6: Modified independent (Device/Increase time);HOB elevated Transfers Transfers: Sit to Stand;Stand to Sit Sit to Stand: 5: Supervision;With upper extremity assist;From bed Stand to Sit: 5: Supervision;To chair/3-in-1;With upper extremity assist Details for Transfer Assistance: verbal cues for hand placement Ambulation/Gait Ambulation/Gait  Assistance: 5: Supervision Ambulation Distance (Feet): 340 Feet Assistive device: Rolling walker Ambulation/Gait Assistance Details: verbal cues for safe RW distance Gait Pattern: Step-to pattern Gait velocity: decr Stairs: Yes Stairs Assistance: 4: Min guard Stairs Assistance Details (indicate cue type and reason): verbal cues for safe technique, sequence, crutch placement, practiced with rail and crutch as pt states he has crutch at home and would like try this technique, handout provided Stair Management Technique: One rail Right;With crutches Number of Stairs: 4    Exercises     PT Diagnosis:    PT Problem List:   PT Treatment Interventions:     PT Goals (current goals can now be found in the care plan section)    Visit Information  Last PT Received On: 06/18/13 Assistance Needed: +1 History of Present Illness: L total knee resection with ABX spacer    Subjective Data      Cognition  Cognition Arousal/Alertness: Awake/alert Behavior During Therapy: WFL for tasks assessed/performed Overall Cognitive Status: Within Functional Limits for tasks assessed    Balance     End of Session PT - End of Session Equipment Utilized During Treatment: Left knee immobilizer Activity Tolerance: Patient tolerated treatment well Patient left: in chair;with call bell/phone within reach;with family/visitor present   GP     Jelani Trueba,[name] 06/18/2013, 11:36 AM Zenovia Jarred, PT, DPT 06/18/2013 Pager: [phone]

## 2013-06-18 NOTE — Progress Notes (Signed)
   Subjective: 3 Days Post-Op Procedure(s) (LRB): LEFT RESECTION TOTAL KNEE ARTHROPLASTY WITH PLACEMENT OF ANTIBIOTIC SPACERS (Left)   Patient reports pain as mild, pain controlled. No events throughout the night. Feels better after receiving 2 units of blood yesterday. Ready to be discharged home.  Objective:   VITALS:   Filed Vitals:   06/18/13  BP: 132/70  Pulse: 78  Temp: 97.9 F (36.6 C)   Resp: 16    Neurovascular intact Dorsiflexion/Plantar flexion intact Incision: dressing C/D/I No cellulitis present Compartment soft  LABS  Recent Labs  06/16/13 0510 06/17/13 0530 06/18/13 0511  HGB 7.9* 7.0* 9.1*  HCT 25.3* 22.3* 27.5*  WBC 8.8 6.9 6.3  PLT 316 272 281     Recent Labs  06/16/13 0510 06/17/13 0530  NA 129* 132*  K 4.5 3.5  BUN 15 15  CREATININE 0.64 0.76  GLUCOSE 237* 130*     Assessment/Plan: 3 Days Post-Op Procedure(s) (LRB): LEFT RESECTION TOTAL KNEE ARTHROPLASTY WITH PLACEMENT OF ANTIBIOTIC SPACERS (Left) Feels better after receiving blood Dressing changed Up with therapy Discharge home with home health Follow up in 2 weeks at [facility]. Follow up with OLIN,Karlye Ihrig D in 2 weeks.  Contact information:  [facility] [address], [address] [place] [address] [phone]    ID Recommendations 1. Continue vancomycin 2. Discontinue ceftriaxone 3. Await final operative cultures 4. Check sedimentation rate and C-reactive protein 5. I will arrange followup in our clinic   ABLA  Treated with 2 units of blood yesterday and will be placed on iron tid for 2-3 weeks  Obese (BMI 30-39.9)  Estimated body mass index is 33.62 kg/(m^2) as calculated from the following:      Height as of this encounter: 6\' 2"  (1.88 m).      Weight as of this encounter: 118.842 kg (262 lb).  Patient also counseled that weight may inhibit the healing process  Patient counseled that losing weight will help with  future health issues        Anastasio Auerbach. Toua Stites   PAC  06/18/2013, 9:08 AM

## 2013-06-20 LAB — ANAEROBIC CULTURE

## 2013-06-28 NOTE — Discharge Summary (Signed)
Physician Discharge Summary  Patient ID: Hunter Santos MRN: [id number] DOB/AGE: 11-05-1950 62 y.o.  Admit date: 06/15/2013 Discharge date: 06/18/2013   Procedures:  Procedure(s) (LRB): LEFT RESECTION TOTAL KNEE ARTHROPLASTY WITH PLACEMENT OF ANTIBIOTIC SPACERS (Left)  Attending Physician:  Hunter Santos   Admission Diagnoses:   Infected left total knee arthroplasty  Discharge Diagnoses:  Principal Problem:   Infection of prosthetic left knee joint Active Problems:   Expected blood loss anemia   Obese   Hyponatremia   S/P left knee resection, abx spacer   DM (diabetes mellitus)   HTN (hypertension)   OSA (obstructive sleep apnea)  Past Medical History  Diagnosis Date  . Hypertension   . Diabetes mellitus   . Sleep apnea     had surgery for deviated septum previously  . Arthritis     HPI: Pt is a 62 y.o. male who is extremely pleasant. He recently presented to the clinic after doing initially exceptionally well from a knee replacement done a year ago (04/29/2012). He had no problems with wound issues. Initially he had no problems postoperative course and had been doing very well until recently. He noted pain and swelling and returned to the clinic about 1 month ago, Hunter Santos aspirated his knee at that time. On work up C-reactive protein of 8.4. On aspiration he was noted to have 55,000 white cells with 95% neutrophils. These are all findings consistent with infection. Unfortunately, for Hunter Santos the etiology of this is uncertain. He had some dental work recently, but used antibiotic prophylaxis for two days. Hunter Santos discussed the whole 2 stage process with the patient and he understands. Risks, benefits and expectations were discussed with the patient. Risks including but not limited to the risk of anesthesia, blood clots, nerve damage, blood vessel damage, failure of the prosthesis, infection and up to and including death. Patient understand the risks, benefits and  expectations and wishes to proceed with surgery.  PCP: Hunter Sites, MD   Discharged Condition: good  Hospital Course:  Patient underwent the above stated procedure on 06/15/2013. Patient tolerated the procedure well and brought to the recovery room in good condition and subsequently to the floor.  POD #1 BP: 127/75 ; Pulse: 85 ; Temp: 97.8 F (36.6 C) ; Resp: 16  Pt's foley was removed, as well as the hemovac drain removed. IV was changed to a saline lock. Patient reports pain as mild, pain controlled. No events throughout the night. Neurovascular intact, dorsiflexion/plantar flexion intact, incision: dressing C/D/I, no cellulitis present and compartment soft.   LABS  Basename    HGB  7.9  HCT  25.3   POD #2  BP: 131/82 ; Pulse: 85 ; Temp: 97.9 F (36.6 C) ; Resp: 18  Patient reports pain as mild, pain controlled. Symptomatic anemia, otherwise no events.  Received 2 units of blood. Neurovascular intact, dorsiflexion/plantar flexion intact, incision: dressing C/D/I, no cellulitis present and compartment soft.   LABS  Basename    HGB  7.0  HCT  22.3   POD #3  BP: 132/70 ; Pulse: 78 ; Temp: 97.9 F (36.6 C) ; Resp: 16  Patient reports pain as mild, pain controlled. No events throughout the night. Feels better after receiving 2 units of blood yesterday. Ready to be discharged home. Neurovascular intact, dorsiflexion/plantar flexion intact, incision: dressing C/D/I, no cellulitis present and compartment soft.   LABS  Basename    HGB  9.1  HCT  27.5   Discharge Exam:  General appearance: alert, cooperative and no distress Extremities: Homans sign is negative, no sign of DVT, no edema, redness or tenderness in the calves or thighs and no ulcers, gangrene or trophic changes  Disposition:    Home-Health Care Svc with follow up in 2 weeks   Follow-up Information   Follow up with Hunter Pal, MD. Schedule an appointment as soon as possible for a visit in 2 weeks.    Specialty:  Orthopedic Surgery   Contact information:   [address] [place] [address] [phone]       Call Hunter Asters, MD. (Schedule an appointment in 2-3 weeks.)    Specialty:  Infectious Diseases   Contact information:   [address] [place] [address] [phone]       Discharge Orders   Future Appointments Provider Department Dept Phone   06/29/2013 3:30 PM Hunter Hiss, MD [facility] [phone]   Future Orders Complete By Expires   Call MD / Call 911  As directed    Comments:     If you experience chest pain or shortness of breath, CALL 911 and be transported to the hospital emergency room.  If you develope a fever above 101 F, pus (white drainage) or increased drainage or redness at the wound, or calf pain, call your surgeon's office.   Change dressing  As directed    Comments:     Maintain surgical dressing for 10-14 days, then change the dressing daily with sterile 4 x 4 inch gauze dressing and tape. Keep the area dry and clean.   Constipation Prevention  As directed    Comments:     Drink plenty of fluids.  Prune juice may be helpful.  You may use a stool softener, such as Colace (over the counter) 100 mg twice a day.  Use MiraLax (over the counter) for constipation as needed.   Diet - low sodium heart healthy  As directed    Discharge instructions  As directed    Comments:     Maintain surgical dressing for 10-14 days, then replace with gauze and tape. Keep the area dry and clean until follow up. Follow up in 2 weeks at [facility]. Call with any questions or concerns.   Increase activity slowly as tolerated  As directed    TED hose  As directed    Comments:     Use stockings (TED hose) for 2 weeks on both leg(s).  You may remove them at night for sleeping.   Weight bearing as tolerated  As directed    Questions:     Laterality:     Extremity:            Medication List    STOP taking these medications       meloxicam 15 MG tablet  Commonly known as:  MOBIC      TAKE these medications       amLODipine 10 MG tablet  Commonly known as:  NORVASC  Take 10 mg by mouth daily before breakfast.     aspirin 325 MG EC tablet  Take 1 tablet (325 mg total) by mouth 2 (two) times daily.     DSS 100 MG Caps  Take 100 mg by mouth 2 (two) times daily.     ferrous sulfate 325 (65 FE) MG tablet  Take 1 tablet (325 mg total) by mouth 3 (three) times daily after meals.  furosemide 40 MG tablet  Commonly known as:  LASIX  Take 40 mg by mouth daily before breakfast.     glimepiride 4 MG tablet  Commonly known as:  AMARYL  Take 4 mg by mouth daily before breakfast.     HYDROcodone-acetaminophen 7.5-325 MG per tablet  Commonly known as:  NORCO  Take 1-2 tablets by mouth every 4 (four) hours as needed for moderate pain.     metFORMIN 1000 MG tablet  Commonly known as:  GLUCOPHAGE  Take 1,000 mg by mouth 2 (two) times daily with a meal.     methocarbamol 500 MG tablet  Commonly known as:  ROBAXIN  Take 1 tablet (500 mg total) by mouth every 6 (six) hours as needed for muscle spasms.     polyethylene glycol packet  Commonly known as:  MIRALAX / GLYCOLAX  Take 17 g by mouth 2 (two) times daily.     pravastatin 40 MG tablet  Commonly known as:  PRAVACHOL  Take 40 mg by mouth every evening.     ramipril 10 MG tablet  Commonly known as:  ALTACE  Take 10 mg by mouth daily before breakfast.     vancomycin 1 GM/200ML Soln  Commonly known as:  VANCOCIN  Inject 200 mLs (1,000 mg total) into the vein every 12 (twelve) hours.         Signed: Anastasio Auerbach. Averiana Clouatre   PAC  06/28/2013, 6:26 PM

## 2013-06-29 ENCOUNTER — Encounter: Payer: Self-pay | Admitting: Infectious Disease

## 2013-06-29 ENCOUNTER — Ambulatory Visit (INDEPENDENT_AMBULATORY_CARE_PROVIDER_SITE_OTHER): Payer: BC Managed Care – PPO | Admitting: Infectious Disease

## 2013-06-29 VITALS — BP 121/78 | HR 103 | Temp 97.9°F | Wt 252.0 lb

## 2013-06-29 DIAGNOSIS — B957 Other staphylococcus as the cause of diseases classified elsewhere: Secondary | ICD-10-CM

## 2013-06-29 DIAGNOSIS — T8459XS Infection and inflammatory reaction due to other internal joint prosthesis, sequela: Secondary | ICD-10-CM

## 2013-06-29 DIAGNOSIS — T889XXS Complication of surgical and medical care, unspecified, sequela: Secondary | ICD-10-CM

## 2013-06-29 DIAGNOSIS — Z23 Encounter for immunization: Secondary | ICD-10-CM

## 2013-06-29 NOTE — Progress Notes (Signed)
Subjective:    Patient ID: Hunter Santos, male    DOB: 1950/10/18, 62 y.o.   MRN: [id number]  HPI  62 year old male with prosthetic joint infection left side sp removal of prosthetic material. He grew MS-COAG negative staph from OR cultures but allegedly grew MR-COAG negative staph from Dr. Nilsa Nutting office aspirate of knee. My partner Dr. Orvan Falconer saw pt in the hospital and endeavored to find the culture but had been unsuccessful so we do not know if pt truly grew MR-COAG or whether this is chart lore.   Regardless he feels improved vs hospital stay on IV vancomycin though he is "not being a very good pt" and is engaging in more physical activity --"than I should." Pain is worse than preoperatively. He is without fevers, chills, nausea. He is 2 weeks into 6 week IV course    Review of Systems  Constitutional: Negative for fever, chills, diaphoresis, activity change, appetite change, fatigue and unexpected weight change.  HENT: Negative for congestion, rhinorrhea, sinus pressure, sneezing, sore throat and trouble swallowing.   Eyes: Negative for photophobia and visual disturbance.  Respiratory: Negative for cough, chest tightness, shortness of breath, wheezing and stridor.   Cardiovascular: Negative for chest pain, palpitations and leg swelling.  Gastrointestinal: Negative for nausea, vomiting, abdominal pain, diarrhea, constipation, blood in stool, abdominal distention and anal bleeding.  Genitourinary: Negative for dysuria, hematuria, flank pain and difficulty urinating.  Musculoskeletal: Positive for arthralgias and joint swelling. Negative for back pain, gait problem and myalgias.  Skin: Negative for color change, pallor, rash and wound.  Neurological: Negative for dizziness, tremors, weakness and light-headedness.  Hematological: Negative for adenopathy. Does not bruise/bleed easily.  Psychiatric/Behavioral: Negative for behavioral problems, confusion, sleep disturbance, dysphoric  mood, decreased concentration and agitation.       Objective:   Physical Exam  Constitutional: He is oriented to person, place, and time. He appears well-developed and well-nourished. No distress.  HENT:  Head: Normocephalic and atraumatic.  Mouth/Throat: Oropharynx is clear and moist. No oropharyngeal exudate.  Eyes: Conjunctivae and EOM are normal. Pupils are equal, round, and reactive to light. No scleral icterus.  Neck: Normal range of motion. Neck supple. No JVD present.  Cardiovascular: Normal rate, regular rhythm and normal heart sounds.  Exam reveals no gallop and no friction rub.   No murmur heard. Pulmonary/Chest: Effort normal and breath sounds normal. No respiratory distress. He has no wheezes. He has no rales. He exhibits no tenderness.  Abdominal: He exhibits no distension and no mass. There is no tenderness. There is no rebound and no guarding.  Musculoskeletal: He exhibits no edema and no tenderness.       Legs: Lymphadenopathy:    He has no cervical adenopathy.  Neurological: He is alert and oriented to person, place, and time. He has normal reflexes. He exhibits normal muscle tone. Coordination normal.  Skin: Skin is warm and dry. He is not diaphoretic. No erythema. No pallor.  Psychiatric: He has a normal mood and affect. His behavior is normal. Judgment and thought content normal.          Assessment & Plan:   Left prosthetic knee with MS vs MR coag negative staph: I am endeavoring to get the cultures from Dr. Nilsa Nutting office. If this is indeed MS-COag neg staph would change to IV ancef 2 g IV q 8 hours to finish total of 6 weeks  I spent greater than 25 minutes with the patient including greater than 50% of  time in face to face counsel of the patient and in coordination of their care.   I will plan on seeing him back and rechecking ESR, CRP prior to completing his IV abx  I would consider aspirate of the joint several weeks AFTER abx for cell count and  differential, gs and culture to ensure cure prior to re-implantation but would defer to Dr Charlann Boxer as there are not fixed guidelines re this practice   Need for influenza vaccine: pt refused flu vaccine today. He is not allergic to eggs. Will try to convince him again at next visit.

## 2013-07-27 ENCOUNTER — Ambulatory Visit (INDEPENDENT_AMBULATORY_CARE_PROVIDER_SITE_OTHER): Payer: BC Managed Care – PPO | Admitting: Infectious Disease

## 2013-07-27 ENCOUNTER — Encounter: Payer: Self-pay | Admitting: Infectious Disease

## 2013-07-27 VITALS — BP 130/78 | HR 115 | Temp 97.5°F | Wt 260.0 lb

## 2013-07-27 DIAGNOSIS — T8454XS Infection and inflammatory reaction due to internal left knee prosthesis, sequela: Secondary | ICD-10-CM

## 2013-07-27 DIAGNOSIS — Z23 Encounter for immunization: Secondary | ICD-10-CM

## 2013-07-27 DIAGNOSIS — B957 Other staphylococcus as the cause of diseases classified elsewhere: Secondary | ICD-10-CM

## 2013-07-27 DIAGNOSIS — T889XXS Complication of surgical and medical care, unspecified, sequela: Secondary | ICD-10-CM

## 2013-07-27 NOTE — Progress Notes (Signed)
Subjective:    Patient ID: Hunter Santos, male    DOB: 20-Feb-1951, 63 y.o.   MRN: [id number]  HPI   63 year old male with prosthetic joint infection left side sp removal of prosthetic material. He grew MS-COAG negative staph from OR cultures but allegedly grew MR-COAG negative staph from Dr. Aurea Graff office aspirate of knee. My partner Dr. Megan Salon saw pt in the hospital and endeavored to find the culture but had been unsuccessful so we do not know if pt truly grew MR-COAG or whether this is chart lore.   I finally located the cultures from the knee and a culture obtained on 04/10/1999 4T did incur the to grow a methicillin-resistant coagulase-negative staph. The species was resistant to penicillin resistant to oxacillin sensitive to ciprofloxacin levofloxacin vancomycin clindamycin rifampin and tetracycline. Cell count from that aspirate revealed 35,790 white blood cells of 95% neutrophils.  The patient has nearly completed a six-week course of IV vancomycin which will finish in approximately 3 days' time. He had blood work done today we'll try to add a sedimentation rate and C-reactive protein. His knee hasn't had a dramatically improved pain he still has some pain when he walks and bears weight but no pain at rest. He is without fevers chills or nausea.  PICC CDI, no erythema    Knee still with an effusion nontender though and with clean incision not erythematous      Review of Systems  Constitutional: Negative for fever, chills, diaphoresis, activity change, appetite change, fatigue and unexpected weight change.  HENT: Negative for congestion, rhinorrhea, sinus pressure, sneezing, sore throat and trouble swallowing.   Eyes: Negative for photophobia and visual disturbance.  Respiratory: Negative for cough, chest tightness, shortness of breath, wheezing and stridor.   Cardiovascular: Negative for chest pain, palpitations and leg swelling.  Gastrointestinal: Negative for nausea,  vomiting, abdominal pain, diarrhea, constipation, blood in stool, abdominal distention and anal bleeding.  Genitourinary: Negative for dysuria, hematuria, flank pain and difficulty urinating.  Musculoskeletal: Positive for arthralgias and joint swelling. Negative for back pain, gait problem and myalgias.  Skin: Negative for color change, pallor, rash and wound.  Neurological: Negative for dizziness, tremors, weakness and light-headedness.  Hematological: Negative for adenopathy. Does not bruise/bleed easily.  Psychiatric/Behavioral: Negative for behavioral problems, confusion, sleep disturbance, dysphoric mood, decreased concentration and agitation.       Objective:   Physical Exam  Constitutional: He is oriented to person, place, and time. He appears well-developed and well-nourished. No distress.  HENT:  Head: Normocephalic and atraumatic.  Mouth/Throat: Oropharynx is clear and moist. No oropharyngeal exudate.  Eyes: Conjunctivae and EOM are normal. Pupils are equal, round, and reactive to light. No scleral icterus.  Neck: Normal range of motion. Neck supple. No JVD present.  Cardiovascular: Normal rate, regular rhythm and normal heart sounds.  Exam reveals no gallop and no friction rub.   No murmur heard. Pulmonary/Chest: Effort normal and breath sounds normal. No respiratory distress. He has no wheezes. He has no rales. He exhibits no tenderness.  Abdominal: He exhibits no distension and no mass. There is no tenderness. There is no rebound and no guarding.  Musculoskeletal: He exhibits no edema and no tenderness.       Legs: Lymphadenopathy:    He has no cervical adenopathy.  Neurological: He is alert and oriented to person, place, and time. He has normal reflexes. He exhibits normal muscle tone. Coordination normal.  Skin: Skin is warm and dry. He is not  diaphoretic. No erythema. No pallor.  Psychiatric: He has a normal mood and affect. His behavior is normal. Judgment and thought  content normal.          Assessment & Plan:   Left prosthetic knee with MS coag is negative staph isolated in the OR with prior vs MR coag negative staph isolated Dr.  Aurea Graff office.  --Both organisms are covered by the vancomycin that he will finish in approximately 3 days' time.  We'll check a sedimentation rate and C-reactive protein if these are encouraging we will simply have him stop his IV antibiotics have a PICC line pulled and not give him any pills should the sedimentation rate and C-reactive protein still be elevated I'll place him on oral tetracycline in the form of doxycycline under milligrams twice daily    We'll bring back present for months to reexamine his knee off antibiotics  I would consider aspirate of the joint several weeks AFTER abx for cell count and differential, gs and culture to ensure cure prior to re-implantation but would defer to Dr Alvan Dame as there are not fixed guidelines re this practice   Need for influenza vaccine: pt refused flu vaccine at last visit and again today. He is not allergic to eggs.

## 2013-08-03 ENCOUNTER — Telehealth: Payer: Self-pay | Admitting: *Deleted

## 2013-08-03 NOTE — Telephone Encounter (Signed)
Patient will follow up with you 09/14/13, 1 month after his surgery. Landis Gandy, RN

## 2013-08-03 NOTE — Telephone Encounter (Signed)
Patietn called to confirm it was all right to have his PICC pulled 1/16.  Per chart review, this is fine.  Patient also wanted to confirm the need for oral antibiotics if necessary.  Per Dr. Tommy Medal, his labs show that oral antibiotics are unnecessary at this time.  Left patient message to confirm this. Landis Gandy, RN

## 2013-08-05 ENCOUNTER — Encounter (HOSPITAL_COMMUNITY): Payer: Self-pay | Admitting: Pharmacy Technician

## 2013-08-07 ENCOUNTER — Ambulatory Visit: Payer: BC Managed Care – PPO | Admitting: Infectious Disease

## 2013-08-10 ENCOUNTER — Encounter (INDEPENDENT_AMBULATORY_CARE_PROVIDER_SITE_OTHER): Payer: Self-pay

## 2013-08-10 ENCOUNTER — Encounter (HOSPITAL_COMMUNITY)
Admission: RE | Admit: 2013-08-10 | Discharge: 2013-08-10 | Disposition: A | Discharge status: Home / Self Care | Payer: BC Managed Care – PPO | Source: Ambulatory Visit | Attending: Orthopedic Surgery | Admitting: Orthopedic Surgery

## 2013-08-10 ENCOUNTER — Encounter (HOSPITAL_COMMUNITY): Payer: Self-pay

## 2013-08-10 DIAGNOSIS — Z01818 Encounter for other preprocedural examination: Secondary | ICD-10-CM | POA: Insufficient documentation

## 2013-08-10 DIAGNOSIS — Z01812 Encounter for preprocedural laboratory examination: Secondary | ICD-10-CM | POA: Insufficient documentation

## 2013-08-10 HISTORY — DX: Personal history of other medical treatment: Z92.89

## 2013-08-10 LAB — SURGICAL PCR SCREEN
MRSA, PCR: NEGATIVE
Staphylococcus aureus: NEGATIVE

## 2013-08-10 LAB — PROTIME-INR
INR: 0.96 (ref 0.00–1.49)
Prothrombin Time: 12.6 seconds (ref 11.6–15.2)

## 2013-08-10 LAB — APTT: aPTT: 26 seconds (ref 24–37)

## 2013-08-10 NOTE — Patient Instructions (Addendum)
Hunter Santos  08/10/2013                           YOUR PROCEDURE IS SCHEDULED ON: 08/17/13               PLEASE REPORT TO SHORT STAY CENTER AT : 5:15 AM               CALL THIS NUMBER IF ANY PROBLEMS THE DAY OF SURGERY :               [phone]                      REMEMBER:   Do not eat food or drink liquids AFTER MIDNIGHT   Take these medicines the morning of surgery with A SIP OF WATER: AMLODIPINE   Do not wear jewelry, make-up   Do not wear lotions, powders, or perfumes.   Do not shave legs or underarms 12 hrs. before surgery (men may shave face)  Do not bring valuables to the hospital.  Contacts, dentures or bridgework may not be worn into surgery.  Leave suitcase in the car. After surgery it may be brought to your room.  For patients admitted to the hospital more than one night, checkout time is 11:00                          The day of discharge.   Patients discharged the day of surgery will not be allowed to drive home                             If going home same day of surgery, must have someone stay with you first                           24 hrs at home and arrange for some one to drive you home from hospital.    Special Instructions:   Please read over the following fact sheets that you were given:               1. MRSA  INFORMATION                      2. Gas                3. [place]                                                X_____________________________________________________________________        Failure to follow these instructions may result in cancellation of your surgery

## 2013-08-10 NOTE — Progress Notes (Signed)
08/10/13 0833  OBSTRUCTIVE SLEEP APNEA  Have you ever been diagnosed with sleep apnea through a sleep study? No (had septum surgery to correct )  Do you snore loudly (loud enough to be heard through closed doors)?  1  Do you often feel tired, fatigued, or sleepy during the daytime? 0  Has anyone observed you stop breathing during your sleep? 0  Do you have, or are you being treated for high blood pressure? 1  BMI more than 35 kg/m2? 0  Age over 63 years old? 1  Neck circumference greater than 40 cm/18 inches? 0  Gender: 1  Obstructive Sleep Apnea Score 4  Score 4 or greater  Results sent to PCP

## 2013-08-10 NOTE — Progress Notes (Signed)
EKG / CXR -Nov 2014 on chart from [facility] / [facility] / UA done 07/27/13 on chart from [facility]

## 2013-08-16 NOTE — Anesthesia Preprocedure Evaluation (Addendum)
Anesthesia Evaluation  Patient identified by MRN, date of birth, ID band Patient awake    Reviewed: Allergy & Precautions, H&P , NPO status , Patient's Chart, lab work & pertinent test results  Airway Mallampati: III TM Distance: >3 FB Neck ROM: full    Dental no notable dental hx. (+) Teeth Intact and Dental Advisory Given   Pulmonary neg pulmonary ROS, sleep apnea ,  breath sounds clear to auscultation  Pulmonary exam normal       Cardiovascular Exercise Tolerance: Good hypertension, Pt. on medications negative cardio ROS  Rhythm:regular Rate:Normal  RBBB   Neuro/Psych negative neurological ROS  negative psych ROS   GI/Hepatic negative GI ROS, Neg liver ROS,   Endo/Other  negative endocrine ROSdiabetes, Well Controlled, Type 2, Oral Hypoglycemic Agents  Renal/GU negative Renal ROS  negative genitourinary   Musculoskeletal   Abdominal   Peds  Hematology negative hematology ROS (+) anemia ,   Anesthesia Other Findings   Reproductive/Obstetrics negative OB ROS                         Anesthesia Physical Anesthesia Plan  ASA: III  Anesthesia Plan: Spinal   Post-op Pain Management:    Induction:   Airway Management Planned: Simple Face Mask  Additional Equipment:   Intra-op Plan:   Post-operative Plan:   Informed Consent: I have reviewed the patients History and Physical, chart, labs and discussed the procedure including the risks, benefits and alternatives for the proposed anesthesia with the patient or authorized representative who has indicated his/her understanding and acceptance.   Dental Advisory Given  Plan Discussed with: CRNA and Surgeon  Anesthesia Plan Comments:         Anesthesia Quick Evaluation

## 2013-08-16 NOTE — H&P (Signed)
TOTAL KNEE REVISION ADMISSION H&P  Patient is being admitted for left revision total knee arthroplasty after resection of antibiotic spacer.  Subjective:  Chief Complaint:   S/P infected left knee infection.  HPI: Hunter Santos, 63 y.o. male, has a history of infection in the left total knee arthroplasty.  Patient has undergone 6 weeks of IV antibiotics. The indications for the revision of the total knee arthroplasty are to reimplant a total knee arthroplasty after removal of antibiotic spacer.  Prior procedures on the left knee(s) include arthroplasty.  Patient currently rates pain in the left knee(s) at 4 out of 10 with activity. There is worsening of pain with activity and weight bearing, pain that interferes with activities of daily living, pain with passive range of motion, crepitus and joint swelling.  This condition presents safety issues increasing the risk of falls. Risks, benefits and expectations were discussed with the patient.  Risks including but not limited to the risk of anesthesia, blood clots, nerve damage, blood vessel damage, failure of the prosthesis, infection and up to and including death.  Patient understand the risks, benefits and expectations and wishes to proceed with surgery.   D/C Plans:   Home with HHPT  Post-op Meds:    No Rx given   Tranexamic Acid:   To be given  Decadron:    Not to be given - DM  FYI:    ASA post-op  Norco post-op   Patient Active Problem List   Diagnosis Date Noted  . DM (diabetes mellitus) 06/16/2013  . HTN (hypertension) 06/16/2013  . OSA (obstructive sleep apnea) 06/16/2013  . Infection of prosthetic left knee joint 06/16/2013  . S/P left knee resection, abx spacer 06/15/2013  . Expected blood loss anemia 04/30/2012  . Obese 04/30/2012  . Hyponatremia 04/30/2012   Past Medical History  Diagnosis Date  . Hypertension   . Diabetes mellitus   . Sleep apnea     had surgery for deviated septum previously  . Arthritis   .  History of transfusion     Past Surgical History  Procedure Laterality Date  . Tonsillectomy      as child  . Left ankle  2006  . Nasal septum surgery  2011  . Total knee arthroplasty  04/29/2012    Procedure: TOTAL KNEE ARTHROPLASTY;  Surgeon: Mauri Pole, MD;  Location: [facility];  Service: Orthopedics;  Laterality: Left;  . Excisional total knee arthroplasty Left 06/15/2013    Procedure: LEFT RESECTION TOTAL KNEE ARTHROPLASTY WITH PLACEMENT OF ANTIBIOTIC SPACERS;  Surgeon: Mauri Pole, MD;  Location: [facility];  Service: Orthopedics;  Laterality: Left;    No prescriptions prior to admission   No Known Allergies   History  Substance Use Topics  . Smoking status: Never Smoker   . Smokeless tobacco: Never Used  . Alcohol Use: Yes     Comment: rarely    No family history on file.   Review of Systems  Constitutional: Negative.   HENT: Negative.   Eyes: Negative.   Respiratory: Negative.   Cardiovascular: Negative.   Gastrointestinal: Negative.   Genitourinary: Negative.   Musculoskeletal: Positive for joint pain.  Skin: Negative.   Neurological: Negative.   Endo/Heme/Allergies: Negative.   Psychiatric/Behavioral: Negative.      Objective:  Physical Exam  Constitutional: He is oriented to person, place, and time. He appears well-developed and well-nourished.  HENT:  Head: Normocephalic and atraumatic.  Mouth/Throat: Oropharynx is clear and moist.  Eyes: Pupils are  equal, round, and reactive to light.  Neck: Neck supple. No JVD present. No tracheal deviation present. No thyromegaly present.  Cardiovascular: Normal rate, regular rhythm, normal heart sounds and intact distal pulses.   Respiratory: Effort normal and breath sounds normal. No stridor. No respiratory distress. He has no wheezes.  GI: Soft. There is no tenderness. There is no guarding.  Musculoskeletal:       Left knee: He exhibits decreased range of motion, swelling, laceration (healed) and bony tenderness.  He exhibits no effusion, no ecchymosis, no deformity and no erythema. Tenderness found.  Lymphadenopathy:    He has no cervical adenopathy.  Neurological: He is alert and oriented to person, place, and time.  Skin: Skin is warm and dry.  Psychiatric: He has a normal mood and affect.    Labs:  Estimated body mass index is 32.34 kg/(m^2) as calculated from the following:   Height as of 06/15/13: 6\' 2"  (1.88 m).   Weight as of 06/29/13: 114.306 kg (252 lb).  Imaging Review Plain radiographs previously demonstrate severe degenerative joint disease of the left knee(s). The overall alignment is neutral. The bone quality appears to be good for age and reported activity level. There is a known antibiotic spacer in place now.  Assessment/Plan:  Left revision total knee arthroplasty after resection of antibiotic spacer.    The patient history, physical examination, clinical judgment of the provider and imaging studies are consistent with end stage degenerative joint disease of the left knee(s), previous infected total knee arthroplasty. Revision total knee arthroplasty after removal of antibiotic spacer is deemed medically necessary. The treatment options including medical management, injection therapy, arthroscopy and revision arthroplasty were discussed at length. The risks and benefits of revision total knee arthroplasty were presented and reviewed. The risks due to aseptic loosening, infection, stiffness, patella tracking problems, thromboembolic complications and other imponderables were discussed. The patient acknowledged the explanation, agreed to proceed with the plan and consent was signed. Patient is being admitted for inpatient treatment for surgery, pain control, PT, OT, prophylactic antibiotics, VTE prophylaxis, progressive ambulation and ADL's and discharge planning.The patient is planning to be discharged home with home health services.      West Pugh Jorgina Binning   PAC  08/16/2013, 12:56  PM

## 2013-08-17 ENCOUNTER — Inpatient Hospital Stay (HOSPITAL_COMMUNITY): Payer: BC Managed Care – PPO | Admitting: Anesthesiology

## 2013-08-17 ENCOUNTER — Encounter (HOSPITAL_COMMUNITY): Payer: BC Managed Care – PPO | Admitting: Anesthesiology

## 2013-08-17 ENCOUNTER — Encounter (HOSPITAL_COMMUNITY)
Admission: RE | Disposition: A | Discharge status: Home Health | Payer: Self-pay | Source: Ambulatory Visit | Attending: Orthopedic Surgery

## 2013-08-17 ENCOUNTER — Inpatient Hospital Stay (HOSPITAL_COMMUNITY)
Admission: RE | Admit: 2013-08-17 | Discharge: 2013-08-18 | DRG: 468 | Disposition: A | Discharge status: Home Health | Payer: BC Managed Care – PPO | Source: Ambulatory Visit | Attending: Orthopedic Surgery | Admitting: Orthopedic Surgery

## 2013-08-17 ENCOUNTER — Encounter (HOSPITAL_COMMUNITY): Payer: Self-pay | Admitting: *Deleted

## 2013-08-17 DIAGNOSIS — Z96659 Presence of unspecified artificial knee joint: Secondary | ICD-10-CM

## 2013-08-17 DIAGNOSIS — Y831 Surgical operation with implant of artificial internal device as the cause of abnormal reaction of the patient, or of later complication, without mention of misadventure at the time of the procedure: Secondary | ICD-10-CM | POA: Diagnosis present

## 2013-08-17 DIAGNOSIS — E119 Type 2 diabetes mellitus without complications: Secondary | ICD-10-CM | POA: Diagnosis present

## 2013-08-17 DIAGNOSIS — I10 Essential (primary) hypertension: Secondary | ICD-10-CM | POA: Diagnosis present

## 2013-08-17 DIAGNOSIS — Z6834 Body mass index (BMI) 34.0-34.9, adult: Secondary | ICD-10-CM

## 2013-08-17 DIAGNOSIS — T8450XA Infection and inflammatory reaction due to unspecified internal joint prosthesis, initial encounter: Principal | ICD-10-CM | POA: Diagnosis present

## 2013-08-17 DIAGNOSIS — E669 Obesity, unspecified: Secondary | ICD-10-CM | POA: Diagnosis present

## 2013-08-17 DIAGNOSIS — G4733 Obstructive sleep apnea (adult) (pediatric): Secondary | ICD-10-CM | POA: Diagnosis present

## 2013-08-17 DIAGNOSIS — M1710 Unilateral primary osteoarthritis, unspecified knee: Secondary | ICD-10-CM | POA: Diagnosis present

## 2013-08-17 DIAGNOSIS — Z01812 Encounter for preprocedural laboratory examination: Secondary | ICD-10-CM

## 2013-08-17 HISTORY — PX: TOTAL KNEE REVISION: SHX996

## 2013-08-17 LAB — TYPE AND SCREEN
ABO/RH(D): AB POS
Antibody Screen: NEGATIVE

## 2013-08-17 LAB — GLUCOSE, CAPILLARY
GLUCOSE-CAPILLARY: 145 mg/dL — AB (ref 70–99)
Glucose-Capillary: 169 mg/dL — ABNORMAL HIGH (ref 70–99)
Glucose-Capillary: 176 mg/dL — ABNORMAL HIGH (ref 70–99)
Glucose-Capillary: 247 mg/dL — ABNORMAL HIGH (ref 70–99)

## 2013-08-17 SURGERY — TOTAL KNEE REVISION
Anesthesia: Spinal | Site: Knee | Laterality: Left

## 2013-08-17 MED ORDER — LACTATED RINGERS IV SOLN
INTRAVENOUS | Status: DC | PRN
  Administered 2013-08-17 (×4): via INTRAVENOUS

## 2013-08-17 MED ORDER — BUPIVACAINE HCL (PF) 0.75 % IJ SOLN
INTRAMUSCULAR | Status: DC | PRN
  Administered 2013-08-17: 15 mg via INTRATHECAL

## 2013-08-17 MED ORDER — LACTATED RINGERS IV SOLN: INTRAVENOUS | Status: DC

## 2013-08-17 MED ORDER — KETOROLAC TROMETHAMINE 30 MG/ML IJ SOLN
INTRAMUSCULAR | Status: DC | PRN
  Administered 2013-08-17: 30 mg via INTRAMUSCULAR

## 2013-08-17 MED ORDER — HYDROCODONE-ACETAMINOPHEN 7.5-325 MG PO TABS
1.0000 | ORAL_TABLET | ORAL | Status: DC
  Administered 2013-08-17 – 2013-08-18 (×6): 2 via ORAL
  Filled 2013-08-17 (×6): qty 2

## 2013-08-17 MED ORDER — FENTANYL CITRATE 0.05 MG/ML IJ SOLN
INTRAMUSCULAR | Status: AC
  Filled 2013-08-17: qty 2

## 2013-08-17 MED ORDER — CEFAZOLIN SODIUM-DEXTROSE 2-3 GM-% IV SOLR
INTRAVENOUS | Status: AC
  Filled 2013-08-17: qty 50

## 2013-08-17 MED ORDER — METOCLOPRAMIDE HCL 10 MG PO TABS: 5.0000 mg | ORAL_TABLET | Freq: Three times a day (TID) | ORAL | Status: DC | PRN

## 2013-08-17 MED ORDER — PROPOFOL 10 MG/ML IV BOLUS
INTRAVENOUS | Status: AC
  Filled 2013-08-17: qty 20

## 2013-08-17 MED ORDER — BUPIVACAINE-EPINEPHRINE PF 0.25-1:200000 % IJ SOLN
INTRAMUSCULAR | Status: DC | PRN
  Administered 2013-08-17: 25 mL

## 2013-08-17 MED ORDER — MIDAZOLAM HCL 2 MG/2ML IJ SOLN
INTRAMUSCULAR | Status: AC
  Filled 2013-08-17: qty 2

## 2013-08-17 MED ORDER — SODIUM CHLORIDE 0.9 % IV SOLN
INTRAVENOUS | Status: DC
  Administered 2013-08-17: 13:00:00 via INTRAVENOUS
  Filled 2013-08-17 (×4): qty 1000

## 2013-08-17 MED ORDER — FLEET ENEMA 7-19 GM/118ML RE ENEM: 1.0000 | ENEMA | Freq: Once | RECTAL | Status: AC | PRN

## 2013-08-17 MED ORDER — CEFAZOLIN SODIUM-DEXTROSE 2-3 GM-% IV SOLR
2.0000 g | Freq: Four times a day (QID) | INTRAVENOUS | Status: AC
  Administered 2013-08-17 (×2): 2 g via INTRAVENOUS
  Filled 2013-08-17 (×2): qty 50

## 2013-08-17 MED ORDER — BISACODYL 10 MG RE SUPP: 10.0000 mg | Freq: Every day | RECTAL | Status: DC | PRN

## 2013-08-17 MED ORDER — FENTANYL CITRATE 0.05 MG/ML IJ SOLN
INTRAMUSCULAR | Status: DC | PRN
Start: 2013-08-17 — End: 2013-08-17
  Administered 2013-08-17 (×2): 50 ug via INTRAVENOUS

## 2013-08-17 MED ORDER — METHOCARBAMOL 100 MG/ML IJ SOLN
500.0000 mg | Freq: Four times a day (QID) | INTRAMUSCULAR | Status: DC | PRN
  Administered 2013-08-17: 500 mg via INTRAVENOUS
  Filled 2013-08-17: qty 5

## 2013-08-17 MED ORDER — BUPIVACAINE-EPINEPHRINE PF 0.25-1:200000 % IJ SOLN
INTRAMUSCULAR | Status: AC
Start: 2013-08-17 — End: 2013-08-17
  Filled 2013-08-17: qty 30

## 2013-08-17 MED ORDER — DOCUSATE SODIUM 100 MG PO CAPS
100.0000 mg | ORAL_CAPSULE | Freq: Two times a day (BID) | ORAL | Status: DC
  Administered 2013-08-17 – 2013-08-18 (×2): 100 mg via ORAL

## 2013-08-17 MED ORDER — PHENOL 1.4 % MT LIQD
1.0000 | OROMUCOSAL | Status: DC | PRN
  Filled 2013-08-17: qty 177

## 2013-08-17 MED ORDER — MIDAZOLAM HCL 5 MG/5ML IJ SOLN
INTRAMUSCULAR | Status: DC | PRN
  Administered 2013-08-17: 2 mg via INTRAVENOUS
  Administered 2013-08-17 (×2): 1 mg via INTRAVENOUS

## 2013-08-17 MED ORDER — PROPOFOL INFUSION 10 MG/ML OPTIME
INTRAVENOUS | Status: DC | PRN
  Administered 2013-08-17: 140 ug/kg/min via INTRAVENOUS

## 2013-08-17 MED ORDER — DIPHENHYDRAMINE HCL 25 MG PO CAPS
25.0000 mg | ORAL_CAPSULE | Freq: Four times a day (QID) | ORAL | Status: DC | PRN
  Filled 2013-08-17: qty 1

## 2013-08-17 MED ORDER — FUROSEMIDE 40 MG PO TABS
40.0000 mg | ORAL_TABLET | Freq: Every day | ORAL | Status: DC
  Administered 2013-08-18: 40 mg via ORAL
  Filled 2013-08-17 (×2): qty 1

## 2013-08-17 MED ORDER — ZOLPIDEM TARTRATE 5 MG PO TABS: 5.0000 mg | ORAL_TABLET | Freq: Every evening | ORAL | Status: DC | PRN

## 2013-08-17 MED ORDER — STERILE WATER FOR IRRIGATION IR SOLN
Status: DC | PRN
  Administered 2013-08-17: 1500 mL

## 2013-08-17 MED ORDER — CEFAZOLIN SODIUM-DEXTROSE 2-3 GM-% IV SOLR
2.0000 g | INTRAVENOUS | Status: AC
  Administered 2013-08-17: 2 g via INTRAVENOUS

## 2013-08-17 MED ORDER — METHOCARBAMOL 500 MG PO TABS
500.0000 mg | ORAL_TABLET | Freq: Four times a day (QID) | ORAL | Status: DC | PRN
  Administered 2013-08-17: 500 mg via ORAL
  Filled 2013-08-17: qty 1

## 2013-08-17 MED ORDER — PROPOFOL 10 MG/ML IV BOLUS
INTRAVENOUS | Status: DC | PRN
  Administered 2013-08-17: 30 mg via INTRAVENOUS

## 2013-08-17 MED ORDER — POLYETHYLENE GLYCOL 3350 17 G PO PACK
17.0000 g | PACK | Freq: Two times a day (BID) | ORAL | Status: DC
  Administered 2013-08-18: 17 g via ORAL

## 2013-08-17 MED ORDER — SODIUM CHLORIDE 0.9 % IJ SOLN
INTRAMUSCULAR | Status: AC
  Filled 2013-08-17: qty 20

## 2013-08-17 MED ORDER — METFORMIN HCL 500 MG PO TABS
1000.0000 mg | ORAL_TABLET | Freq: Two times a day (BID) | ORAL | Status: DC
  Administered 2013-08-18: 1000 mg via ORAL
  Filled 2013-08-17 (×3): qty 2

## 2013-08-17 MED ORDER — CELECOXIB 200 MG PO CAPS
200.0000 mg | ORAL_CAPSULE | Freq: Two times a day (BID) | ORAL | Status: DC
  Administered 2013-08-17 – 2013-08-18 (×2): 200 mg via ORAL
  Filled 2013-08-17 (×3): qty 1

## 2013-08-17 MED ORDER — LIDOCAINE HCL 1 % IJ SOLN
INTRAMUSCULAR | Status: AC
  Filled 2013-08-17: qty 20

## 2013-08-17 MED ORDER — ONDANSETRON HCL 4 MG/2ML IJ SOLN: 4.0000 mg | Freq: Four times a day (QID) | INTRAMUSCULAR | Status: DC | PRN

## 2013-08-17 MED ORDER — AMLODIPINE BESYLATE 10 MG PO TABS
10.0000 mg | ORAL_TABLET | Freq: Every day | ORAL | Status: DC
  Administered 2013-08-18: 10 mg via ORAL
  Filled 2013-08-17 (×2): qty 1

## 2013-08-17 MED ORDER — GLIMEPIRIDE 4 MG PO TABS
4.0000 mg | ORAL_TABLET | Freq: Every day | ORAL | Status: DC
  Administered 2013-08-18: 4 mg via ORAL
  Filled 2013-08-17 (×2): qty 1

## 2013-08-17 MED ORDER — HYDROMORPHONE HCL PF 1 MG/ML IJ SOLN
0.5000 mg | INTRAMUSCULAR | Status: DC | PRN
  Administered 2013-08-17 (×3): 1 mg via INTRAVENOUS
  Filled 2013-08-17 (×3): qty 1

## 2013-08-17 MED ORDER — SODIUM CHLORIDE 0.9 % IR SOLN
Status: DC | PRN
  Administered 2013-08-17: 1000 mL

## 2013-08-17 MED ORDER — INSULIN ASPART 100 UNIT/ML ~~LOC~~ SOLN
0.0000 [IU] | Freq: Three times a day (TID) | SUBCUTANEOUS | Status: DC
  Administered 2013-08-17: 3 [IU] via SUBCUTANEOUS
  Administered 2013-08-18: 2 [IU] via SUBCUTANEOUS

## 2013-08-17 MED ORDER — ONDANSETRON HCL 4 MG PO TABS: 4.0000 mg | ORAL_TABLET | Freq: Four times a day (QID) | ORAL | Status: DC | PRN

## 2013-08-17 MED ORDER — KETOROLAC TROMETHAMINE 30 MG/ML IJ SOLN
INTRAMUSCULAR | Status: AC
  Filled 2013-08-17: qty 1

## 2013-08-17 MED ORDER — SIMVASTATIN 20 MG PO TABS
20.0000 mg | ORAL_TABLET | Freq: Every day | ORAL | Status: DC
Start: 2013-08-17 — End: 2013-08-18
  Administered 2013-08-17: 20 mg via ORAL
  Filled 2013-08-17 (×2): qty 1

## 2013-08-17 MED ORDER — BUPIVACAINE LIPOSOME 1.3 % IJ SUSP
20.0000 mL | Freq: Once | INTRAMUSCULAR | Status: DC
Start: 2013-08-17 — End: 2013-08-17
  Filled 2013-08-17: qty 20

## 2013-08-17 MED ORDER — SODIUM CHLORIDE 0.9 % IJ SOLN
INTRAMUSCULAR | Status: DC | PRN
  Administered 2013-08-17: 10:00:00

## 2013-08-17 MED ORDER — HYDROMORPHONE HCL PF 1 MG/ML IJ SOLN: 0.2500 mg | INTRAMUSCULAR | Status: DC | PRN

## 2013-08-17 MED ORDER — CHLORHEXIDINE GLUCONATE 4 % EX LIQD: 60.0000 mL | Freq: Once | CUTANEOUS | Status: DC

## 2013-08-17 MED ORDER — FERROUS SULFATE 325 (65 FE) MG PO TABS
325.0000 mg | ORAL_TABLET | Freq: Three times a day (TID) | ORAL | Status: DC
  Administered 2013-08-17 – 2013-08-18 (×2): 325 mg via ORAL
  Filled 2013-08-17 (×5): qty 1

## 2013-08-17 MED ORDER — SODIUM CHLORIDE 0.9 % IR SOLN
Status: DC | PRN
  Administered 2013-08-17: 4000 mL

## 2013-08-17 MED ORDER — ALUM & MAG HYDROXIDE-SIMETH 200-200-20 MG/5ML PO SUSP: 30.0000 mL | ORAL | Status: DC | PRN

## 2013-08-17 MED ORDER — MENTHOL 3 MG MT LOZG
1.0000 | LOZENGE | OROMUCOSAL | Status: DC | PRN
  Filled 2013-08-17: qty 9

## 2013-08-17 MED ORDER — TRANEXAMIC ACID 100 MG/ML IV SOLN
1000.0000 mg | Freq: Once | INTRAVENOUS | Status: AC
  Administered 2013-08-17: 1000 mg via INTRAVENOUS
  Filled 2013-08-17: qty 10

## 2013-08-17 MED ORDER — METOCLOPRAMIDE HCL 5 MG/ML IJ SOLN: 5.0000 mg | Freq: Three times a day (TID) | INTRAMUSCULAR | Status: DC | PRN

## 2013-08-17 MED ORDER — ASPIRIN EC 325 MG PO TBEC
325.0000 mg | DELAYED_RELEASE_TABLET | Freq: Two times a day (BID) | ORAL | Status: DC
  Administered 2013-08-18: 325 mg via ORAL
  Filled 2013-08-17 (×3): qty 1

## 2013-08-17 SURGICAL SUPPLY — 99 items
ADAPTER BOLT FEMORAL +2/-2 (Knees) ×2 IMPLANT
ADH SKN CLS APL DERMABOND .7 (GAUZE/BANDAGES/DRESSINGS) ×2
ADPR FEM +2/-2 OFST BOLT (Knees) ×1 IMPLANT
ADPR FEM 5D STRL KN PFC SGM (Orthopedic Implant) ×1 IMPLANT
AUG FEM SZ5 12 STRL LF KN LT (Knees) ×1 IMPLANT
AUG FEM SZ5 4 CMB POST STRL LF (Orthopedic Implant) ×2 IMPLANT
AUG FEM SZ5 4 STRL LF KN LT TI (Knees) ×1 IMPLANT
AUG TIB SZ5 10 REV STP WDG (Knees) ×1 IMPLANT
AUGMENT DISTAL LEFT 12 (Knees) IMPLANT
AUGMENT FMRL POST PFC 4MM SZ5 (Orthopedic Implant) IMPLANT
BAG SPEC THK2 15X12 ZIP CLS (MISCELLANEOUS) ×1
BAG ZIPLOCK 12X15 (MISCELLANEOUS) ×3 IMPLANT
BANDAGE ELASTIC 6 VELCRO ST LF (GAUZE/BANDAGES/DRESSINGS) ×3 IMPLANT
BANDAGE ESMARK 6X9 LF (GAUZE/BANDAGES/DRESSINGS) ×1 IMPLANT
BLADE SAW SGTL 13.0X1.19X90.0M (BLADE) ×3 IMPLANT
BLADE SAW SGTL 81X20 HD (BLADE) ×3 IMPLANT
BNDG CMPR 9X6 STRL LF SNTH (GAUZE/BANDAGES/DRESSINGS) ×1
BNDG ESMARK 6X9 LF (GAUZE/BANDAGES/DRESSINGS) ×3
BONE CEMENT GENTAMICIN (Cement) ×12 IMPLANT
BOWL SMART MIX CTS (DISPOSABLE) IMPLANT
BRUSH FEMORAL CANAL (MISCELLANEOUS) ×2 IMPLANT
CEMENT BONE GENTAMICIN 40 (Cement) ×3 IMPLANT
CEMENT RESTRICTOR DEPUY SZ 6 (Cement) ×2 IMPLANT
CUFF TOURN SGL QUICK 34 (TOURNIQUET CUFF) ×3
CUFF TRNQT CYL 34X4X40X1 (TOURNIQUET CUFF) ×1 IMPLANT
DERMABOND ADVANCED (GAUZE/BANDAGES/DRESSINGS) ×4
DERMABOND ADVANCED .7 DNX12 (GAUZE/BANDAGES/DRESSINGS) ×1 IMPLANT
DISTAL AUG 12 LEFT (Knees) ×3 IMPLANT
DRAPE EXTREMITY T 121X128X90 (DRAPE) ×3 IMPLANT
DRAPE POUCH INSTRU U-SHP 10X18 (DRAPES) ×3 IMPLANT
DRAPE U-SHAPE 47X51 STRL (DRAPES) ×3 IMPLANT
DRSG ADAPTIC 3X8 NADH LF (GAUZE/BANDAGES/DRESSINGS) IMPLANT
DRSG AQUACEL AG ADV 3.5X10 (GAUZE/BANDAGES/DRESSINGS) ×3 IMPLANT
DRSG AQUACEL AG ADV 3.5X14 (GAUZE/BANDAGES/DRESSINGS) ×2 IMPLANT
DRSG TEGADERM 4X4.75 (GAUZE/BANDAGES/DRESSINGS) IMPLANT
DURAPREP 26ML APPLICATOR (WOUND CARE) ×6 IMPLANT
ELECT REM PT RETURN 9FT ADLT (ELECTROSURGICAL) ×3
ELECTRODE REM PT RTRN 9FT ADLT (ELECTROSURGICAL) ×1 IMPLANT
EVACUATOR 1/8 PVC DRAIN (DRAIN) IMPLANT
FACESHIELD LNG OPTICON STERILE (SAFETY) ×17 IMPLANT
FEM POST AUG PFC 4MM SZ5 (Orthopedic Implant) ×6 IMPLANT
FEMORAL ADAPTER (Orthopedic Implant) ×2 IMPLANT
FEMUR LFT TC3 REVISION (Knees) ×2 IMPLANT
GAUZE SPONGE 2X2 8PLY STRL LF (GAUZE/BANDAGES/DRESSINGS) IMPLANT
GLOVE BIO SURGEON STRL SZ8 (GLOVE) ×4 IMPLANT
GLOVE BIOGEL PI IND STRL 7.0 (GLOVE) IMPLANT
GLOVE BIOGEL PI IND STRL 7.5 (GLOVE) ×1 IMPLANT
GLOVE BIOGEL PI IND STRL 8 (GLOVE) ×1 IMPLANT
GLOVE BIOGEL PI INDICATOR 7.0 (GLOVE) ×2
GLOVE BIOGEL PI INDICATOR 7.5 (GLOVE) ×4
GLOVE BIOGEL PI INDICATOR 8 (GLOVE) ×4
GLOVE ECLIPSE 8.0 STRL XLNG CF (GLOVE) ×3 IMPLANT
GLOVE ORTHO TXT STRL SZ7.5 (GLOVE) ×8 IMPLANT
GLOVE SURG SS PI 7.0 STRL IVOR (GLOVE) ×2 IMPLANT
GOWN SPEC L3 XXLG W/TWL (GOWN DISPOSABLE) ×3 IMPLANT
GOWN SRG XL XLNG 56XLVL 4 (GOWN DISPOSABLE) IMPLANT
GOWN STRL NON-REIN XL XLG LVL4 (GOWN DISPOSABLE) ×6
GOWN STRL REUS W/TWL LRG LVL3 (GOWN DISPOSABLE) ×5 IMPLANT
HANDPIECE INTERPULSE COAX TIP (DISPOSABLE) ×3
IMMOBILIZER KNEE 20 (SOFTGOODS) IMPLANT
INSERT TIB TC3 RP SZ5 15 (Knees) ×2 IMPLANT
KIT BASIN OR (CUSTOM PROCEDURE TRAY) ×3 IMPLANT
MANIFOLD NEPTUNE II (INSTRUMENTS) ×3 IMPLANT
NDL SAFETY ECLIPSE 18X1.5 (NEEDLE) ×1 IMPLANT
NEEDLE HYPO 18GX1.5 SHARP (NEEDLE) ×3
NS IRRIG 1000ML POUR BTL (IV SOLUTION) ×3 IMPLANT
PACK TOTAL JOINT (CUSTOM PROCEDURE TRAY) ×3 IMPLANT
PAD ABD 8X10 STRL (GAUZE/BANDAGES/DRESSINGS) IMPLANT
PADDING CAST COTTON 6X4 STRL (CAST SUPPLIES) IMPLANT
PATELLA DOME PFC 38MM (Knees) ×2 IMPLANT
POSITIONER SURGICAL ARM (MISCELLANEOUS) ×1 IMPLANT
RESTRICTOR CEMENT SZ 5 C-STEM (Cement) ×2 IMPLANT
SET HNDPC FAN SPRY TIP SCT (DISPOSABLE) ×1 IMPLANT
SET PAD KNEE POSITIONER (MISCELLANEOUS) ×3 IMPLANT
SLEEVE UNIV FEM DIST PRO SZ 31 (Sleeve) ×2 IMPLANT
SPONGE GAUZE 2X2 STER 10/PKG (GAUZE/BANDAGES/DRESSINGS)
SPONGE GAUZE 4X4 12PLY (GAUZE/BANDAGES/DRESSINGS) IMPLANT
SPONGE LAP 18X18 X RAY DECT (DISPOSABLE) ×4 IMPLANT
STAPLER VISISTAT 35W (STAPLE) IMPLANT
STEM TIBIA PFC 13X30MM (Stem) ×2 IMPLANT
STEM TIBIA PFC 13X60MM (Stem) ×2 IMPLANT
SUCTION FRAZIER 12FR DISP (SUCTIONS) ×3 IMPLANT
SUT MNCRL AB 3-0 PS2 18 (SUTURE) ×3 IMPLANT
SUT VIC AB 1 CT1 36 (SUTURE) ×5 IMPLANT
SUT VIC AB 2-0 CT1 27 (SUTURE) ×9
SUT VIC AB 2-0 CT1 TAPERPNT 27 (SUTURE) ×3 IMPLANT
SUT VLOC 180 0 24IN GS25 (SUTURE) ×3 IMPLANT
SYR 50ML LL SCALE MARK (SYRINGE) ×3 IMPLANT
TOWEL OR 17X26 10 PK STRL BLUE (TOWEL DISPOSABLE) ×3 IMPLANT
TOWER CARTRIDGE SMART MIX (DISPOSABLE) ×3 IMPLANT
TRAY FOLEY CATH 14FRSI W/METER (CATHETERS) ×1 IMPLANT
TRAY FOLEY METER SIL LF 16FR (CATHETERS) ×2 IMPLANT
TRAY SLEEVE CEM ML (Knees) ×2 IMPLANT
TRAY TIB SZ 5 REVISION (Knees) ×2 IMPLANT
WATER STERILE IRR 1500ML POUR (IV SOLUTION) ×3 IMPLANT
WEDGE LEFT SIZE 54MM (Knees) ×2 IMPLANT
WEDGE SZ 4 15MM (Knees) ×2 IMPLANT
WEDGE SZ 5 5MM 10MM (Knees) ×2 IMPLANT
WRAP KNEE MAXI GEL POST OP (GAUZE/BANDAGES/DRESSINGS) ×3 IMPLANT

## 2013-08-17 NOTE — Addendum Note (Signed)
Addendum created 08/17/13 1528 by Peyton Najjar, MD   Modules edited: Anesthesia Medication Administration

## 2013-08-17 NOTE — Transfer of Care (Signed)
Immediate Anesthesia Transfer of Care Note  Patient: Hunter Santos  Procedure(s) Performed: Procedure(s): REIMPLANTATION LEFT TOTAL KNEE ARTHROPLASTY WITH REMOVAL OF ANTIBIOTIC SPACER  (Left)  Patient Location: PACU  Anesthesia Type:Regional and Spinal  Level of Consciousness: awake, sedated and patient cooperative  Airway & Oxygen Therapy: Patient Spontanous Breathing and Patient connected to face mask oxygen  Post-op Assessment: Report given to PACU RN and Post -op Vital signs reviewed and stable  Post vital signs: Reviewed and stable  Complications: No apparent anesthesia complications

## 2013-08-17 NOTE — Brief Op Note (Signed)
08/17/2013  10:51 AM  PATIENT:  Hunter Santos  63 y.o. male  PRE-OPERATIVE DIAGNOSIS:  STATUS POST RESECTION LEFT TOTAL KNEE WITH ANTIBIOTIC SPACER   POST-OPERATIVE DIAGNOSIS:  STATUS POST RESECTION of infected LEFT TOTAL KNEE Arthroplasty   PROCEDURE:  Procedure(s): REIMPLANTATION LEFT TOTAL KNEE ARTHROPLASTY WITH REMOVAL OF ANTIBIOTIC SPACER  (Left)  SURGEON:  Surgeon(s) and Role:    * Mauri Pole, MD - Primary  PHYSICIAN ASSISTANT: Danae Orleans, PA-C  ANESTHESIA:   spinal  EBL:  Total I/O In: 3000 [I.V.:3000] Out: 950 [Urine:700; Blood:250]  BLOOD ADMINISTERED:none  DRAINS: none   LOCAL MEDICATIONS USED:  Exparel/Marcaine combination  SPECIMEN:  No Specimen  DISPOSITION OF SPECIMEN:  N/A  COUNTS:  YES  TOURNIQUET:   Total Tourniquet Time Documented: Thigh (Left) - 121 minutes Total: Thigh (Left) - 121 minutes   DICTATION: .Other Dictation: Dictation Number [id number]  PLAN OF CARE: Admit to inpatient   PATIENT DISPOSITION:  PACU - hemodynamically stable.   Delay start of Pharmacological VTE agent (>24hrs) due to surgical blood loss or risk of bleeding: no

## 2013-08-17 NOTE — Evaluation (Signed)
Physical Therapy Evaluation Patient Details Name: Hunter Santos MRN: [id number] DOB: [birth year] Today's Date: 08/17/2013 Time: 4098-1191 PT Time Calculation (min): 31 min  PT Assessment / Plan / Recommendation History of Present Illness  L tka reimplantation  Clinical Impression  Pt tolerated ambulation x 80' without a KI. Pt will benefit from PT to address problems listed to return to modified independence.    PT Assessment  Patient needs continued PT services    Follow Up Recommendations  Home health PT    Does the patient have the potential to tolerate intense rehabilitation      Barriers to Discharge        Equipment Recommendations  None recommended by PT    Recommendations for Other Services     Frequency 7X/week    Precautions / Restrictions Precautions Precautions: Fall;Knee   Pertinent Vitals/Pain Pain= 7 ice applied.     Mobility  Bed Mobility Overal bed mobility: Needs Assistance Bed Mobility: Supine to Sit;Sit to Supine Supine to sit: Min assist Sit to supine: Min assist General bed mobility comments: support of LLE off/onto bed. Transfers Overall transfer level: Needs assistance Equipment used: Rolling walker (2 wheeled) Transfers: Sit to/from Stand Sit to Stand: Min assist;From elevated surface General transfer comment: cues for safe use of RW Ambulation/Gait Ambulation/Gait assistance: Min assist Ambulation Distance (Feet): 80 Feet Assistive device: Rolling walker (2 wheeled) General Gait Details: cues for sequnce. posture    Exercises Total Joint Exercises Quad Sets: AROM;Left;10 reps Straight Leg Raises: AAROM;Left;5 reps;Supine   PT Diagnosis: Difficulty walking;Acute pain  PT Problem List: Decreased strength;Decreased range of motion;Decreased activity tolerance;Decreased mobility;Pain PT Treatment Interventions: DME instruction;Gait training;Stair training;Functional mobility training;Therapeutic activities;Therapeutic  exercise;Patient/family education     PT Goals(Current goals can be found in the care plan section) Acute Rehab PT Goals Patient Stated Goal: I want to walk  with my new knee PT Goal Formulation: With patient/family Time For Goal Achievement: 08/20/13 Potential to Achieve Goals: Good  Visit Information  Last PT Received On: 08/17/13 Assistance Needed: +1 History of Present Illness: L tka reimplantation       Prior Functioning  Home Living Family/patient expects to be discharged to:: Private residence Living Arrangements: Spouse/significant other Available Help at Discharge: Family;Available 24 hours/day Type of Home: House Home Access: Stairs to enter [employer] of Steps: 3 Entrance Stairs-Rails: Right Home Layout: Two level;Able to live on main level with bedroom/bathroom Home Equipment: Gilford Rile - 2 wheels;Tub bench;Shower seat;Bedside commode;Crutches;Cane - single point;Adaptive equipment Additional Comments: has reacher Prior Function Level of Independence: Independent with assistive device(s) Comments: RW/crutches Communication Communication: No difficulties    Cognition  Cognition Arousal/Alertness: Awake/alert Behavior During Therapy: WFL for tasks assessed/performed Overall Cognitive Status: Within Functional Limits for tasks assessed    Extremity/Trunk Assessment Upper Extremity Assessment Upper Extremity Assessment: Overall WFL for tasks assessed Lower Extremity Assessment Lower Extremity Assessment: LLE deficits/detail LLE Deficits / Details: able to lift leg from bed, knee flexed to 50 *   Balance    End of Session PT - End of Session Equipment Utilized During Treatment: Gait belt Activity Tolerance: Patient tolerated treatment well Patient left: in bed;with call bell/phone within reach;with family/visitor present Nurse Communication: Mobility status  GP     Claretha Cooper 08/17/2013, 5:43 PM Tresa Endo PT [phone]

## 2013-08-17 NOTE — Anesthesia Postprocedure Evaluation (Signed)
  Anesthesia Post-op Note  Patient: Hunter Santos  Procedure(s) Performed: Procedure(s) (LRB): REIMPLANTATION LEFT TOTAL KNEE ARTHROPLASTY WITH REMOVAL OF ANTIBIOTIC SPACER  (Left)  Patient Location: PACU  Anesthesia Type: Spinal  Level of Consciousness: awake and alert   Airway and Oxygen Therapy: Patient Spontanous Breathing  Post-op Pain: mild  Post-op Assessment: Post-op Vital signs reviewed, Patient's Cardiovascular Status Stable, Respiratory Function Stable, Patent Airway and No signs of Nausea or vomiting  Last Vitals:  Filed Vitals:   08/17/13 1210  BP: 103/70  Pulse: 84  Temp: 36.4 C  Resp: 16    Post-op Vital Signs: stable   Complications: No apparent anesthesia complications

## 2013-08-17 NOTE — Plan of Care (Signed)
Problem: Consults Goal: Diagnosis- Total Joint Replacement Outcome: Completed/Met Date Met:  08/17/13 Revision Total Knee Reimplantation of LTK

## 2013-08-17 NOTE — Plan of Care (Signed)
Problem: Consults Goal: Diagnosis- Total Joint Replacement Reimplantation of left totao knee with removal of abx spacer

## 2013-08-17 NOTE — Anesthesia Procedure Notes (Signed)
Spinal  Patient location during procedure: OR Start time: 08/17/2013 7:37 AM End time: 08/17/2013 7:44 AM Staffing Anesthesiologist: Rod Mae L Performed by: anesthesiologist  Preanesthetic Checklist Completed: patient identified, site marked, surgical consent, pre-op evaluation, timeout performed, IV checked, risks and benefits discussed and monitors and equipment checked Spinal Block Patient position: sitting Prep: Betadine Patient monitoring: heart rate, continuous pulse ox and blood pressure Approach: midline Location: L2-3 Injection technique: single-shot Needle Needle type: Spinocan  Needle gauge: 22 G Needle length: 9 cm Assessment Sensory level: T6 Additional Notes Expiration date of kit checked and confirmed. Patient tolerated procedure well, without complications.  Attempted to use 25 gauge whitacre but needle not long enough with introducer so had to use 22 gauge instead at same location.

## 2013-08-17 NOTE — Progress Notes (Signed)
Utilization review completed.  

## 2013-08-17 NOTE — Interval H&P Note (Signed)
History and Physical Interval Note:  08/17/2013 7:12 AM  Hunter Santos  has presented today for surgery, with the diagnosis of STATUS POST RESECTION LEFT TOTAL KNEE WITH ANTIBIOTIC SPACER   The various methods of treatment have been discussed with the patient and family. After consideration of risks, benefits and other options for treatment, the patient has consented to  Procedure(s): REIMPLANTATION LEFT TOTAL KNEE ARTHROPLASTY WITH REMOVAL OF ANTIBIOTIC SPACER  (Left) as a surgical intervention .  The patient's history has been reviewed, patient examined, no change in status, stable for surgery.  I have reviewed the patient's chart and labs.  Questions were answered to the patient's satisfaction.     Mauri Pole

## 2013-08-18 LAB — BASIC METABOLIC PANEL
BUN: 15 mg/dL (ref 6–23)
CO2: 25 meq/L (ref 19–32)
Calcium: 8.2 mg/dL — ABNORMAL LOW (ref 8.4–10.5)
Chloride: 102 mEq/L (ref 96–112)
Creatinine, Ser: 0.89 mg/dL (ref 0.50–1.35)
GFR calc Af Amer: >90 mL/min (ref >90)
GFR calc non Af Amer: 90 mL/min — ABNORMAL LOW (ref >90)
GLUCOSE: 155 mg/dL — AB (ref 70–99)
POTASSIUM: 4.8 meq/L (ref 3.7–5.3)
Sodium: 137 mEq/L (ref 137–147)

## 2013-08-18 LAB — CBC
HEMATOCRIT: 29.2 % — AB (ref 39.0–52.0)
HEMOGLOBIN: 9.5 g/dL — AB (ref 13.0–17.0)
MCH: 26.5 pg (ref 26.0–34.0)
MCHC: 32.5 g/dL (ref 30.0–36.0)
MCV: 81.6 fL (ref 78.0–100.0)
Platelets: 209 10*3/uL (ref 150–400)
RBC: 3.58 MIL/uL — AB (ref 4.22–5.81)
RDW: 15 % (ref 11.5–15.5)
WBC: 5.5 10*3/uL (ref 4.0–10.5)

## 2013-08-18 LAB — GLUCOSE, CAPILLARY: Glucose-Capillary: 138 mg/dL — ABNORMAL HIGH (ref 70–99)

## 2013-08-18 MED ORDER — HYDROCODONE-ACETAMINOPHEN 7.5-325 MG PO TABS: 1.0000 | ORAL_TABLET | ORAL | Status: DC | PRN

## 2013-08-18 MED ORDER — POLYETHYLENE GLYCOL 3350 17 G PO PACK: 17.0000 g | PACK | Freq: Two times a day (BID) | ORAL | Status: DC

## 2013-08-18 MED ORDER — ASPIRIN 325 MG PO TBEC: 325.0000 mg | DELAYED_RELEASE_TABLET | Freq: Two times a day (BID) | ORAL | Status: AC

## 2013-08-18 MED ORDER — TIZANIDINE HCL 4 MG PO CAPS: 4.0000 mg | ORAL_CAPSULE | Freq: Three times a day (TID) | ORAL | Status: DC | PRN

## 2013-08-18 MED ORDER — DSS 100 MG PO CAPS: 100.0000 mg | ORAL_CAPSULE | Freq: Two times a day (BID) | ORAL | Status: DC

## 2013-08-18 NOTE — Progress Notes (Signed)
08/18/2013 Mycal Conde BSN RN CCM [phone] CM  spoke with patient. Plans are for him to return to his home in [place] where spouse will be caregiver. He already has RW, commode seat, and crutches. Plans to use [facility] for [facility] services. [place] services will start day after pt is discharged.

## 2013-08-18 NOTE — Progress Notes (Signed)
OT Cancellation Note  Patient Details Name: JAMIERE GULAS MRN: [id number] DOB: 16-Sep-1950   Cancelled Treatment:    Reason Eval/Treat Not Completed: OT screened, no needs identified, will sign off. Pt states he has all DME from his knee spacer surgery and feels comfortable with all ADL as well. Has wife to assist. Will defer OT per pt request.  Jules Schick [phone] 08/18/2013, 10:56 AM

## 2013-08-18 NOTE — Op Note (Signed)
NAMEMarland Kitchen  SENECA, HOBACK NO.:  [id number]  MEDICAL RECORD NO.:  [id number]  LOCATION:  5409                         FACILITY:  [facility]  PHYSICIAN:  Pietro Cassis. Alvan Dame, M.D.  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  PROCEDURE: 1. Removal of antibiotic spacer. 2. Reimplantation/revision, left total knee arthroplasty.  COMPONENTS USED:  DePuy revision knee system with a size 5 TC3 femur, 31 press-fit sleeve, a 13 x 60 mm cemented stem, 4-mm distal medial augment, 12-mm distal lateral augment, 4-mm posterior, medial, and lateral augments.  The tibia side was a size 5 MBT revision tray with a 29 cemented sleeve, a 13 x 30 stem, a 15 mm size 4 medial wedge, a size 10 mm size 5 lateral wedge.  A size 15 mm TC3 insert was selected and a size 38 patellar button was utilized.  SURGEON:  Pietro Cassis. Alvan Dame, M.D.  ASSISTANT:  Danae Orleans, PA-C.  Note that Mr. Guinevere Scarlet was present for the entirety of the case from preoperative positioning, perioperative management of operative extremity, general facilitation of the case, and primary wound closure.  ANESTHESIA:  Spinal.  SPECIMENS:  None.  COMPLICATIONS:  None apparent.  BLOOD LOSS:  About 250 mL.  Tourniquet was utilized at 250 mmHg for 120 minutes.  DRAINS:  None.  INDICATION FOR PROCEDURE:  Mr. Bidinger is a very pleasant 63 year old male who had a knee replacement within a year ago.  He had done exceptionally well and presented to the office with increased pain about 2 months ago.  At the time, he was diagnosed with infection, he subsequently went to the operating room for  resection and placement of antibiotic spacer.  He did very well with this procedure as well and after 6 weeks of antibiotics, and a delay of at least 2 weeks off antibiotics, there were no signs of recurrent infection clinically, he was scheduled for reimplantation.  Risks of recurrence of infection, DVT, component failure, need for future visits were all discussed with this 63 year old male.  PROCEDURE IN DETAIL:  The patient was brought into the operative theater.  Once adequate anesthesia, appropriate antibiotics, Ancef 2 g administered, he was positioned supine with a left thigh tourniquet placed.  The left lower extremity was then prepped and draped in a sterile fashion.  A time-out was performed identifying the patient, planned procedure, and extremity.  Leg was exsanguinated, tourniquet elevated to 250 mmHg.  The patient's old incision was excised.  Soft tissue planes created, and a median arthrotomy was then made encountering no ceruminous fluid and no signs of infection.  Following initial exposure including medial and lateral synovectomies as well as in the suprapatellar region, care was taken not to avulse the tibial insertion of the patellar tendon.  With the knee exposed, I was able to remove the antibiotic spacers without difficulty.  Further debridement was permitted at this point,  and further debridement would be carried out throughout the case to prepare the distal femur proximal tibia with all exposed bone surfaces with no fibrous tissue over top of them.  Once the knee was exposed, I reamed up by hand to 15 mm width both in the distal femur and proximal tibia.  I first attended to the tibia evaluating bone loss and I used an extramedullary guide just to evaluate.  I determined I was going to have asymmetric wedges on the medial side versus the lateral side in order to produce a perpendicular plane in the coronal plane.  I had removed a previous size 5 femur and a  size 5 tibia, and thus selected a 5 tibial tray.  Once I was satisfied with the overall alignment of his tibial cut, we did note there was a 5 mm offset.  I broached for a 29 cemented sleeve.  At this point, I placed a trial insert in place with a 10 mm medial wedge, 5 mm lateral wedge, and a 29 cemented sleeve placed anteriorly.  At this point, I had the tibial tray aligned through the medial third of the tubercle with good perpendicular alignment.  This would thus be used to help rotation set for the femoral component.  At this point, I placed an intramedullary reamer into the femoral canal, and used a 14-mm sleeve to keep the aperture sleeve in order to maintain a good alignment of the stem.  I then revisited the distal cut identifying that I was going to need an offset of an 8 mm difference on the lateral side compared to the medial side for this left knee at 5 degrees of valgus.  I then revisited the cuts for a size 5 femoral component.  This was what was removed before with a +2 adapter.  I found that I was going to use 4 mm augments to help maintain the orientation of the patellofemoral compartment.  At this point, following revisiting the anterior, posterior, and chamfer cuts, I re-did the box cut for TC3 femur removing a little bit more bone off the medial aspect of the box from previous placed component.  At this point, I did a trial reduction with the size 5 TC3 femur with a 13 x 60 mm stem and the augments as noted, which was an 8-mm distal lateral augment, and 4-mm posterior medial augment.  With this in place and tibial tray in place, I found that even with a 15 mm insert, there was hyperextension noted.  Thus, multiple adjustments were made to the femoral side at this point. I removed the trial components, I decided to broach up to a size 31 cementless broach in order to provide enhanced stability due to the fact that the femoral component once placed seemed to want  to subside into more valgus as the bone stock on the lateral side was less available than medially.  I then prepared the distal femur by reaming and then broached to a 31 broach.  I also elected to increase the augmentation distally with a 4-mm medial augment and a 12-mm distal lateral augment.  Following preparation of all these components, we re-trialed and found that we were much more stable at a 15 mm extension.  I also determined I was going to increase the tibial wedges to 15 mm medial and 10 laterally.  At this point, all trial components were removed.  Cement restrictor was placed in the distal femur and proximal tibia.  We irrigated the  canal prior to this and debrided any fibrous material using a canal brush irrigator as well as pituitary rongeur.  Once the cement restrictor was placed, we irrigated the knee with 3 L of normal saline solution.  All the final components were opened after confirmation of dates and sizes, they were configured on the back table under direct supervision.  Once the final components were made, we mixed 4 batches of gentamicin- impregnated high viscosity cement.  The final components were then cemented into place as noted above.  The knee was brought to extension initially with a 12.5 insert.  We had prepared the patella previously removing approximately 1 mm to a bone and again all the scarring that I could identify in the supra and infrapatellar regions, a 38 button had been selected and was cemented into place.  With the knee in extension, excessive cement was removed. Once the cement had fully cured, I re-evaluated the extension and flexion and felt that the 15 mm insert would provide the most stability. I selected the TC3 insert due to the storing of this type of procedure, and to provide him some enhanced stability as this time to go along with this rotating platform design.  The final size 5 TC3 insert was opened and placed in the knee.   The knee was re-irrigated again.  Tourniquet had been let down after an hour and 20 minutes.  No significant hemostasis required, just punctate synovial bleeding as well as bone bleeding.  At this point, we reapproximated the extensor mechanism initially in extension and then at about 15 degrees of flexion using a combination of #1 Vicryl interrupted sutures, and then a 0 V-Loc suture for reinforcement.  The remainder of the wound was closed with 2-0 Vicryl and running 3-0 Monocryl.  The knee was cleaned, dried, and dressed sterilely using Dermabond and Aquacel dressing.  No drain was utilized.  He was brought to the recovery room.  Findings were reviewed with his wife.  We will anticipate 1-2 day hospital stay.  He will be weightbearing as tolerated, work on motion.     Pietro Cassis Alvan Dame, M.D.     MDO/MEDQ  D:  08/17/2013  T:  08/17/2013  Job:  [id number]

## 2013-08-18 NOTE — Progress Notes (Signed)
Physical Therapy Treatment Patient Details Name: Hunter Santos MRN: [id number] DOB: 12/01/1950 Today's Date: 08/18/2013 Time: 5400-8676 PT Time Calculation (min): 42 min  PT Assessment / Plan / Recommendation  History of Present Illness L tka reimplantation   PT Comments   Pt ambulated in hallway and took rest breaks as needed (also utilized rolling chair and had pt scoot forward and backward with assist for pushing chair to come/go from room to stairs).  Pt performed stairs and exercises as well.  Pt questions and concerns answered and pt feels ready for d/c home.   Follow Up Recommendations  Home health PT     Does the patient have the potential to tolerate intense rehabilitation     Barriers to Discharge        Equipment Recommendations  None recommended by PT    Recommendations for Other Services    Frequency 7X/week   Progress towards PT Goals Progress towards PT goals: Progressing toward goals  Plan Current plan remains appropriate    Precautions / Restrictions Precautions Precautions: Fall;Knee Restrictions Weight Bearing Restrictions: No Other Position/Activity Restrictions: WBAT   Pertinent Vitals/Pain C/o moderate pain during WBing (6-7/10) L knee, RN to bring pain meds end of session    Mobility  Bed Mobility Overal bed mobility: Needs Assistance Bed Mobility: Supine to Sit Supine to sit: Supervision General bed mobility comments: verbal cue for technique and self assist Transfers Overall transfer level: Needs assistance Equipment used: Rolling walker (2 wheeled) Transfers: Sit to/from Stand Sit to Stand: Min guard;Supervision General transfer comment: verbal cues for hand placement, performed a few times for rest breaks Ambulation/Gait Ambulation/Gait assistance: Min guard;Supervision Ambulation Distance (Feet): 200 Feet Assistive device: Rolling walker (2 wheeled) Gait Pattern/deviations: Step-to pattern;Decreased step length - left;Decreased  stance time - left;Antalgic General Gait Details: verbal cues for sequence initially then step length and RW distance Stairs: Yes Stairs assistance: Min guard Stair Management: One rail Right;With crutches;Forwards Number of Stairs: 4 General stair comments: verbal cues for sequence and technique, Rail on R and crutch on L ascending, increased time ascending required    Exercises Total Joint Exercises Ankle Circles/Pumps: AROM;Both;20 reps Quad Sets: AROM;10 reps;Both Short Arc Quad: AAROM;Left;10 reps Heel Slides: AAROM;Left;10 reps;Seated Hip ABduction/ADduction: AROM;Left;10 reps Straight Leg Raises: AAROM;Left;10 reps Goniometric ROM: knee flexion approx 60* sitting edge of chair   PT Diagnosis:    PT Problem List:   PT Treatment Interventions:     PT Goals (current goals can now be found in the care plan section)    Visit Information  Last PT Received On: 08/18/13 Assistance Needed: +1 History of Present Illness: L tka reimplantation    Subjective Data      Cognition  Cognition Arousal/Alertness: Awake/alert Behavior During Therapy: WFL for tasks assessed/performed Overall Cognitive Status: Within Functional Limits for tasks assessed    Balance     End of Session PT - End of Session Activity Tolerance: Patient tolerated treatment well Patient left: in chair;with call bell/phone within reach   GP     Hunter Santos,Hunter Santos 08/18/2013, 12:22 PM Hunter Santos, PT, DPT 08/18/2013 Pager: [phone]

## 2013-08-18 NOTE — Progress Notes (Signed)
Patient ID: Hunter Santos, male   DOB: Jan 30, 1951, 63 y.o.   MRN: [id number] Subjective: 1 Day Post-Op Procedure(s) (LRB): REIMPLANTATION LEFT TOTAL KNEE ARTHROPLASTY WITH REMOVAL OF ANTIBIOTIC SPACER  (Left)    Patient reports pain as mild to moderate. He is a very tough man and has tolerated his previous surgeries very well.  Objective:   VITALS:   Filed Vitals:   08/18/13 0800  BP:   Pulse:   Temp:   Resp: 18    Neurovascular intact Incision: dressing C/D/I Ice on knee this am  LABS  Recent Labs  08/18/13 0435  HGB 9.5*  HCT 29.2*  WBC 5.5  PLT 209     Recent Labs  08/18/13 0435  NA 137  K 4.8  BUN 15  CREATININE 0.89  GLUCOSE 155*    No results found for this basename: LABPT, INR,  in the last 72 hours   Assessment/Plan: 1 Day Post-Op Procedure(s) (LRB): REIMPLANTATION LEFT TOTAL KNEE ARTHROPLASTY WITH REMOVAL OF ANTIBIOTIC SPACER  (Left)   Advance diet Up with therapy Discharge home with home health when stable, could be as early as this pm

## 2013-08-18 NOTE — Progress Notes (Signed)
Pt to d/c home with [facility] home [facility]. No DME needs. AVS reviewed and "My Chart" discussed with pt. Pt capable of verbalizing medications, dressing changes, signs and symptoms of infection, and follow-up appointments. Remains hemodynamically stable. No signs and symptoms of distress. Educated pt to return to ER in the case of SOB, dizziness, or chest pain.  

## 2013-08-18 NOTE — Progress Notes (Signed)
[facility]  [facility] is providing the following services: patient declined rw and commode.  Has equipment at home.   If patient discharges after hours, please call [phone].   Linward Headland 08/18/2013, 11:42 AM

## 2013-08-19 NOTE — Discharge Summary (Signed)
Physician Discharge Summary  Patient ID: Hunter Santos MRN: [id number] DOB/AGE: 12/23/1950 63 y.o.  Admit date: 08/17/2013 Discharge date: 08/18/2013   Procedures:  Procedure(s) (LRB): REIMPLANTATION LEFT TOTAL KNEE ARTHROPLASTY WITH REMOVAL OF ANTIBIOTIC SPACER  (Left)  Attending Physician:  Dr. Paralee Cancel   Admission Diagnoses:   S/P infected left knee infection with resection of TKA and implantation of antibiotic spacer  Discharge Diagnoses:  Active Problems:   S/P revision of total knee  Past Medical History  Diagnosis Date  . Hypertension   . Diabetes mellitus   . Sleep apnea     had surgery for deviated septum previously  . Arthritis   . History of transfusion     HPI: Hunter Santos, 63 y.o. male, has a history of infection in the left total knee arthroplasty. Patient has undergone 6 weeks of IV antibiotics. The indications for the revision of the total knee arthroplasty are to reimplant a total knee arthroplasty after removal of antibiotic spacer. Prior procedures on the left knee(s) include arthroplasty. Patient currently rates pain in the left knee(s) at 4 out of 10 with activity. There is worsening of pain with activity and weight bearing, pain that interferes with activities of daily living, pain with passive range of motion, crepitus and joint swelling. This condition presents safety issues increasing the risk of falls. Risks, benefits and expectations were discussed with the patient. Risks including but not limited to the risk of anesthesia, blood clots, nerve damage, blood vessel damage, failure of the prosthesis, infection and up to and including death. Patient understand the risks, benefits and expectations and wishes to proceed with surgery.   PCP: Dwan Bolt, MD   Discharged Condition: good  Hospital Course:  Patient underwent the above stated procedure on 08/17/2013. Patient tolerated the procedure well and brought to the recovery room in good  condition and subsequently to the floor.  POD #1 BP: 130/86 ; Pulse: 89 ; Temp: 97.9 F (36.6 C) ; Resp: 18  Patient reports pain as mild to moderate. He is a very tough man and has tolerated his previous surgeries very well. Neurovascular intact, dorsiflexion/plantar flexion intact, incision: dressing C/D/I, no cellulitis present and compartment soft.   LABS  Basename    HGB  9.5  HCT  29.2    Discharge Exam: General appearance: alert, cooperative and no distress Extremities: Homans sign is negative, no sign of DVT, no edema, redness or tenderness in the calves or thighs and no ulcers, gangrene or trophic changes  Disposition:      Home-Health Care Svc with follow up in 2 weeks   Follow-up Information   Follow up with Mauri Pole, MD. Schedule an appointment as soon as possible for a visit in 2 weeks.   Specialty:  Orthopedic Surgery   Contact information:   [address] [place] [address] [phone]       Discharge Orders   Future Appointments Provider Department Dept Phone   09/14/2013 2:45 PM Truman Hayward, MD [facility] [phone]   Future Orders Complete By Expires   Call MD / Call 911  As directed    Comments:     If you experience chest pain or shortness of breath, CALL 911 and be transported to the hospital emergency room.  If you develope a fever above 101 F, pus (white drainage) or increased drainage or redness at the wound, or calf pain, call your surgeon's office.  Change dressing  As directed    Comments:     Maintain surgical dressing for 10-14 days, or until follow up in the clinic.   Constipation Prevention  As directed    Comments:     Drink plenty of fluids.  Prune juice may be helpful.  You may use a stool softener, such as Colace (over the counter) 100 mg twice a day.  Use MiraLax (over the counter) for constipation as needed.   Diet - low sodium heart healthy  As directed     Discharge instructions  As directed    Comments:     Maintain surgical dressing for 10-14 days, or until follow up in the clinic. Follow up in 2 weeks at [facility]. Call with any questions or concerns.   Increase activity slowly as tolerated  As directed    TED hose  As directed    Comments:     Use stockings (TED hose) for 2 weeks on both leg(s).  You may remove them at night for sleeping.   Weight bearing as tolerated  As directed    Questions:     Laterality:     Extremity:          Medication List    STOP taking these medications       ibuprofen 200 MG tablet  Commonly known as:  ADVIL,MOTRIN      TAKE these medications       amLODipine 10 MG tablet  Commonly known as:  NORVASC  Take 10 mg by mouth daily before breakfast.     aspirin 325 MG EC tablet  Take 1 tablet (325 mg total) by mouth 2 (two) times daily.     DSS 100 MG Caps  Take 100 mg by mouth 2 (two) times daily.     ferrous sulfate 325 (65 FE) MG tablet  Take 325 mg by mouth 2 (two) times daily with a meal.     furosemide 40 MG tablet  Commonly known as:  LASIX  Take 40 mg by mouth daily before breakfast.     glimepiride 4 MG tablet  Commonly known as:  AMARYL  Take 4 mg by mouth daily before breakfast.     HYDROcodone-acetaminophen 7.5-325 MG per tablet  Commonly known as:  NORCO  Take 1-2 tablets by mouth every 4 (four) hours as needed for moderate pain.     metFORMIN 1000 MG tablet  Commonly known as:  GLUCOPHAGE  Take 1,000 mg by mouth 2 (two) times daily with a meal.     polyethylene glycol packet  Commonly known as:  MIRALAX / GLYCOLAX  Take 17 g by mouth 2 (two) times daily.     pravastatin 40 MG tablet  Commonly known as:  PRAVACHOL  Take 40 mg by mouth every evening.     ramipril 10 MG tablet  Commonly known as:  ALTACE  Take 10 mg by mouth daily before breakfast.     tiZANidine 4 MG capsule  Commonly known as:  ZANAFLEX  Take 1 capsule (4 mg total) by mouth 3  (three) times daily as needed for muscle spasms.         Signed: West Pugh. Kaprice Kage   PAC  08/19/2013, 4:49 PM

## 2013-08-19 NOTE — Progress Notes (Signed)
Discharge summary sent to payer through [facility]  

## 2013-09-14 ENCOUNTER — Encounter: Payer: Self-pay | Admitting: Infectious Disease

## 2013-09-14 ENCOUNTER — Ambulatory Visit (INDEPENDENT_AMBULATORY_CARE_PROVIDER_SITE_OTHER): Payer: BC Managed Care – PPO | Admitting: Infectious Disease

## 2013-09-14 VITALS — BP 137/80 | HR 108 | Temp 98.1°F | Ht 73.0 in | Wt 270.0 lb

## 2013-09-14 DIAGNOSIS — B957 Other staphylococcus as the cause of diseases classified elsewhere: Secondary | ICD-10-CM

## 2013-09-14 DIAGNOSIS — T8450XA Infection and inflammatory reaction due to unspecified internal joint prosthesis, initial encounter: Secondary | ICD-10-CM

## 2013-09-14 DIAGNOSIS — Z96659 Presence of unspecified artificial knee joint: Secondary | ICD-10-CM

## 2013-09-14 DIAGNOSIS — T8459XA Infection and inflammatory reaction due to other internal joint prosthesis, initial encounter: Secondary | ICD-10-CM

## 2013-09-14 NOTE — Progress Notes (Signed)
Subjective:    Patient ID: Hunter Santos, male    DOB: 07-26-1950, 63 y.o.   MRN: [id number]  HPI   63 year old male with prosthetic joint infection left side sp removal of prosthetic material. He grew MS-COAG negative staph from OR cultures but allegedly grew MR-COAG negative staph from Dr. Aurea Graff office aspirate of knee.    I finally located the cultures from the knee and a culture obtained on 04/10/1999 4T did incur the to grow a methicillin-resistant coagulase-negative staph. The species was resistant to penicillin resistant to oxacillin sensitive to ciprofloxacin levofloxacin vancomycin clindamycin rifampin and tetracycline. Cell count from that aspirate revealed 35,790 white blood cells of 95% neutrophils.  The patient completed a six-week course of IV vancomycin. WE rechecked esr and crp and kept him off antibiotics. He was re-evaluated by Dr. Alvan Dame and underwent reimplantation of prosthetic knee on  08/17/13. He has done well postoperatively and is with minimal knee pain. Knee with effusion. No fevers, chills or malaise.    Review of Systems  Constitutional: Negative for fever, chills, diaphoresis, activity change, appetite change, fatigue and unexpected weight change.  HENT: Negative for congestion, rhinorrhea, sinus pressure, sneezing, sore throat and trouble swallowing.   Eyes: Negative for photophobia and visual disturbance.  Respiratory: Negative for cough, chest tightness, shortness of breath, wheezing and stridor.   Cardiovascular: Negative for chest pain, palpitations and leg swelling.  Gastrointestinal: Negative for nausea, vomiting, abdominal pain, diarrhea, constipation, blood in stool, abdominal distention and anal bleeding.  Genitourinary: Negative for dysuria, hematuria, flank pain and difficulty urinating.  Musculoskeletal: Positive for joint swelling. Negative for back pain, gait problem and myalgias.  Skin: Positive for wound. Negative for color change, pallor and  rash.  Neurological: Negative for dizziness, tremors, weakness and light-headedness.  Hematological: Negative for adenopathy. Does not bruise/bleed easily.  Psychiatric/Behavioral: Negative for behavioral problems, confusion, sleep disturbance, dysphoric mood, decreased concentration and agitation.       Objective:   Physical Exam  Constitutional: He is oriented to person, place, and time. He appears well-developed and well-nourished. No distress.  HENT:  Head: Normocephalic and atraumatic.  Mouth/Throat: Oropharynx is clear and moist. No oropharyngeal exudate.  Eyes: Conjunctivae and EOM are normal. Pupils are equal, round, and reactive to light. No scleral icterus.  Neck: Normal range of motion. Neck supple. No JVD present.  Cardiovascular: Normal rate, regular rhythm and normal heart sounds.  Exam reveals no gallop and no friction rub.   No murmur heard. Pulmonary/Chest: Effort normal and breath sounds normal. No respiratory distress. He has no wheezes. He has no rales. He exhibits no tenderness.  Abdominal: He exhibits no distension and no mass. There is no tenderness. There is no rebound and no guarding.  Musculoskeletal: He exhibits no edema and no tenderness.       Legs: Lymphadenopathy:    He has no cervical adenopathy.  Neurological: He is alert and oriented to person, place, and time. He exhibits normal muscle tone. Coordination normal.  Skin: Skin is warm and dry. He is not diaphoretic. No erythema. No pallor.  Psychiatric: He has a normal mood and affect. His behavior is normal. Judgment and thought content normal.    Left knee site:effusion, clean inscision, no erythema          Assessment & Plan:   Left prosthetic knee with MS coag is negative staph isolated in the OR with prior vs MR coag negative staph isolated Dr.  Aurea Graff office.  --sp  TWO staged replacement with IV abx vancomycin in intervening time period. No evidence of recurrence so far  --he can return  to clinic PRN if he develops worsening knee pain or other evidence for infection.   Marland Kitchen

## 2013-11-05 ENCOUNTER — Encounter: Payer: Self-pay | Admitting: Infectious Disease

## 2015-09-09 ENCOUNTER — Encounter (HOSPITAL_COMMUNITY): Payer: Self-pay | Admitting: *Deleted

## 2015-09-09 NOTE — H&P (Signed)
Hunter Santos is an 65 y.o. male.    Chief Complaint:    Infected left TKA  Procedure: I&D with poly exchange of the left TKA  HPI: Pt is a 65 y.o. male complaining of left knee pain since Monday 09/05/2015. 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  Aspiration in the office revealed infection and elevate CRP and sed rate.   X-rays in the clinic show previous left TKA.  Various options are discussed with the patient. Risks, benefits and expectations were discussed with the patient. Patient understand the risks, benefits and expectations and wishes to proceed with surgery.   PCP: Hunter Bolt, MD  D/C Plans:      Home with HHPT/SNF  Post-op Meds:       No Rx given  Tranexamic Acid:      To be given - IV   Decadron:      Is not to be given  FYI:     ASA  Norco  PICC line   PMH: Past Medical History  Diagnosis Date  . Hypertension   . Diabetes mellitus   . Arthritis   . History of transfusion   . Sleep apnea     had surgery for deviated septum previously    PSH: Past Surgical History  Procedure Laterality Date  . Tonsillectomy      as child  . Left ankle  2006  . Nasal septum surgery  2011  . Total knee arthroplasty  04/29/2012    Procedure: TOTAL KNEE ARTHROPLASTY;  Surgeon: Mauri Pole, MD;  Location: [facility];  Service: Orthopedics;  Laterality: Left;  . Excisional total knee arthroplasty Left 06/15/2013    Procedure: LEFT RESECTION TOTAL KNEE ARTHROPLASTY WITH PLACEMENT OF ANTIBIOTIC SPACERS;  Surgeon: Mauri Pole, MD;  Location: [facility];  Service: Orthopedics;  Laterality: Left;  . Total knee revision Left 08/17/2013    Procedure: REIMPLANTATION LEFT TOTAL KNEE ARTHROPLASTY WITH REMOVAL OF ANTIBIOTIC SPACER ;  Surgeon: Mauri Pole, MD;  Location: [facility];  Service: Orthopedics;  Laterality: Left;    Social History:  reports that he has never smoked. He has never used smokeless tobacco. He reports that he does not drink alcohol or use  illicit drugs.  Allergies:  No Known Allergies  Medications: No current facility-administered medications for this encounter.   Current Outpatient Prescriptions  Medication Sig Dispense Refill  . aspirin 81 MG tablet Take 81 mg by mouth daily.    . Dulaglutide (TRULICITY) 1.5 0000000 SOPN Inject into the skin. 1.5 mg once a week on Wednesdays    . empagliflozin (JARDIANCE) 25 MG TABS tablet Take 25 mg by mouth daily.    . Menthol, Topical Analgesic, (BLUE-EMU MAXIMUM STRENGTH EX) Apply topically. Patient uses on shoulders    . OVER THE COUNTER MEDICATION Goody powders prn    . amLODipine (NORVASC) 10 MG tablet Take 10 mg by mouth daily before breakfast.    . furosemide (LASIX) 40 MG tablet Take 40 mg by mouth daily before breakfast.    . glimepiride (AMARYL) 4 MG tablet Take 4 mg by mouth daily before breakfast.    . metFORMIN (GLUCOPHAGE) 1000 MG tablet Take 500 mg by mouth 2 (two) times daily with a meal.     . pravastatin (PRAVACHOL) 40 MG tablet Take 40 mg by mouth every evening.    . ramipril (ALTACE) 10 MG tablet Take 10 mg by mouth daily before breakfast.  Review of Systems  Constitutional: Negative.   HENT: Negative.   Eyes: Negative.   Respiratory: Negative.   Cardiovascular: Negative.   Gastrointestinal: Negative.   Genitourinary: Negative.   Musculoskeletal: Positive for joint pain.  Skin: Negative.   Neurological: Negative.   Endo/Heme/Allergies: Negative.   Psychiatric/Behavioral: Negative.       Physical Exam  Constitutional: He is oriented to person, place, and time. He appears well-developed.  HENT:  Head: Normocephalic.  Eyes: Pupils are equal, round, and reactive to light.  Neck: Neck supple. No JVD present. No tracheal deviation present. No thyromegaly present.  Cardiovascular: Normal rate, regular rhythm, normal heart sounds and intact distal pulses.   Respiratory: Effort normal and breath sounds normal. No stridor. No respiratory distress. He  has no wheezes.  GI: Soft. There is no tenderness. There is no guarding.  Musculoskeletal:       Left knee: He exhibits decreased range of motion, swelling and bony tenderness. He exhibits no ecchymosis, no deformity, no laceration and no erythema. Tenderness found.  Lymphadenopathy:    He has no cervical adenopathy.  Neurological: He is alert and oriented to person, place, and time.  Skin: Skin is warm and dry.  Psychiatric: He has a normal mood and affect.       Assessment/Plan Assessment:     Infected right TKA  Plan: Patient will undergo a I&D with poly exchange of the right TKA on 09/12/2015 per Dr. Alvan Dame at [facility]. Risks benefits and expectations were discussed with the patient. Patient understand risks, benefits and expectations and wishes to proceed.   West Pugh Santhiago Collingsworth   PA-C  09/09/2015, 1:40 PM

## 2015-09-11 ENCOUNTER — Inpatient Hospital Stay (HOSPITAL_COMMUNITY)
Admission: EM | Admit: 2015-09-11 | Discharge: 2015-09-17 | DRG: 485 | Disposition: A | Discharge status: Skilled Nursing Facility | Payer: 59 | Attending: Internal Medicine | Admitting: Internal Medicine

## 2015-09-11 ENCOUNTER — Encounter (HOSPITAL_COMMUNITY): Payer: Self-pay | Admitting: Emergency Medicine

## 2015-09-11 ENCOUNTER — Emergency Department (HOSPITAL_COMMUNITY): Payer: 59

## 2015-09-11 DIAGNOSIS — R509 Fever, unspecified: Secondary | ICD-10-CM

## 2015-09-11 DIAGNOSIS — I10 Essential (primary) hypertension: Secondary | ICD-10-CM | POA: Diagnosis present

## 2015-09-11 DIAGNOSIS — Z89529 Acquired absence of unspecified knee: Secondary | ICD-10-CM

## 2015-09-11 DIAGNOSIS — M25562 Pain in left knee: Secondary | ICD-10-CM | POA: Diagnosis present

## 2015-09-11 DIAGNOSIS — A419 Sepsis, unspecified organism: Secondary | ICD-10-CM | POA: Diagnosis not present

## 2015-09-11 DIAGNOSIS — E872 Acidosis, unspecified: Secondary | ICD-10-CM | POA: Diagnosis present

## 2015-09-11 DIAGNOSIS — B954 Other streptococcus as the cause of diseases classified elsewhere: Secondary | ICD-10-CM | POA: Diagnosis not present

## 2015-09-11 DIAGNOSIS — E0810 Diabetes mellitus due to underlying condition with ketoacidosis without coma: Secondary | ICD-10-CM

## 2015-09-11 DIAGNOSIS — Z7982 Long term (current) use of aspirin: Secondary | ICD-10-CM | POA: Diagnosis not present

## 2015-09-11 DIAGNOSIS — R6521 Severe sepsis with septic shock: Secondary | ICD-10-CM | POA: Diagnosis present

## 2015-09-11 DIAGNOSIS — G9340 Encephalopathy, unspecified: Secondary | ICD-10-CM | POA: Diagnosis present

## 2015-09-11 DIAGNOSIS — R17 Unspecified jaundice: Secondary | ICD-10-CM | POA: Diagnosis present

## 2015-09-11 DIAGNOSIS — M19012 Primary osteoarthritis, left shoulder: Secondary | ICD-10-CM | POA: Diagnosis present

## 2015-09-11 DIAGNOSIS — E119 Type 2 diabetes mellitus without complications: Secondary | ICD-10-CM

## 2015-09-11 DIAGNOSIS — Z794 Long term (current) use of insulin: Secondary | ICD-10-CM

## 2015-09-11 DIAGNOSIS — R6520 Severe sepsis without septic shock: Secondary | ICD-10-CM

## 2015-09-11 DIAGNOSIS — Y831 Surgical operation with implant of artificial internal device as the cause of abnormal reaction of the patient, or of later complication, without mention of misadventure at the time of the procedure: Secondary | ICD-10-CM | POA: Diagnosis present

## 2015-09-11 DIAGNOSIS — N179 Acute kidney failure, unspecified: Secondary | ICD-10-CM

## 2015-09-11 DIAGNOSIS — B955 Unspecified streptococcus as the cause of diseases classified elsewhere: Secondary | ICD-10-CM | POA: Diagnosis present

## 2015-09-11 DIAGNOSIS — A401 Sepsis due to streptococcus, group B: Secondary | ICD-10-CM | POA: Diagnosis present

## 2015-09-11 DIAGNOSIS — T8454XA Infection and inflammatory reaction due to internal left knee prosthesis, initial encounter: Secondary | ICD-10-CM | POA: Diagnosis present

## 2015-09-11 DIAGNOSIS — I514 Myocarditis, unspecified: Secondary | ICD-10-CM | POA: Diagnosis not present

## 2015-09-11 DIAGNOSIS — R7881 Bacteremia: Secondary | ICD-10-CM

## 2015-09-11 DIAGNOSIS — E8729 Other acidosis: Secondary | ICD-10-CM | POA: Diagnosis present

## 2015-09-11 DIAGNOSIS — E876 Hypokalemia: Secondary | ICD-10-CM | POA: Diagnosis present

## 2015-09-11 DIAGNOSIS — Y838 Other surgical procedures as the cause of abnormal reaction of the patient, or of later complication, without mention of misadventure at the time of the procedure: Secondary | ICD-10-CM | POA: Diagnosis not present

## 2015-09-11 DIAGNOSIS — B951 Streptococcus, group B, as the cause of diseases classified elsewhere: Secondary | ICD-10-CM | POA: Diagnosis not present

## 2015-09-11 DIAGNOSIS — Z96659 Presence of unspecified artificial knee joint: Secondary | ICD-10-CM

## 2015-09-11 DIAGNOSIS — R52 Pain, unspecified: Secondary | ICD-10-CM

## 2015-09-11 DIAGNOSIS — M00262 Other streptococcal arthritis, left knee: Secondary | ICD-10-CM | POA: Diagnosis not present

## 2015-09-11 HISTORY — DX: Sepsis, unspecified organism: A41.9

## 2015-09-11 HISTORY — DX: Severe sepsis with septic shock: R65.21

## 2015-09-11 HISTORY — DX: Acute kidney failure, unspecified: N17.9

## 2015-09-11 LAB — URINALYSIS, ROUTINE W REFLEX MICROSCOPIC
HGB URINE DIPSTICK: NEGATIVE
Ketones, ur: 40 mg/dL — AB
LEUKOCYTES UA: NEGATIVE
NITRITE: NEGATIVE
PROTEIN: 30 mg/dL — AB
Specific Gravity, Urine: 1.026 (ref 1.005–1.030)
pH: 5.5 (ref 5.0–8.0)

## 2015-09-11 LAB — COMPREHENSIVE METABOLIC PANEL
ALK PHOS: 87 U/L (ref 38–126)
ALT: 20 U/L (ref 17–63)
ANION GAP: 23 — AB (ref 5–15)
AST: 23 U/L (ref 15–41)
Albumin: 2.7 g/dL — ABNORMAL LOW (ref 3.5–5.0)
BILIRUBIN TOTAL: 2.4 mg/dL — AB (ref 0.3–1.2)
BUN: 30 mg/dL — ABNORMAL HIGH (ref 6–20)
CALCIUM: 9.2 mg/dL (ref 8.9–10.3)
CO2: 13 mmol/L — ABNORMAL LOW (ref 22–32)
Chloride: 97 mmol/L — ABNORMAL LOW (ref 101–111)
Creatinine, Ser: 1.29 mg/dL — ABNORMAL HIGH (ref 0.61–1.24)
GFR calc non Af Amer: 57 mL/min — ABNORMAL LOW (ref >60)
Glucose, Bld: 221 mg/dL — ABNORMAL HIGH (ref 65–99)
POTASSIUM: 2.7 mmol/L — AB (ref 3.5–5.1)
Sodium: 133 mmol/L — ABNORMAL LOW (ref 135–145)
TOTAL PROTEIN: 6.9 g/dL (ref 6.5–8.1)

## 2015-09-11 LAB — BASIC METABOLIC PANEL
ANION GAP: 14 (ref 5–15)
Anion gap: 12 (ref 5–15)
BUN: 28 mg/dL — ABNORMAL HIGH (ref 6–20)
BUN: 25 mg/dL — ABNORMAL HIGH (ref 6–20)
CHLORIDE: 107 mmol/L (ref 101–111)
CO2: 18 mmol/L — ABNORMAL LOW (ref 22–32)
CO2: 18 mmol/L — ABNORMAL LOW (ref 22–32)
Calcium: 8.1 mg/dL — ABNORMAL LOW (ref 8.9–10.3)
Calcium: 8.1 mg/dL — ABNORMAL LOW (ref 8.9–10.3)
Chloride: 103 mmol/L (ref 101–111)
Creatinine, Ser: 1.26 mg/dL — ABNORMAL HIGH (ref 0.61–1.24)
Creatinine, Ser: 1.06 mg/dL (ref 0.61–1.24)
GFR calc Af Amer: >60 mL/min (ref >60)
GFR calc non Af Amer: 59 mL/min — ABNORMAL LOW (ref >60)
GFR calc non Af Amer: >60 mL/min (ref >60)
Glucose, Bld: 142 mg/dL — ABNORMAL HIGH (ref 65–99)
Glucose, Bld: 118 mg/dL — ABNORMAL HIGH (ref 65–99)
POTASSIUM: 2.6 mmol/L — AB (ref 3.5–5.1)
Potassium: 2.1 mmol/L — CL (ref 3.5–5.1)
SODIUM: 139 mmol/L (ref 135–145)
Sodium: 133 mmol/L — ABNORMAL LOW (ref 135–145)

## 2015-09-11 LAB — BETA-HYDROXYBUTYRIC ACID: Beta-Hydroxybutyric Acid: 6.71 mmol/L — ABNORMAL HIGH (ref 0.05–0.27)

## 2015-09-11 LAB — LACTIC ACID, PLASMA
Lactic Acid, Venous: 2.2 mmol/L (ref 0.5–2.0)
Lactic Acid, Venous: 1.7 mmol/L (ref 0.5–2.0)

## 2015-09-11 LAB — I-STAT TROPONIN, ED: TROPONIN I, POC: 0.01 ng/mL (ref 0.00–0.08)

## 2015-09-11 LAB — CBC WITH DIFFERENTIAL/PLATELET
BASOS ABS: 0 10*3/uL (ref 0.0–0.1)
BASOS PCT: 0 %
Eosinophils Absolute: 0 10*3/uL (ref 0.0–0.7)
Eosinophils Relative: 0 %
HEMATOCRIT: 44.9 % (ref 39.0–52.0)
HEMOGLOBIN: 15.2 g/dL (ref 13.0–17.0)
LYMPHS PCT: 7 %
Lymphs Abs: 0.4 10*3/uL — ABNORMAL LOW (ref 0.7–4.0)
MCH: 29.5 pg (ref 26.0–34.0)
MCHC: 33.9 g/dL (ref 30.0–36.0)
MCV: 87.2 fL (ref 78.0–100.0)
Monocytes Absolute: 0.1 10*3/uL (ref 0.1–1.0)
Monocytes Relative: 2 %
NEUTROS ABS: 5.2 10*3/uL (ref 1.7–7.7)
NEUTROS PCT: 91 %
Platelets: 222 10*3/uL (ref 150–400)
RBC: 5.15 MIL/uL (ref 4.22–5.81)
RDW: 13.5 % (ref 11.5–15.5)
WBC: 5.7 10*3/uL (ref 4.0–10.5)

## 2015-09-11 LAB — PROTIME-INR
INR: 1.22 (ref 0.00–1.49)
INR: 1.32 (ref 0.00–1.49)
PROTHROMBIN TIME: 15.1 s (ref 11.6–15.2)
Prothrombin Time: 16 seconds — ABNORMAL HIGH (ref 11.6–15.2)

## 2015-09-11 LAB — BLOOD GAS, ARTERIAL
Acid-base deficit: 6.1 mmol/L — ABNORMAL HIGH (ref 0.0–2.0)
Bicarbonate: 16.2 mEq/L — ABNORMAL LOW (ref 20.0–24.0)
Drawn by: 257701
O2 Saturation: 90.7 %
Patient temperature: 98.6
TCO2: 14.5 mmol/L (ref 0–100)
pCO2 arterial: 24.7 mmHg — ABNORMAL LOW (ref 35.0–45.0)
pH, Arterial: 7.433 (ref 7.350–7.450)
pO2, Arterial: 61.2 mmHg — ABNORMAL LOW (ref 80.0–100.0)

## 2015-09-11 LAB — ACETAMINOPHEN LEVEL: Acetaminophen (Tylenol), Serum: <10 ug/mL — ABNORMAL LOW (ref 10–30)

## 2015-09-11 LAB — GLUCOSE, CAPILLARY
GLUCOSE-CAPILLARY: 132 mg/dL — AB (ref 65–99)
GLUCOSE-CAPILLARY: 141 mg/dL — AB (ref 65–99)

## 2015-09-11 LAB — CORTISOL: CORTISOL PLASMA: 63.6 ug/dL

## 2015-09-11 LAB — MRSA PCR SCREENING: MRSA by PCR: POSITIVE — AB

## 2015-09-11 LAB — URINE MICROSCOPIC-ADD ON: RBC / HPF: NONE SEEN RBC/hpf (ref 0–5)

## 2015-09-11 LAB — SALICYLATE LEVEL

## 2015-09-11 LAB — MAGNESIUM: MAGNESIUM: 1.7 mg/dL (ref 1.7–2.4)

## 2015-09-11 LAB — APTT: APTT: 30 s (ref 24–37)

## 2015-09-11 LAB — HEPATIC FUNCTION PANEL
ALBUMIN: 2.2 g/dL — AB (ref 3.5–5.0)
ALT: 35 U/L (ref 17–63)
AST: 78 U/L — AB (ref 15–41)
Alkaline Phosphatase: 70 U/L (ref 38–126)
BILIRUBIN DIRECT: 0.7 mg/dL — AB (ref 0.1–0.5)
BILIRUBIN TOTAL: 1.8 mg/dL — AB (ref 0.3–1.2)
Indirect Bilirubin: 1.1 mg/dL — ABNORMAL HIGH (ref 0.3–0.9)
Total Protein: 5.4 g/dL — ABNORMAL LOW (ref 6.5–8.1)

## 2015-09-11 LAB — I-STAT CG4 LACTIC ACID, ED
LACTIC ACID, VENOUS: 3.5 mmol/L — AB (ref 0.5–2.0)
LACTIC ACID, VENOUS: 2.77 mmol/L — AB (ref 0.5–2.0)

## 2015-09-11 LAB — PHOSPHORUS: PHOSPHORUS: 1 mg/dL — AB (ref 2.5–4.6)

## 2015-09-11 LAB — PROCALCITONIN: PROCALCITONIN: 38.56 ng/mL

## 2015-09-11 MED ORDER — SODIUM CHLORIDE 0.9 % IV BOLUS (SEPSIS)
2000.0000 mL | Freq: Once | INTRAVENOUS | Status: AC
Start: 2015-09-11 — End: 2015-09-11
  Administered 2015-09-11: 2000 mL via INTRAVENOUS

## 2015-09-11 MED ORDER — CHLORHEXIDINE GLUCONATE CLOTH 2 % EX PADS
6.0000 | MEDICATED_PAD | Freq: Every day | CUTANEOUS | Status: AC
  Administered 2015-09-12 – 2015-09-16 (×5): 6 via TOPICAL

## 2015-09-11 MED ORDER — ACETAMINOPHEN 325 MG PO TABS
650.0000 mg | ORAL_TABLET | Freq: Once | ORAL | Status: AC
  Administered 2015-09-11: 650 mg via ORAL
  Filled 2015-09-11: qty 2

## 2015-09-11 MED ORDER — SODIUM CHLORIDE 0.9% FLUSH
3.0000 mL | Freq: Two times a day (BID) | INTRAVENOUS | Status: DC
  Administered 2015-09-12 – 2015-09-16 (×4): 3 mL via INTRAVENOUS

## 2015-09-11 MED ORDER — ACETAMINOPHEN 650 MG RE SUPP: 650.0000 mg | Freq: Four times a day (QID) | RECTAL | Status: DC | PRN

## 2015-09-11 MED ORDER — SODIUM CHLORIDE 0.9 % IV BOLUS (SEPSIS)
2000.0000 mL | Freq: Once | INTRAVENOUS | Status: AC
  Administered 2015-09-11: 2000 mL via INTRAVENOUS

## 2015-09-11 MED ORDER — INSULIN ASPART 100 UNIT/ML ~~LOC~~ SOLN
0.0000 [IU] | SUBCUTANEOUS | Status: DC
  Administered 2015-09-11 (×2): 2 [IU] via SUBCUTANEOUS
  Administered 2015-09-12 – 2015-09-13 (×2): 3 [IU] via SUBCUTANEOUS
  Administered 2015-09-13 (×2): 2 [IU] via SUBCUTANEOUS
  Administered 2015-09-13 (×2): 3 [IU] via SUBCUTANEOUS
  Administered 2015-09-14: 2 [IU] via SUBCUTANEOUS
  Administered 2015-09-14 – 2015-09-15 (×6): 3 [IU] via SUBCUTANEOUS
  Administered 2015-09-15: 2 [IU] via SUBCUTANEOUS
  Administered 2015-09-15: 5 [IU] via SUBCUTANEOUS
  Administered 2015-09-15 (×2): 2 [IU] via SUBCUTANEOUS
  Administered 2015-09-16 (×3): 3 [IU] via SUBCUTANEOUS
  Administered 2015-09-16: 8 [IU] via SUBCUTANEOUS
  Administered 2015-09-16 – 2015-09-17 (×3): 2 [IU] via SUBCUTANEOUS
  Administered 2015-09-17: 3 [IU] via SUBCUTANEOUS
  Administered 2015-09-17: 5 [IU] via SUBCUTANEOUS

## 2015-09-11 MED ORDER — VANCOMYCIN HCL 10 G IV SOLR
1500.0000 mg | INTRAVENOUS | Status: DC
  Administered 2015-09-12: 1500 mg via INTRAVENOUS
  Filled 2015-09-11: qty 1500

## 2015-09-11 MED ORDER — MUPIROCIN 2 % EX OINT
TOPICAL_OINTMENT | CUTANEOUS | Status: AC
  Administered 2015-09-11: 1 via NASAL
  Filled 2015-09-11: qty 22

## 2015-09-11 MED ORDER — K PHOS MONO-SOD PHOS DI & MONO 155-852-130 MG PO TABS
250.0000 mg | ORAL_TABLET | Freq: Three times a day (TID) | ORAL | Status: DC
  Administered 2015-09-11 – 2015-09-17 (×16): 250 mg via ORAL
  Filled 2015-09-11 (×20): qty 1

## 2015-09-11 MED ORDER — MORPHINE SULFATE (PF) 2 MG/ML IV SOLN: 2.0000 mg | INTRAVENOUS | Status: DC | PRN

## 2015-09-11 MED ORDER — VANCOMYCIN HCL IN DEXTROSE 1-5 GM/200ML-% IV SOLN
1000.0000 mg | Freq: Once | INTRAVENOUS | Status: AC
  Administered 2015-09-11: 1000 mg via INTRAVENOUS
  Filled 2015-09-11: qty 200

## 2015-09-11 MED ORDER — POTASSIUM CHLORIDE IN NACL 20-0.9 MEQ/L-% IV SOLN
INTRAVENOUS | Status: DC
  Administered 2015-09-11: 125 mL/h via INTRAVENOUS
  Administered 2015-09-12: 08:00:00 via INTRAVENOUS
  Filled 2015-09-11 (×2): qty 1000

## 2015-09-11 MED ORDER — CETYLPYRIDINIUM CHLORIDE 0.05 % MT LIQD
7.0000 mL | Freq: Two times a day (BID) | OROMUCOSAL | Status: DC
  Administered 2015-09-11 – 2015-09-16 (×9): 7 mL via OROMUCOSAL
  Filled 2015-09-11: qty 7

## 2015-09-11 MED ORDER — POTASSIUM CHLORIDE 10 MEQ/100ML IV SOLN
10.0000 meq | INTRAVENOUS | Status: AC
  Administered 2015-09-11 – 2015-09-12 (×10): 10 meq via INTRAVENOUS
  Filled 2015-09-11 (×8): qty 100

## 2015-09-11 MED ORDER — PIPERACILLIN-TAZOBACTAM 3.375 G IVPB
3.3750 g | Freq: Three times a day (TID) | INTRAVENOUS | Status: DC
  Administered 2015-09-11 – 2015-09-12 (×3): 3.375 g via INTRAVENOUS
  Filled 2015-09-11 (×3): qty 50

## 2015-09-11 MED ORDER — POTASSIUM CHLORIDE 10 MEQ/100ML IV SOLN
10.0000 meq | Freq: Once | INTRAVENOUS | Status: AC
  Administered 2015-09-11: 10 meq via INTRAVENOUS
  Filled 2015-09-11: qty 100

## 2015-09-11 MED ORDER — ONDANSETRON HCL 4 MG/2ML IJ SOLN: 4.0000 mg | Freq: Four times a day (QID) | INTRAMUSCULAR | Status: DC | PRN

## 2015-09-11 MED ORDER — MUPIROCIN 2 % EX OINT
1.0000 "application " | TOPICAL_OINTMENT | Freq: Two times a day (BID) | CUTANEOUS | Status: AC
  Administered 2015-09-11 – 2015-09-16 (×10): 1 via NASAL
  Filled 2015-09-11: qty 22

## 2015-09-11 MED ORDER — PIPERACILLIN-TAZOBACTAM 3.375 G IVPB
3.3750 g | Freq: Once | INTRAVENOUS | Status: AC
  Administered 2015-09-11: 3.375 g via INTRAVENOUS
  Filled 2015-09-11: qty 50

## 2015-09-11 MED ORDER — SODIUM CHLORIDE 0.9 % IV SOLN
INTRAVENOUS | Status: DC
  Administered 2015-09-11: 125 mL/h via INTRAVENOUS

## 2015-09-11 MED ORDER — POTASSIUM CHLORIDE CRYS ER 20 MEQ PO TBCR
40.0000 meq | EXTENDED_RELEASE_TABLET | Freq: Once | ORAL | Status: AC
Start: 2015-09-11 — End: 2015-09-11
  Administered 2015-09-11: 40 meq via ORAL
  Filled 2015-09-11: qty 2

## 2015-09-11 MED ORDER — HEPARIN SODIUM (PORCINE) 5000 UNIT/ML IJ SOLN
5000.0000 [IU] | Freq: Three times a day (TID) | INTRAMUSCULAR | Status: DC
  Administered 2015-09-11 – 2015-09-12 (×3): 5000 [IU] via SUBCUTANEOUS
  Filled 2015-09-11 (×3): qty 1

## 2015-09-11 MED ORDER — SODIUM PHOSPHATE 3 MMOLE/ML IV SOLN
30.0000 mmol | Freq: Once | INTRAVENOUS | Status: AC
  Administered 2015-09-11: 30 mmol via INTRAVENOUS
  Filled 2015-09-11: qty 10

## 2015-09-11 MED ORDER — ACETAMINOPHEN 325 MG PO TABS
650.0000 mg | ORAL_TABLET | Freq: Four times a day (QID) | ORAL | Status: DC | PRN
  Administered 2015-09-11 – 2015-09-16 (×3): 650 mg via ORAL
  Filled 2015-09-11 (×3): qty 2

## 2015-09-11 MED ORDER — ONDANSETRON HCL 4 MG PO TABS: 4.0000 mg | ORAL_TABLET | Freq: Four times a day (QID) | ORAL | Status: DC | PRN

## 2015-09-11 MED ORDER — OXYCODONE HCL 5 MG PO TABS: 5.0000 mg | ORAL_TABLET | ORAL | Status: DC | PRN

## 2015-09-11 MED ORDER — POTASSIUM CHLORIDE CRYS ER 20 MEQ PO TBCR
40.0000 meq | EXTENDED_RELEASE_TABLET | Freq: Once | ORAL | Status: AC
  Administered 2015-09-11: 40 meq via ORAL
  Filled 2015-09-11: qty 2

## 2015-09-11 NOTE — Progress Notes (Signed)
CRITICAL VALUE ALERT  Critical value received: Phosphorus 1.0, Lactic Acid 2.2  Date of notification: 09/11/2015  Time of notification:  [id number]  Critical value read back:Yes.    Nurse who received alert:  Bobbye Riggs, RN   MD notified (1st page):  Bonnielee Haff, MD   Time of first page:  1845  MD notified (2nd page):  Time of second page:  Responding MD:  Bonnielee Haff, MD   Time MD responded:  [phone]

## 2015-09-11 NOTE — Progress Notes (Signed)
CRITICAL VALUE ALERT  Critical value received:  K+ 2.6  Date of notification:  09/11/2015  Time of notification:  2119 pm  Critical value read back: yes  Nurse who received alert:  Dyann Ruddle, RN  MD notified (1st page):  E Link  Time of first page:  2127pm  MD notified (2nd page):  Time of second page:  Responding MD:    Time MD responded:

## 2015-09-11 NOTE — ED Notes (Signed)
Bed: [id number] Expected date:  Expected time:  Means of arrival:  Comments: EMS- 64yo knee infection, possible sepsis

## 2015-09-11 NOTE — Consult Note (Signed)
PULMONARY / CRITICAL CARE MEDICINE   Name: Hunter Santos MRN: [id number] DOB: Jun 24, 1951    ADMISSION DATE:  09/11/2015 CONSULTATION DATE:  09/11/2015   REFERRING MD:  Dr Bonnielee Haff  CHIEF COMPLAINT:  Sepsis, lactic acidosis, ? SGLT2 inhbitiror metabolic acidosis and left knee abscess  HISTORY OF PRESENT ILLNESS:    65 year old male who is a type II diabetic on sodium glucose transporter 2 inhibitor and a history of infected left knee and status post total knee replacement. History obtained from him, his wife and talking to Dr. Maryland Pink and review of the records. According to Dr. Maryland Pink patient scraped his left knee 10 days ago and then developed left knee pain Monday, 09/05/2015. saw Dr. Ihor Gully orthopedics on 09/09/2015 and status post aspiration diagnosed to have infected left knee. Anticipating surgery Monday, 09/12/2015 but brought in by the wife and ambulance because the last few days he's been feeling poorly with gentle mellitus and low-grade fever but on the morning of 09/11/2015 had high-grade fever and chills. In the emergency department found to be febrile, tachycardic and tachypneic with lactic acid of 3.5 and a bicarbonate of 13 with a glucose less than 250 mg percent. In addition blood pressure readings have been soft although adequate except for transient hypotension with a map below 65. He is currently on third liter of saline with total for ordered. He is not intubated not on pressors he is conversant but felt to be mildly confused by Dr. Maryland Pink. Critical care medicine consulted. According to the emergency services physician Dr. Stark Jock orthopedics has been consulted with possible plan for surgery as previously scheduled tomorrow 09/12/2015    PAST MEDICAL HISTORY :  He  has a past medical history of Hypertension; Diabetes mellitus; Arthritis; History of transfusion; and Sleep apnea.  PAST SURGICAL HISTORY: He  has past surgical history that includes Tonsillectomy; left  ankle (2006); Nasal septum surgery (2011); Total knee arthroplasty (04/29/2012); Excisional total knee arthroplasty (Left, 06/15/2013); and Total knee revision (Left, 08/17/2013).  No Known Allergies  No current facility-administered medications on file prior to encounter.   Current Outpatient Prescriptions on File Prior to Encounter  Medication Sig  . amLODipine (NORVASC) 10 MG tablet Take 10 mg by mouth daily before breakfast.  . aspirin 81 MG tablet Take 81 mg by mouth daily.  . Dulaglutide (TRULICITY) 1.5 WN/4.6EV SOPN Inject into the skin. 1.5 mg once a week on Wednesdays  . empagliflozin (JARDIANCE) 25 MG TABS tablet Take 25 mg by mouth daily.  . furosemide (LASIX) 40 MG tablet Take 40 mg by mouth daily before breakfast.  . glimepiride (AMARYL) 4 MG tablet Take 4 mg by mouth daily before breakfast.  . Menthol, Topical Analgesic, (BLUE-EMU MAXIMUM STRENGTH EX) Apply 1 application topically 2 (two) times daily as needed (muscle strain). Patient uses on shoulders  . pravastatin (PRAVACHOL) 40 MG tablet Take 40 mg by mouth every evening.  . ramipril (ALTACE) 10 MG tablet Take 10 mg by mouth daily before breakfast.    FAMILY HISTORY:  His indicated that his mother is alive. He indicated that his father is deceased.   SOCIAL HISTORY: He  reports that he has never smoked. He has never used smokeless tobacco. He reports that he does not drink alcohol or use illicit drugs.  REVIEW OF SYSTEMS:   According to History of present illness   VITAL SIGNS: BP 93/59 mmHg  Pulse 110  Temp(Src) 102.4 F (39.1 C) (Axillary)  Resp 37  Wt 127.007 kg (280  lb)  SpO2 93%  HEMODYNAMICS:    VENTILATOR SETTINGS:    INTAKE / OUTPUT:    PHYSICAL EXAMINATION: GenerLying in bed in no distress NeuroOriented 3 but earlier was confused. Alert HEENTLooks dry in the mouth. Pupils equal and reactive no neck nodes CardiRegular rate and rhythm. Mildly tachycardic LungsNo distress. Clear to  auscultation bilaterally AbdomSoft nontender no organomegaly MuscuLeft knee is warm and mildly swollen and in wrap Skin:Appears intact overall  LABS:  PULMONARY No results for input(s): PHART, PCO2ART, PO2ART, HCO3, TCO2, O2SAT in the last 168 hours.  Invalid input(s): PCO2, PO2  CBC  Recent Labs Lab 09/11/15 1105  HGB 15.2  HCT 44.9  WBC 5.7  PLT 222    COAGULATION No results for input(s): INR in the last 168 hours.  CARDIAC  No results for input(s): TROPONINI in the last 168 hours. No results for input(s): PROBNP in the last 168 hours.   CHEMISTRY  Recent Labs Lab 09/11/15 1105  NA 133*  K 2.7*  CL 97*  CO2 13*  GLUCOSE 221*  BUN 30*  CREATININE 1.29*  CALCIUM 9.2   CrCl cannot be calculated (Unknown ideal weight.).   LIVER  Recent Labs Lab 09/11/15 1105  AST 23  ALT 20  ALKPHOS 87  BILITOT 2.4*  PROT 6.9  ALBUMIN 2.7*     INFECTIOUS  Recent Labs Lab 09/11/15 1114  LATICACIDVEN 3.50*     ENDOCRINE CBG (last 3)  No results for input(s): GLUCAP in the last 72 hours.       IMAGING x48h  - image(s) personally visualized  -   highlighted in bold Dg Chest Port 1 View  09/11/2015  CLINICAL DATA:  Acute fever and shortness of breath. EXAM: PORTABLE CHEST 1 VIEW COMPARISON:  10/20/2010 chest radiograph FINDINGS: The cardiomediastinal silhouette is unremarkable. There is no evidence of focal airspace disease, pulmonary edema, suspicious pulmonary nodule/mass, pleural effusion, or pneumothorax. No acute bony abnormalities are identified. IMPRESSION: No active disease. Electronically Signed   By: Margarette Canada M.D.   On: 09/11/2015 14:02      ASSESSMENT / PLAN:  PULMOMaintaining airway  P:    monitor  CARDIOVASCULAR A:  At risk for circulatory shock P:  Hydrate Mean arterial pressure goal greater than 65  RENAL A:   Lactic acidosis 3.5 probably due to sepsis but with severe low bicarbonate and glucose less than 250 mg -  implying metabolic acidosis secondary to as SGLT-2 inhibitor  - Status post 3 L fluid P:   - Await ABG  - 3 more liter IV fluid then normal saline at 1 25 mL per hour - Repeat lactic acid and be met check mag and phosphorus  GASTROINTESTINAL A:    Anticipation for surgery P:    Recommend nothing by mouth  HEMATOLOGIC A:    At risk for anemia of critical illness P:   monitor packed red blood cells for hemoglobin less than 7 g percent  INFECTIOUS A:    Septic left knee P:    Await bedside orthopedic consultation Vanc and Zosyn agree with that  ENDOCRINE A:    Type II diabetic -on SGLT2 inhibitor   P:   Sliding scale insulin  NEUROLOGIC A:    Appears appropriate but earlier apparently had mild acute encephalopathy  P:   Monitor   FAMILY  - Updates: Wife and patient updated in the bedside in the emergency department  - Inter-disciplinary family meet or Palliative Care meeting due by:  09/19/2015 which is day 7        The patient is critically ill with multiple organ systems failure and requires high complexity decision making for assessment and support, frequent evaluation and titration of therapies, application of advanced monitoring technologies and extensive interpretation of multiple databases.   Critical Care Time devoted to patient care services described in this note is  30  Minutes. This time reflects time of care of this signee Dr Brand Males. This critical care time does not reflect procedure time, or teaching time or supervisory time of PA/NP/Med student/Med Resident etc but could involve care discussion time    Dr. Brand Males, M.D., [facility].C.P Pulmonary and Critical Care Medicine Staff Physician [facility] Pager: [phone], If no answer or between  15:00h - 7:00h: call [phone]  09/11/2015 3:12 PM

## 2015-09-11 NOTE — H&P (Addendum)
[facility] History and Physical  MAURY GRONINGER NOI:[id number] DOB: 03-Jul-1951 DOA: 09/11/2015   PCP: Dwan Bolt, MD  Specialists: Dr. Alvan Dame is his orthopedic surgeon  Chief Complaint: Weakness  HPI: Hunter Santos is a 65 y.o. male with a past medical history of diabetes on multiple drugs, hypertension, previous history of left knee prosthesis infection who was in his usual state of health till about a week and a half ago when he was working outside his house. He felt weak and he was going up the stairs when he fell and scraped his knee. And then about a week ago he started developed pain in the left knee. The knee started swelling. He had difficulty walking. He subsequently went to see his orthopedic doctor on Thursday or Friday. Apparently, they performed x-rays of the knee. No fractures were noted. The knee was noted to be swollen. Arthrocentesis was done and apparently raised concern for infection. So the patient was scheduled for irrigation of that joint on Monday. However, this morning, patient felt extremely weak and he couldn't get up. He couldn't ambulate. And so EMS was called. Patient denies any nausea, vomiting, dizziness, lightheadedness. No passing out spells. No cough, shortness of breath. No difficulty with urination. No diarrhea. He appears to be distracted. His wife is at the bedside.  In the emergency department, patient was found to be tachycardic, hypotensive, tachypneic. Lactic acid is 3.5. Patient has high anion gap metabolic acidosis. He is hypokalemic. He appears to have severe sepsis with possible septic shock. He will need to be hospitalized for further management  Home Medications: Prior to Admission medications   Medication Sig Start Date End Date Taking? Authorizing Provider  amLODipine (NORVASC) 10 MG tablet Take 10 mg by mouth daily before breakfast.   Yes Historical Provider, MD  aspirin 81 MG tablet Take 81 mg by mouth daily.   Yes  Historical Provider, MD  Aspirin-Acetaminophen (GOODYS BODY PAIN PO) Take 1 packet by mouth every 6 (six) hours as needed (pain).   Yes Historical Provider, MD  Dulaglutide (TRULICITY) 1.5 QX/4.5WT SOPN Inject into the skin. 1.5 mg once a week on Wednesdays   Yes Historical Provider, MD  empagliflozin (JARDIANCE) 25 MG TABS tablet Take 25 mg by mouth daily.   Yes Historical Provider, MD  furosemide (LASIX) 40 MG tablet Take 40 mg by mouth daily before breakfast.   Yes Historical Provider, MD  glimepiride (AMARYL) 4 MG tablet Take 4 mg by mouth daily before breakfast.   Yes Historical Provider, MD  Menthol, Topical Analgesic, (BLUE-EMU MAXIMUM STRENGTH EX) Apply 1 application topically 2 (two) times daily as needed (muscle strain). Patient uses on shoulders   Yes Historical Provider, MD  metFORMIN (GLUCOPHAGE) 500 MG tablet Take 500-1,000 mg by mouth 2 (two) times daily with a meal. Take 102m in the morning, and then 5063mat night   Yes Historical Provider, MD  pravastatin (PRAVACHOL) 40 MG tablet Take 40 mg by mouth every evening.   Yes Historical Provider, MD  ramipril (ALTACE) 10 MG tablet Take 10 mg by mouth daily before breakfast.   Yes Historical Provider, MD    Allergies: No Known Allergies  Past Medical History: Past Medical History  Diagnosis Date  . Hypertension   . Diabetes mellitus   . Arthritis   . History of transfusion   . Sleep apnea     had surgery for deviated septum previously    Past Surgical History  Procedure Laterality Date  . Tonsillectomy  as child  . Left ankle  2006  . Nasal septum surgery  2011  . Total knee arthroplasty  04/29/2012    Procedure: TOTAL KNEE ARTHROPLASTY;  Surgeon: Hunter Pole, MD;  Location: [facility];  Service: Orthopedics;  Laterality: Left;  . Excisional total knee arthroplasty Left 06/15/2013    Procedure: LEFT RESECTION TOTAL KNEE ARTHROPLASTY WITH PLACEMENT OF ANTIBIOTIC SPACERS;  Surgeon: Hunter Pole, MD;  Location: [facility];   Service: Orthopedics;  Laterality: Left;  . Total knee revision Left 08/17/2013    Procedure: REIMPLANTATION LEFT TOTAL KNEE ARTHROPLASTY WITH REMOVAL OF ANTIBIOTIC SPACER ;  Surgeon: Hunter Pole, MD;  Location: [facility];  Service: Orthopedics;  Laterality: Left;    Social History: Patient lives in [place] with his wife. Denies any smoking, alcohol use or illicit drug use. Usually independent with daily activities.  Family History: denies any health problems in his family  Review of Systems - History obtained from the patient General ROS: positive for  - fatigue Psychological ROS: negative Ophthalmic ROS: negative ENT ROS: negative Allergy and Immunology ROS: negative Hematological and Lymphatic ROS: negative Endocrine ROS: negative Respiratory ROS: no cough, shortness of breath, or wheezing Cardiovascular ROS: no chest pain or dyspnea on exertion Gastrointestinal ROS: no abdominal pain, change in bowel habits, or black or bloody stools Genito-Urinary ROS: no dysuria, trouble voiding, or hematuria Musculoskeletal ROS: as in hpi Neurological ROS: no TIA or stroke symptoms Dermatological ROS: negative  Physical Examination  Filed Vitals:   09/11/15 1230 09/11/15 1300 09/11/15 1330 09/11/15 1400  BP: '94/59 92/60 93/61 ' 93/59  Pulse: 114 114 113 110  Temp:      TempSrc:      Resp: 34 37 27 37  Weight:      SpO2: 94% 91% 95% 93%    BP 93/59 mmHg  Pulse 110  Temp(Src) 102.4 F (39.1 C) (Axillary)  Resp 37  Wt 127.007 kg (280 lb)  SpO2 93%  General appearance: alert, cooperative, appears stated age, distracted and moderately obese Head: Normocephalic, without obvious abnormality, atraumatic Eyes: conjunctivae/corneas clear. PERRL, EOM's intact. Throat: lips, mucosa, and tongue normal; teeth and gums normal Neck: no adenopathy, no carotid bruit, no JVD, supple, symmetrical, trachea midline and thyroid not enlarged, symmetric, no tenderness/mass/nodules Resp: Patient is  noted to be tachypnea. Clear to auscultation bilaterally. Cardio: S1, S2 is tachycardic. Regular. No S3, S4. No rubs, murmurs, or bruit. No pedal edema. GI: soft, non-tender; bowel sounds normal; no masses,  no organomegaly Extremities: Left knee is noted to be swollen, warm to touch. There is some erythema of the left lower leg as well. Pulses are present. Limited range of motion of the left knee. Pulses: 2+ and symmetric Skin: Erythema of the left lower leg Lymph nodes: Cervical, supraclavicular, and axillary nodes normal. Neurologic: Awake, alert. Slightly distracted. No facial asymmetry. Motor strength is equal bilateral upper extremities. Patient has generalized weakness and unable to fully mobilize his lower extremities.  Laboratory Data: Results for orders placed or performed during the hospital encounter of 09/11/15 (from the past 48 hour(s))  Comprehensive metabolic panel     Status: Abnormal   Collection Time: 09/11/15 11:05 AM  Result Value Ref Range   Sodium 133 (L) 135 - 145 mmol/L   Potassium 2.7 (LL) 3.5 - 5.1 mmol/L    Comment: CRITICAL RESULT CALLED TO, READ BACK BY AND VERIFIED WITH: MCLAUGHLIN,G AT 1245 ON [id number] BY HOOKER,B    Chloride 97 (L) 101 - 111  mmol/L   CO2 13 (L) 22 - 32 mmol/L   Glucose, Bld 221 (H) 65 - 99 mg/dL   BUN 30 (H) 6 - 20 mg/dL   Creatinine, Ser 1.29 (H) 0.61 - 1.24 mg/dL   Calcium 9.2 8.9 - 10.3 mg/dL   Total Protein 6.9 6.5 - 8.1 g/dL   Albumin 2.7 (L) 3.5 - 5.0 g/dL   AST 23 15 - 41 U/L   ALT 20 17 - 63 U/L   Alkaline Phosphatase 87 38 - 126 U/L   Total Bilirubin 2.4 (H) 0.3 - 1.2 mg/dL   GFR calc non Af Amer 57 (L) >60 mL/min   GFR calc Af Amer >60 >60 mL/min    Comment: (NOTE) The eGFR has been calculated using the CKD EPI equation. This calculation has not been validated in all clinical situations. eGFR's persistently <60 mL/min signify possible Chronic Kidney Disease.    Anion gap 23 (H) 5 - 15    Comment: RESULT REPEATED AND  VERIFIED  CBC WITH DIFFERENTIAL     Status: Abnormal   Collection Time: 09/11/15 11:05 AM  Result Value Ref Range   WBC 5.7 4.0 - 10.5 K/uL   RBC 5.15 4.22 - 5.81 MIL/uL   Hemoglobin 15.2 13.0 - 17.0 g/dL   HCT 44.9 39.0 - 52.0 %   MCV 87.2 78.0 - 100.0 fL   MCH 29.5 26.0 - 34.0 pg   MCHC 33.9 30.0 - 36.0 g/dL   RDW 13.5 11.5 - 15.5 %   Platelets 222 150 - 400 K/uL   Neutrophils Relative % 91 %   Neutro Abs 5.2 1.7 - 7.7 K/uL   Lymphocytes Relative 7 %   Lymphs Abs 0.4 (L) 0.7 - 4.0 K/uL   Monocytes Relative 2 %   Monocytes Absolute 0.1 0.1 - 1.0 K/uL   Eosinophils Relative 0 %   Eosinophils Absolute 0.0 0.0 - 0.7 K/uL   Basophils Relative 0 %   Basophils Absolute 0.0 0.0 - 0.1 K/uL  I-Stat CG4 Lactic Acid, ED  (not at  [facility])     Status: Abnormal   Collection Time: 09/11/15 11:14 AM  Result Value Ref Range   Lactic Acid, Venous 3.50 (HH) 0.5 - 2.0 mmol/L   Comment NOTIFIED PHYSICIAN   I-stat troponin, ED     Status: None   Collection Time: 09/11/15 11:20 AM  Result Value Ref Range   Troponin i, poc 0.01 0.00 - 0.08 ng/mL   Comment 3            Comment: Due to the release kinetics of cTnI, a negative result within the first hours of the onset of symptoms does not rule out myocardial infarction with certainty. If myocardial infarction is still suspected, repeat the test at appropriate intervals.     Radiology Reports: Dg Chest Port 1 View  09/11/2015  CLINICAL DATA:  Acute fever and shortness of breath. EXAM: PORTABLE CHEST 1 VIEW COMPARISON:  10/20/2010 chest radiograph FINDINGS: The cardiomediastinal silhouette is unremarkable. There is no evidence of focal airspace disease, pulmonary edema, suspicious pulmonary nodule/mass, pleural effusion, or pneumothorax. No acute bony abnormalities are identified. IMPRESSION: No active disease. Electronically Signed   By: Margarette Canada M.D.   On: 09/11/2015 14:02    My interpretation of Electrocardiogram: Sinus tachycardia. Right  bundle branch block is noted. Heart rate is in the 140s.  Problem List  Principal Problem:   Septic shock ([place]) Active Problems:   DM (diabetes mellitus) ([facility])  Infection of prosthetic left knee joint ([facility])   High anion gap metabolic acidosis   Acute renal failure (ARF) (H[facility])   Hypokalemia   Assessment: This is a 65 year old Caucasian male with past medical history of diabetes, hypertension, previous episodes of left knee prosthesis infection presents with left knee pain, swelling, weakness and is found to have severe sepsis and possible septic shock. His presentation is most likely secondary to left knee infection. He also has some erythema in the left lower leg. He also has high anion gap metabolic acidosis.  Plan: #1 Severe sepsis, possible septic shock secondary to left knee infection: Patient will be admitted to the intensive care unit. Sepsis protocol has been initiated. He will receive 4 L of normal saline. Lactic acid level will be repeated. Check pro-calcitonin. PT INR. Discussed with Dr. Chase Caller, with critical care medicine. He will consult on this patient. ED physician has already discussed with orthopedics who will consult. Patient will be placed on vancomycin and Zosyn. Blood cultures have been obtained. No abnormalities noted on chest x-ray.  #2 High anion gap metabolic acidosis: Probably due to severe sepsis. However, patient is a diabetic and is noted to be on empagliflozin, which can cause ketoacidosis. He's also noted be a metformin, which can cause lacticacidemia. We will get a beta hydroxybutyrate level. UA will be checked for ketones. Basic metabolic panel will be repeated in the next 2-3 hours. If ketones are present and Beta hydroxybutyrate is elevated, it could be suggestive of diabetic ketoacidosis and he may benefit from being on an insulin infusion. Continue aggressive IV fluids. ABG is pending.  #3 history of diabetes mellitus type 2: As above. Mild hyperglycemia  is noted, but there is concern for ketoacidosis. All of his oral agents will be held. Depending on the results of the beta hydroxybutyrate and ketones in the urine we may have to initiate intravenous insulin. Check HbA1c.  #4 history of essential hypertension: Due to hypotension, we will hold all of his antihypertensive medications.  #5 Mild acute renal failure with hypokalemia: Aggressive IV fluids. Replete potassium.  ADDENDUM Beta hydroxybutyrate level was noted to be elevated. However, repeat the basic metabolic panel shows much improved metabolic acidosis. Anion gap has closed. So it is quite likely that all of this was secondary to sepsis and has improved with fluids. In view of this, we will hold off on intravenous insulin. Place the patient only on sliding scale insulin coverage for now. Potassium will be repleted. Labs will be repeated again tonight. He is also noted to have hyperbilirubinemia, mostly indirect bilirubin. Could be elevated due to sepsis. Continue to monitor for now.  DVT Prophylaxis: Subcutaneous heparin Code Status: Full code Family Communication: Discussed with the patient and his wife  Disposition Plan: Admit to ICU   Further management decisions will depend on results of further testing and patient's response to treatment.   [facility]  [facility] Pager [phone]  If 7PM-7AM, please contact night-coverage [email or web address] Password [email or web address]  09/11/2015, 2:08 PM

## 2015-09-11 NOTE — ED Notes (Signed)
Pt attempted to get urine sample unsuccessfully

## 2015-09-11 NOTE — ED Notes (Signed)
Pt from home. Pt was going to have knee surgery tomorrow to remove infection on L knee. Hx of L knee surgeryin 2014 and 2016. Pt had a fall on Friday and also a fall today. After fire dept picked up pt he began to develop chill. EMS got temp of 100.29F axillary. Pt tachycardic and tachpneic. L knee is swollen, no redness noted.

## 2015-09-11 NOTE — ED Notes (Signed)
MD at bedside. 

## 2015-09-11 NOTE — ED Provider Notes (Signed)
CSN: [id number]     Arrival date & time 09/11/15  1037 History   First MD Initiated Contact with Patient 09/11/15 1040     Chief Complaint  Patient presents with  . Code Sepsis  . Tachycardia     (Consider location/radiation/quality/duration/timing/severity/associated sxs/prior Treatment) HPI Comments: Patient is a 65 year old male with history of diabetes, hypertension, and osteoarthritis. He is status post left total knee arthroplasty last year. He has been having issues with this knee and is due for a revision of this arthroplasty tomorrow. Today he began with racing heart, fever, and not feeling well and presents for evaluation of this.  Patient is a 65 y.o. male presenting with fever.  Fever Max temp prior to arrival:  102 Severity:  Moderate Onset quality:  Sudden Timing:  Constant Progression:  Unchanged Chronicity:  New Relieved by:  Nothing Worsened by:  Nothing tried Associated symptoms: no chest pain, no chills, no cough and no dysuria     Past Medical History  Diagnosis Date  . Hypertension   . Diabetes mellitus   . Arthritis   . History of transfusion   . Sleep apnea     had surgery for deviated septum previously   Past Surgical History  Procedure Laterality Date  . Tonsillectomy      as child  . Left ankle  2006  . Nasal septum surgery  2011  . Total knee arthroplasty  04/29/2012    Procedure: TOTAL KNEE ARTHROPLASTY;  Surgeon: Mauri Pole, MD;  Location: [facility];  Service: Orthopedics;  Laterality: Left;  . Excisional total knee arthroplasty Left 06/15/2013    Procedure: LEFT RESECTION TOTAL KNEE ARTHROPLASTY WITH PLACEMENT OF ANTIBIOTIC SPACERS;  Surgeon: Mauri Pole, MD;  Location: [facility];  Service: Orthopedics;  Laterality: Left;  . Total knee revision Left 08/17/2013    Procedure: REIMPLANTATION LEFT TOTAL KNEE ARTHROPLASTY WITH REMOVAL OF ANTIBIOTIC SPACER ;  Surgeon: Mauri Pole, MD;  Location: [facility];  Service: Orthopedics;  Laterality: Left;    No family history on file. Social History  Substance Use Topics  . Smoking status: Never Smoker   . Smokeless tobacco: Never Used  . Alcohol Use: No    Review of Systems  Constitutional: Positive for fever. Negative for chills.  Respiratory: Negative for cough.   Cardiovascular: Negative for chest pain.  Genitourinary: Negative for dysuria.  All other systems reviewed and are negative.     Allergies  Review of patient's allergies indicates no known allergies.  Home Medications   Prior to Admission medications   Medication Sig Start Date End Date Taking? Authorizing Provider  amLODipine (NORVASC) 10 MG tablet Take 10 mg by mouth daily before breakfast.    Historical Provider, MD  aspirin 81 MG tablet Take 81 mg by mouth daily.    Historical Provider, MD  Dulaglutide (TRULICITY) 1.5 0000000 SOPN Inject into the skin. 1.5 mg once a week on Wednesdays    Historical Provider, MD  empagliflozin (JARDIANCE) 25 MG TABS tablet Take 25 mg by mouth daily.    Historical Provider, MD  furosemide (LASIX) 40 MG tablet Take 40 mg by mouth daily before breakfast.    Historical Provider, MD  glimepiride (AMARYL) 4 MG tablet Take 4 mg by mouth daily before breakfast.    Historical Provider, MD  Menthol, Topical Analgesic, (BLUE-EMU MAXIMUM STRENGTH EX) Apply topically. Patient uses on shoulders    Historical Provider, MD  metFORMIN (GLUCOPHAGE) 1000 MG tablet Take 500 mg by mouth  2 (two) times daily with a meal.     Historical Provider, MD  [place] powders prn    Historical Provider, MD  pravastatin (PRAVACHOL) 40 MG tablet Take 40 mg by mouth every evening.    Historical Provider, MD  ramipril (ALTACE) 10 MG tablet Take 10 mg by mouth daily before breakfast.    Historical Provider, MD   BP 96/71 mmHg  Pulse 145  Resp 35  Wt 280 lb (127.007 kg)  SpO2 95% Physical Exam  Constitutional: He is oriented to person, place, and time. He appears well-developed and  well-nourished. No distress.  HENT:  Head: Normocephalic and atraumatic.  Neck: Normal range of motion. Neck supple.  Cardiovascular: Normal rate.   No murmur heard. Pulmonary/Chest: Effort normal and breath sounds normal. No respiratory distress. He has no wheezes. He has no rales.  Abdominal: Soft. Bowel sounds are normal. He exhibits no distension. There is no tenderness.  Musculoskeletal: Normal range of motion.  The left knee is warm to the touch, however there is no erythema. There is a palpable effusion.  Neurological: He is alert and oriented to person, place, and time.  Skin: Skin is warm and dry. He is not diaphoretic.  Nursing note and vitals reviewed.   ED Course  Procedures (including critical care time) Labs Review Labs Reviewed  CULTURE, BLOOD (ROUTINE X 2)  CULTURE, BLOOD (ROUTINE X 2)  URINE CULTURE  COMPREHENSIVE METABOLIC PANEL  CBC WITH DIFFERENTIAL/PLATELET  URINALYSIS, ROUTINE W REFLEX MICROSCOPIC (NOT AT [facility])  I-STAT CG4 LACTIC ACID, ED    Imaging Review No results found. I have personally reviewed and evaluated these images and lab results as part of my medical decision-making.   EKG Interpretation   Date/Time:  Sunday September 11 2015 10:47:54 EST Ventricular Rate:  147 PR Interval:  120 QRS Duration: 134 QT Interval:  382 QTC Calculation: 597 R Axis:   -93 Text Interpretation:  Sinus tachycardia Right bundle branch block Inferior  infarct, old Probable posterior infarct, acute Lateral leads are also  involved Confirmed by Kaelin Holford  MD, Kristof Nadeem ([id number]) on 09/11/2015 12:45:32 PM      MDM   Final diagnoses:  None    Patient is a 65 year old male with history of diabetes and hypertension. He presents for evaluation of fever and tachycardia. This started yesterday. He is awaiting surgery to revise a total knee replacement which is apparently become infected. His presentation is concerning for a septic condition and a code sepsis and sepsis orders  were placed. The appropriate cultures were obtained and a spectrum antibiotics administered in a timely fashion.Marland Kitchen His laboratory studies reveal no elevation of white count, however he does have a metabolic acidosis. His lactate is elevated.  I've discussed this case with Dr. [place] Pink from the hospitalist service who will evaluate the patient. On his assessment, the patient requires a higher level of care and will consult critical care.  CRITICAL CARE Performed by: Veryl Speak Total critical care time: 60 minutes Critical care time was exclusive of separately billable procedures and treating other patients. Critical care was necessary to treat or prevent imminent or life-threatening deterioration. Critical care was time spent personally by me on the following activities: development of treatment plan with patient and/or surrogate as well as nursing, discussions with consultants, evaluation of patient's response to treatment, examination of patient, obtaining history from patient or surrogate, ordering and performing treatments and interventions, ordering and review of laboratory studies, ordering and review of radiographic  studies, pulse oximetry and re-evaluation of patient's condition.     Veryl Speak, MD 09/11/15 [phone]

## 2015-09-11 NOTE — Progress Notes (Signed)
[facility] Progress Note Patient Name: Hunter Santos DOB: [birth year] MRN: [id number]   Date of Service  09/11/2015  HPI/Events of Note  Low K/Phos  eICU Interventions  Replaced     Intervention Category Minor Interventions: Electrolytes abnormality - evaluation and management  Dimas Chyle 09/11/2015, 5:15 PM

## 2015-09-11 NOTE — Progress Notes (Signed)
Pharmacy Antibiotic Note  Hunter Santos WAS is a 65 y.o. male admitted on 09/11/2015 with sepsis.  Pharmacy has been consulted for vancomycin and zosyn dosing. Pt received initial doses in the ED  Plan: Vancomycin 1500mg  IV q24h Zosyn 3.375g IV Q8H infused over 4hrs. Follow renal function, cultures, clinical course vanc trough at steady state as indicated  Scr 1.26, CrCl ~ 59 (N)  Weight: 280 lb (127.007 kg)  Temp (24hrs), Avg:100 F (37.8 C), Min:97.6 F (36.4 C), Max:102.4 F (39.1 C)   Recent Labs Lab 09/11/15 1105 09/11/15 1114 09/11/15 1430 09/11/15 1450  WBC 5.7  --   --   --   CREATININE 1.29*  --  1.26*  --   LATICACIDVEN  --  3.50*  --  2.77*    CrCl cannot be calculated (Unknown ideal weight.).    No Known Allergies  Antimicrobials this admission: 2/26 vanc >> 2/26 zosyn >>  Dose adjustments this admission:   Microbiology results: 2/26 BCx:  2/26 UCx:   Sputum:  2/26 MRSA PCR:   Thank you for allowing pharmacy to be a part of this patient's care.  Dolly Rias RPh 09/11/2015, 4:59 PM Pager [phone]

## 2015-09-11 NOTE — Progress Notes (Signed)
Based on ABG results= Po2 61.2 (Sp02 90.7)- RT placed PT on 2 lpm [place].

## 2015-09-12 ENCOUNTER — Inpatient Hospital Stay (HOSPITAL_COMMUNITY): Admission: RE | Admit: 2015-09-12 | Payer: 59 | Source: Ambulatory Visit | Admitting: Orthopedic Surgery

## 2015-09-12 ENCOUNTER — Inpatient Hospital Stay (HOSPITAL_COMMUNITY): Payer: 59 | Admitting: Certified Registered"

## 2015-09-12 ENCOUNTER — Encounter (HOSPITAL_COMMUNITY): Payer: Self-pay

## 2015-09-12 ENCOUNTER — Encounter (HOSPITAL_COMMUNITY)
Admission: EM | Disposition: A | Discharge status: Skilled Nursing Facility | Payer: Self-pay | Source: Home / Self Care | Attending: Internal Medicine

## 2015-09-12 DIAGNOSIS — R7881 Bacteremia: Secondary | ICD-10-CM

## 2015-09-12 DIAGNOSIS — B955 Unspecified streptococcus as the cause of diseases classified elsewhere: Secondary | ICD-10-CM

## 2015-09-12 HISTORY — DX: Bacteremia: R78.81

## 2015-09-12 HISTORY — DX: Unspecified streptococcus as the cause of diseases classified elsewhere: B95.5

## 2015-09-12 HISTORY — PX: I & D KNEE WITH POLY EXCHANGE: SHX5024

## 2015-09-12 LAB — GLUCOSE, CAPILLARY
GLUCOSE-CAPILLARY: 103 mg/dL — AB (ref 65–99)
GLUCOSE-CAPILLARY: 155 mg/dL — AB (ref 65–99)
GLUCOSE-CAPILLARY: 78 mg/dL (ref 65–99)
GLUCOSE-CAPILLARY: 86 mg/dL (ref 65–99)
GLUCOSE-CAPILLARY: 88 mg/dL (ref 65–99)
Glucose-Capillary: 134 mg/dL — ABNORMAL HIGH (ref 65–99)

## 2015-09-12 LAB — CBC WITH DIFFERENTIAL/PLATELET
BASOS PCT: 0 %
Basophils Absolute: 0 10*3/uL (ref 0.0–0.1)
EOS PCT: 0 %
Eosinophils Absolute: 0 10*3/uL (ref 0.0–0.7)
HEMATOCRIT: 35.8 % — AB (ref 39.0–52.0)
HEMOGLOBIN: 12 g/dL — AB (ref 13.0–17.0)
LYMPHS ABS: 1 10*3/uL (ref 0.7–4.0)
Lymphocytes Relative: 7 %
MCH: 29.3 pg (ref 26.0–34.0)
MCHC: 33.5 g/dL (ref 30.0–36.0)
MCV: 87.3 fL (ref 78.0–100.0)
MONOS PCT: 7 %
Monocytes Absolute: 1 10*3/uL (ref 0.1–1.0)
NEUTROS ABS: 11.8 10*3/uL — AB (ref 1.7–7.7)
Neutrophils Relative %: 86 %
Platelets: 192 10*3/uL (ref 150–400)
RBC: 4.1 MIL/uL — ABNORMAL LOW (ref 4.22–5.81)
RDW: 13.9 % (ref 11.5–15.5)
WBC: 13.8 10*3/uL — ABNORMAL HIGH (ref 4.0–10.5)

## 2015-09-12 LAB — SYNOVIAL CELL COUNT + DIFF, W/ CRYSTALS
Crystals, Fluid: NONE SEEN
Lymphocytes-Synovial Fld: 95 % — ABNORMAL HIGH (ref 0–20)
Neutrophil, Synovial: 5 % (ref 0–25)
WBC, Synovial: 367920 /mm3 — ABNORMAL HIGH (ref 0–200)

## 2015-09-12 LAB — LACTIC ACID, PLASMA
LACTIC ACID, VENOUS: 1.1 mmol/L (ref 0.5–2.0)
LACTIC ACID, VENOUS: 1.1 mmol/L (ref 0.5–2.0)
Lactic Acid, Venous: 1.1 mmol/L (ref 0.5–2.0)

## 2015-09-12 LAB — POCT I-STAT 4, (NA,K, GLUC, HGB,HCT)
GLUCOSE: 121 mg/dL — AB (ref 65–99)
HEMATOCRIT: 39 % (ref 39.0–52.0)
HEMOGLOBIN: 13.3 g/dL (ref 13.0–17.0)
POTASSIUM: 3.1 mmol/L — AB (ref 3.5–5.1)
Sodium: 140 mmol/L (ref 135–145)

## 2015-09-12 LAB — BASIC METABOLIC PANEL
Anion gap: 12 (ref 5–15)
BUN: 23 mg/dL — ABNORMAL HIGH (ref 6–20)
CO2: 19 mmol/L — ABNORMAL LOW (ref 22–32)
Calcium: 7.9 mg/dL — ABNORMAL LOW (ref 8.9–10.3)
Chloride: 107 mmol/L (ref 101–111)
Creatinine, Ser: 0.96 mg/dL (ref 0.61–1.24)
GFR calc Af Amer: >60 mL/min (ref >60)
GFR calc non Af Amer: >60 mL/min (ref >60)
Glucose, Bld: 79 mg/dL (ref 65–99)
Potassium: 3 mmol/L — ABNORMAL LOW (ref 3.5–5.1)
Sodium: 138 mmol/L (ref 135–145)

## 2015-09-12 LAB — URINE CULTURE

## 2015-09-12 LAB — MAGNESIUM: Magnesium: 1.8 mg/dL (ref 1.7–2.4)

## 2015-09-12 LAB — PHOSPHORUS: PHOSPHORUS: 2.1 mg/dL — AB (ref 2.5–4.6)

## 2015-09-12 SURGERY — IRRIGATION AND DEBRIDEMENT KNEE WITH POLY EXCHANGE
Anesthesia: Monitor Anesthesia Care | Site: Knee | Laterality: Left

## 2015-09-12 MED ORDER — FENTANYL CITRATE (PF) 100 MCG/2ML IJ SOLN
INTRAMUSCULAR | Status: DC | PRN
  Administered 2015-09-12: 100 ug via INTRAVENOUS

## 2015-09-12 MED ORDER — MIDAZOLAM HCL 5 MG/5ML IJ SOLN
INTRAMUSCULAR | Status: DC | PRN
  Administered 2015-09-12 (×2): 1 mg via INTRAVENOUS

## 2015-09-12 MED ORDER — PROPOFOL 500 MG/50ML IV EMUL
INTRAVENOUS | Status: DC | PRN
  Administered 2015-09-12: 25 ug/kg/min via INTRAVENOUS

## 2015-09-12 MED ORDER — ACETAMINOPHEN 325 MG PO TABS: 325.0000 mg | ORAL_TABLET | ORAL | Status: DC | PRN

## 2015-09-12 MED ORDER — OXYCODONE HCL 5 MG PO TABS
5.0000 mg | ORAL_TABLET | ORAL | Status: DC | PRN
  Administered 2015-09-13 – 2015-09-15 (×4): 5 mg via ORAL
  Administered 2015-09-15 – 2015-09-16 (×2): 10 mg via ORAL
  Administered 2015-09-16 – 2015-09-17 (×3): 5 mg via ORAL
  Filled 2015-09-12 (×4): qty 1
  Filled 2015-09-12 (×2): qty 2
  Filled 2015-09-12: qty 1
  Filled 2015-09-12 (×2): qty 2

## 2015-09-12 MED ORDER — SODIUM CHLORIDE 0.9% FLUSH
10.0000 mL | Freq: Two times a day (BID) | INTRAVENOUS | Status: DC
Start: 2015-09-12 — End: 2015-09-17
  Administered 2015-09-13 – 2015-09-16 (×9): 10 mL

## 2015-09-12 MED ORDER — ACETAMINOPHEN 160 MG/5ML PO SOLN: 325.0000 mg | ORAL | Status: DC | PRN

## 2015-09-12 MED ORDER — LACTATED RINGERS IV SOLN
INTRAVENOUS | Status: DC
  Administered 2015-09-12: 17:00:00 via INTRAVENOUS
  Administered 2015-09-12: 1000 mL via INTRAVENOUS

## 2015-09-12 MED ORDER — VANCOMYCIN HCL 1000 MG IV SOLR
INTRAVENOUS | Status: AC
  Filled 2015-09-12: qty 1000

## 2015-09-12 MED ORDER — ISOPROPYL ALCOHOL 70 % SOLN
Status: AC
  Filled 2015-09-12: qty 480

## 2015-09-12 MED ORDER — LIDOCAINE HCL (CARDIAC) 20 MG/ML IV SOLN
INTRAVENOUS | Status: DC | PRN
  Administered 2015-09-12: 50 mg via INTRAVENOUS

## 2015-09-12 MED ORDER — OXYCHLOROSENE SODIUM POWD
Status: DC | PRN
  Administered 2015-09-12: 1

## 2015-09-12 MED ORDER — PROPOFOL 10 MG/ML IV BOLUS
INTRAVENOUS | Status: AC
  Filled 2015-09-12: qty 20

## 2015-09-12 MED ORDER — OXYCHLOROSENE SODIUM POWD
Freq: Once | Status: DC
  Filled 2015-09-12: qty 2

## 2015-09-12 MED ORDER — TRANEXAMIC ACID 1000 MG/10ML IV SOLN
1000.0000 mg | Freq: Once | INTRAVENOUS | Status: AC
  Administered 2015-09-12: 1000 mg via INTRAVENOUS
  Filled 2015-09-12: qty 10

## 2015-09-12 MED ORDER — FUROSEMIDE 10 MG/ML IJ SOLN
INTRAMUSCULAR | Status: AC
  Filled 2015-09-12: qty 2

## 2015-09-12 MED ORDER — FENTANYL CITRATE (PF) 100 MCG/2ML IJ SOLN
INTRAMUSCULAR | Status: AC
  Filled 2015-09-12: qty 2

## 2015-09-12 MED ORDER — KETOROLAC TROMETHAMINE 15 MG/ML IJ SOLN
INTRAMUSCULAR | Status: DC | PRN
  Administered 2015-09-12: 15 mg via INTRAVENOUS

## 2015-09-12 MED ORDER — MIDAZOLAM HCL 2 MG/2ML IJ SOLN
INTRAMUSCULAR | Status: AC
  Filled 2015-09-12: qty 2

## 2015-09-12 MED ORDER — HYDROMORPHONE HCL 1 MG/ML IJ SOLN: 0.2500 mg | INTRAMUSCULAR | Status: DC | PRN

## 2015-09-12 MED ORDER — VANCOMYCIN HCL IN DEXTROSE 1-5 GM/200ML-% IV SOLN
1000.0000 mg | Freq: Two times a day (BID) | INTRAVENOUS | Status: DC
  Administered 2015-09-12 – 2015-09-13 (×3): 1000 mg via INTRAVENOUS
  Filled 2015-09-12 (×3): qty 200

## 2015-09-12 MED ORDER — SODIUM CHLORIDE 0.9% FLUSH
10.0000 mL | INTRAVENOUS | Status: DC | PRN
  Administered 2015-09-16: 10 mL
  Filled 2015-09-12: qty 40

## 2015-09-12 MED ORDER — BUPIVACAINE IN DEXTROSE 0.75-8.25 % IT SOLN
INTRATHECAL | Status: DC | PRN
  Administered 2015-09-12: 2 mL via INTRATHECAL

## 2015-09-12 MED ORDER — POTASSIUM CHLORIDE 10 MEQ/100ML IV SOLN
10.0000 meq | INTRAVENOUS | Status: AC
Start: 2015-09-12 — End: 2015-09-12
  Administered 2015-09-12 (×4): 10 meq via INTRAVENOUS
  Filled 2015-09-12 (×4): qty 100

## 2015-09-12 MED ORDER — SODIUM CHLORIDE 0.9 % IR SOLN
Status: DC | PRN
  Administered 2015-09-12 (×3): 3000 mL

## 2015-09-12 MED ORDER — OXYCODONE HCL 5 MG PO TABS: 5.0000 mg | ORAL_TABLET | Freq: Once | ORAL | Status: DC | PRN

## 2015-09-12 MED ORDER — HYDROGEN PEROXIDE 3 % EX SOLN
CUTANEOUS | Status: DC | PRN
  Administered 2015-09-12: 1

## 2015-09-12 MED ORDER — ONDANSETRON HCL 4 MG/2ML IJ SOLN
INTRAMUSCULAR | Status: AC
  Filled 2015-09-12: qty 2

## 2015-09-12 MED ORDER — SODIUM CHLORIDE 0.9 % IJ SOLN
INTRAMUSCULAR | Status: AC
  Filled 2015-09-12: qty 50

## 2015-09-12 MED ORDER — MAGNESIUM SULFATE 2 GM/50ML IV SOLN
2.0000 g | Freq: Once | INTRAVENOUS | Status: AC
  Administered 2015-09-12: 2 g via INTRAVENOUS
  Filled 2015-09-12: qty 50

## 2015-09-12 MED ORDER — BUPIVACAINE-EPINEPHRINE (PF) 0.25% -1:200000 IJ SOLN
INTRAMUSCULAR | Status: AC
  Filled 2015-09-12: qty 30

## 2015-09-12 MED ORDER — VANCOMYCIN HCL 1000 MG IV SOLR
INTRAVENOUS | Status: DC | PRN
  Administered 2015-09-12: 1000 mg

## 2015-09-12 MED ORDER — KETOROLAC TROMETHAMINE 30 MG/ML IJ SOLN
INTRAMUSCULAR | Status: AC
  Filled 2015-09-12: qty 1

## 2015-09-12 MED ORDER — ONDANSETRON HCL 4 MG/2ML IJ SOLN
INTRAMUSCULAR | Status: DC | PRN
  Administered 2015-09-12: 4 mg via INTRAVENOUS

## 2015-09-12 MED ORDER — LIDOCAINE HCL (CARDIAC) 20 MG/ML IV SOLN
INTRAVENOUS | Status: AC
  Filled 2015-09-12: qty 5

## 2015-09-12 MED ORDER — HYDROGEN PEROXIDE 3 % EX SOLN
CUTANEOUS | Status: AC
  Filled 2015-09-12: qty 473

## 2015-09-12 MED ORDER — OXYCODONE HCL 5 MG/5ML PO SOLN: 5.0000 mg | Freq: Once | ORAL | Status: DC | PRN

## 2015-09-12 MED ORDER — FUROSEMIDE 10 MG/ML IJ SOLN
10.0000 mg | Freq: Once | INTRAMUSCULAR | Status: AC
  Administered 2015-09-12: 10 mg via INTRAVENOUS

## 2015-09-12 MED ORDER — KCL IN DEXTROSE-NACL 20-5-0.9 MEQ/L-%-% IV SOLN
INTRAVENOUS | Status: DC
  Administered 2015-09-12: 09:00:00 via INTRAVENOUS
  Filled 2015-09-12: qty 1000

## 2015-09-12 MED ORDER — MORPHINE SULFATE (PF) 2 MG/ML IV SOLN
1.0000 mg | INTRAVENOUS | Status: DC | PRN
  Administered 2015-09-12: 1 mg via INTRAVENOUS
  Filled 2015-09-12 (×2): qty 1

## 2015-09-12 SURGICAL SUPPLY — 53 items
BAG SPEC THK2 15X12 ZIP CLS (MISCELLANEOUS) ×1
BAG ZIPLOCK 12X15 (MISCELLANEOUS) ×3 IMPLANT
BANDAGE ACE 4X5 VEL STRL LF (GAUZE/BANDAGES/DRESSINGS) IMPLANT
BANDAGE ACE 6X5 VEL STRL LF (GAUZE/BANDAGES/DRESSINGS) ×3 IMPLANT
BANDAGE ELASTIC 4 VELCRO ST LF (GAUZE/BANDAGES/DRESSINGS) ×2 IMPLANT
BANDAGE ELASTIC 6 VELCRO ST LF (GAUZE/BANDAGES/DRESSINGS) ×2 IMPLANT
CLOTH BEACON ORANGE TIMEOUT ST (SAFETY) ×3 IMPLANT
CUFF TOURN SGL QUICK 34 (TOURNIQUET CUFF) ×3
CUFF TRNQT CYL 34X4X40X1 (TOURNIQUET CUFF) ×1 IMPLANT
DECANTER SPIKE VIAL GLASS SM (MISCELLANEOUS) ×3 IMPLANT
DRAPE U-SHAPE 47X51 STRL (DRAPES) ×3 IMPLANT
DRSG AQUACEL AG ADV 3.5X10 (GAUZE/BANDAGES/DRESSINGS) IMPLANT
DRSG AQUACEL AG ADV 3.5X14 (GAUZE/BANDAGES/DRESSINGS) IMPLANT
DRSG MEPILEX BORDER 4X12 (GAUZE/BANDAGES/DRESSINGS) ×2 IMPLANT
DURAPREP 26ML APPLICATOR (WOUND CARE) ×6 IMPLANT
ELECT REM PT RETURN 9FT ADLT (ELECTROSURGICAL) ×3
ELECTRODE REM PT RTRN 9FT ADLT (ELECTROSURGICAL) ×1 IMPLANT
GLOVE BIOGEL M 7.0 STRL (GLOVE) IMPLANT
GLOVE BIOGEL PI IND STRL 7.5 (GLOVE) ×1 IMPLANT
GLOVE BIOGEL PI IND STRL 8.5 (GLOVE) ×1 IMPLANT
GLOVE BIOGEL PI INDICATOR 7.5 (GLOVE) ×2
GLOVE BIOGEL PI INDICATOR 8.5 (GLOVE) ×2
GLOVE ECLIPSE 8.0 STRL XLNG CF (GLOVE) ×3 IMPLANT
GLOVE ORTHO TXT STRL SZ7.5 (GLOVE) ×3 IMPLANT
GOWN STRL REUS W/TWL LRG LVL3 (GOWN DISPOSABLE) ×3 IMPLANT
GOWN STRL REUS W/TWL XL LVL3 (GOWN DISPOSABLE) ×3 IMPLANT
HANDPIECE INTERPULSE COAX TIP (DISPOSABLE) ×3
INSERT TIB TC3 RP SZ5 15 (Knees) ×2 IMPLANT
LIQUID BAND (GAUZE/BANDAGES/DRESSINGS) IMPLANT
MANIFOLD NEPTUNE II (INSTRUMENTS) ×3 IMPLANT
NDL SAFETY ECLIPSE 18X1.5 (NEEDLE) IMPLANT
NEEDLE HYPO 18GX1.5 SHARP (NEEDLE)
NS IRRIG 1000ML POUR BTL (IV SOLUTION) ×6 IMPLANT
PACK TOTAL KNEE CUSTOM (KITS) ×3 IMPLANT
PAD ABD 8X10 STRL (GAUZE/BANDAGES/DRESSINGS) ×2 IMPLANT
POSITIONER SURGICAL ARM (MISCELLANEOUS) ×3 IMPLANT
SET HNDPC FAN SPRY TIP SCT (DISPOSABLE) ×1 IMPLANT
SPONGE LAP 18X18 X RAY DECT (DISPOSABLE) IMPLANT
STAPLER VISISTAT 35W (STAPLE) IMPLANT
SUT MNCRL AB 3-0 PS2 18 (SUTURE) IMPLANT
SUT MNCRL AB 4-0 PS2 18 (SUTURE) IMPLANT
SUT PDS AB 1 CT1 27 (SUTURE) ×6 IMPLANT
SUT VIC AB 1 CT1 36 (SUTURE) ×3 IMPLANT
SUT VIC AB 2-0 CT1 27 (SUTURE) ×9
SUT VIC AB 2-0 CT1 TAPERPNT 27 (SUTURE) ×3 IMPLANT
SUT VLOC 180 0 24IN GS25 (SUTURE) IMPLANT
SWAB COLLECTION DEVICE MRSA (MISCELLANEOUS) IMPLANT
SWAB CULTURE ESWAB REG 1ML (MISCELLANEOUS) IMPLANT
SYR 50ML LL SCALE MARK (SYRINGE) ×3 IMPLANT
TRAY FOLEY W/METER SILVER 14FR (SET/KITS/TRAYS/PACK) ×3 IMPLANT
TRAY FOLEY W/METER SILVER 16FR (SET/KITS/TRAYS/PACK) ×3 IMPLANT
WATER STERILE IRR 1500ML POUR (IV SOLUTION) ×3 IMPLANT
WRAP KNEE MAXI GEL POST OP (GAUZE/BANDAGES/DRESSINGS) ×3 IMPLANT

## 2015-09-12 NOTE — Transfer of Care (Signed)
Immediate Anesthesia Transfer of Care Note  Patient: Hunter Santos  Procedure(s) Performed: Procedure(s): IRRIGATION AND DEBRIDEMENT KNEE WITH POLY EXCHANGE LEFT TOTAL KNEE (Left)  Patient Location: PACU  Anesthesia Type:Spinal  Level of Consciousness: awake, alert  and oriented  Airway & Oxygen Therapy: Patient Spontanous Breathing and Patient connected to face mask oxygen  Post-op Assessment: Report given to RN and Post -op Vital signs reviewed and stable  Post vital signs: Reviewed and stable  Last Vitals:  Filed Vitals:   09/12/15 1200 09/12/15 1300  BP: 120/67 125/69  Pulse: 102 96  Temp: 36.7 C   Resp: 28 25    Complications: No apparent anesthesia complications

## 2015-09-12 NOTE — Progress Notes (Signed)
Patient ID: Hunter Santos, male   DOB: 04/22/1951, 65 y.o.   MRN: [id number]   Mr Zalar is well known to me in regards to his left knee.  He has history of being treated for septic joint arthroplasty Admission reviewed  He was schedule to have his left knee operated on today already due to an acute onset of swelling, pain that occurred last week after working in the yard  Stable per medicine and CCM  To OR today for attempted joint salvage with I&D, poly exchange NPO  Previously reviewed with risks and benefits of planned treatment

## 2015-09-12 NOTE — Consult Note (Signed)
          [facility] for Infectious Disease    Date of Admission:  09/11/2015   Total days of antibiotics 2  Patient Active Problem List   Diagnosis Date Noted  . Streptococcal bacteremia 09/12/2015    Priority: High  . Septic shock ([place]) 09/11/2015    Priority: High  . Severe sepsis ([place]) 09/11/2015    Priority: High  . Infection of prosthetic left knee joint ([place]) 06/16/2013    Priority: High  . S/P revision of total knee 08/17/2013    Priority: Medium  . S/P left knee resection, abx spacer 06/15/2013    Priority: Medium  . High anion gap metabolic acidosis [id number]  . Acute renal failure (ARF) ([place]) 09/11/2015  . Hypokalemia 09/11/2015  . Lactic acid acidosis 09/11/2015  . DM (diabetes mellitus) ([place]) 06/16/2013  . HTN (hypertension) 06/16/2013  . OSA (obstructive sleep apnea) 06/16/2013  . Expected blood loss anemia 04/30/2012  . Obese 04/30/2012  . Hyponatremia 04/30/2012   Hunter Santos is a 65 year old with DJD and a prior left total knee arthroplasty. In 2014 he developed coagulase-negative staph infection of his prosthetic knee and underwent a two-stage reconstruction and appeared to have been cured. He recently fell and scraped his left knee. He then began having pain and swelling. Apparently he had an outpatient aspiration of his knee. I've been unable to locate the results of any outpatient cultures. He became septic and was admitted urgently yesterday. Admission blood cultures are growing gram-positive cocci in pairs and chains. He is currently in the operating room. I will continue vancomycin alone pending final blood culture results. I will repeat blood cultures in the morning and order a transthoracic echocardiogram.         [name], MD [facility] for [place] [phone] pager   [phone] cell 07/19/2015, 1:32 PM

## 2015-09-12 NOTE — Progress Notes (Signed)
PROGRESS NOTE  Hunter Santos [id number] DOB: 1951-03-24 DOA: 09/11/2015 PCP: Dwan Bolt, MD  [name], 65 y.o. male, has a history of infection in the left total knee arthroplasty. S/p 6 weeks of IV antibiotics in 2015. In early February of 2015 he underwent revision of the total knee arthroplasty and reimplantation of a total knee arthroplasty after removal of antibiotic spacer.  And then about a week ago he started developed pain in the left knee. The knee started swelling. He had difficulty walking. He subsequently went to see his orthopedic doctor on Thursday or Friday. Apparently, they performed x-rays of the knee. No fractures were noted. The knee was noted to be swollen. Arthrocentesis was done and apparently raised concern for infection. So the patient was scheduled for irrigation of that joint on Monday.   Assessment/Plan: Severe sepsis secondary to left knee infection:  -ortho (Dr. Alvan Dame) consulted by ER doc -Sepsis protocol has been initiated -s/p 4 L of normal saline -Lactic acid level has normalized -pro-calcitonin improved -vancomycin and Zosyn -Blood cultures 2/2 gram + cocci -appreciated PCCM  Gram + bacteremia -IV vanc/zosyn for now- narrow as able  High anion gap metabolic acidosis: -resolved with IVF  history of diabetes mellitus type 2:  -HgbA1C -SSI   history of essential hypertension:  -was hypotensive: hold all of his antihypertensive medications.  Mild acute renal failure with hypokalemia:  -Replete potassium. -Mg low end of normal, will replace  Code Status: full Family Communication: patient Disposition Plan:    Consultants:  PCCM  [place]  Procedures:      HPI/Subjective: Asking for ice chips Some soreness in his shoulders  Objective: Filed Vitals:   09/12/15 0500 09/12/15 0600  BP: 109/59 124/62  Pulse: 98 101  Temp:    Resp: 30 29    Intake/Output Summary (Last 24 hours) at 09/12/15 0754 Last data  filed at 09/12/15 0630  Gross per 24 hour  Intake 1225.75 ml  Output   1550 ml  Net -324.25 ml   Filed Weights   09/11/15 1053 09/11/15 1615  Weight: 127.007 kg (280 lb) 119.2 kg (262 lb 12.6 oz)    Exam:   General:  Awake, NAD  Cardiovascular: rrr  Respiratory: clear  Abdomen: +BS, soft  Musculoskeletal: +LE edema   Data Reviewed: Basic Metabolic Panel:  Recent Labs Lab 09/11/15 1105 09/11/15 1430 09/11/15 1521 09/11/15 2029 09/12/15 0324  NA 133* 133*  --  139 138  K 2.7* 2.1*  --  2.6* 3.0*  CL 97* 103  --  107 107  CO2 13* 18*  --  18* 19*  GLUCOSE 221* 142*  --  118* 79  BUN 30* 28*  --  25* 23*  CREATININE 1.29* 1.26*  --  1.06 0.96  CALCIUM 9.2 8.1*  --  8.1* 7.9*  MG  --   --  1.7  --  1.8  PHOS  --   --  1.0*  --  2.1*   Liver Function Tests:  Recent Labs Lab 09/11/15 1105 09/11/15 1521  AST 23 78*  ALT 20 35  ALKPHOS 87 70  BILITOT 2.4* 1.8*  PROT 6.9 5.4*  ALBUMIN 2.7* 2.2*   No results for input(s): LIPASE, AMYLASE in the last 168 hours. No results for input(s): AMMONIA in the last 168 hours. CBC:  Recent Labs Lab 09/11/15 1105 09/12/15 0324  WBC 5.7 13.8*  NEUTROABS 5.2 11.8*  HGB 15.2 12.0*  HCT 44.9 35.8*  MCV 87.2 87.3  PLT 222 192   Cardiac Enzymes: No results for input(s): CKTOTAL, CKMB, CKMBINDEX, TROPONINI in the last 168 hours. BNP (last 3 results) No results for input(s): BNP in the last 8760 hours.  ProBNP (last 3 results) No results for input(s): PROBNP in the last 8760 hours.  CBG:  Recent Labs Lab 09/11/15 1634 09/11/15 1958 09/12/15 0006 09/12/15 0435  GLUCAP 132* 141* 86 88    Recent Results (from the past 240 hour(s))  Blood Culture (routine x 2)     Status: None (Preliminary result)   Collection Time: 09/11/15 11:00 AM  Result Value Ref Range Status   Specimen Description BLOOD RIGHT ANTECUBITAL  Final   Special Requests BOTTLES DRAWN AEROBIC AND ANAEROBIC 5CC  Final   Culture  Setup Time    Final    GRAM POSITIVE COCCI IN PAIRS AND CHAINS IN BOTH AEROBIC AND ANAEROBIC BOTTLES CRITICAL RESULT CALLED TO, READ BACK BY AND VERIFIED WITH: A CHAVZE,RN [id number] 0110 [place] Performed at [facility]    Culture PENDING  Incomplete   Report Status PENDING  Incomplete  Blood Culture (routine x 2)     Status: None (Preliminary result)   Collection Time: 09/11/15 11:05 AM  Result Value Ref Range Status   Specimen Description BLOOD BLOOD RIGHT FOREARM  Final   Special Requests BOTTLES DRAWN AEROBIC AND ANAEROBIC 5CC  Final   Culture  Setup Time   Final    GRAM POSITIVE COCCI IN PAIRS AND CHAINS IN BOTH AEROBIC AND ANAEROBIC BOTTLES CRITICAL RESULT CALLED TO, READ BACK BY AND VERIFIED WITH: A CHAVZE,RN [id number] [place] Performed at [facility]    Culture PENDING  Incomplete   Report Status PENDING  Incomplete  MRSA PCR Screening     Status: Abnormal   Collection Time: 09/11/15  4:28 PM  Result Value Ref Range Status   MRSA by PCR POSITIVE (A) NEGATIVE Final    Comment:        The GeneXpert MRSA Assay (FDA approved for NASAL specimens only), is one component of a comprehensive MRSA colonization surveillance program. It is not intended to diagnose MRSA infection nor to guide or monitor treatment for MRSA infections. RESULT CALLED TO, READ BACK BY AND VERIFIED WITH: Q.MBEMENU,RN AT 2014 ON 09/11/15 BY W.SHEA      Studies: Dg Chest Port 1 View  09/11/2015  CLINICAL DATA:  Acute fever and shortness of breath. EXAM: PORTABLE CHEST 1 VIEW COMPARISON:  10/20/2010 chest radiograph FINDINGS: The cardiomediastinal silhouette is unremarkable. There is no evidence of focal airspace disease, pulmonary edema, suspicious pulmonary nodule/mass, pleural effusion, or pneumothorax. No acute bony abnormalities are identified. IMPRESSION: No active disease. Electronically Signed   By: Margarette Canada M.D.   On: 09/11/2015 14:02    Scheduled Meds: . antiseptic oral rinse  7 mL Mouth  Rinse BID  . Chlorhexidine Gluconate Cloth  6 each Topical Q0600  . heparin  5,000 Units Subcutaneous 3 times per day  . insulin aspart  0-15 Units Subcutaneous 6 times per day  . magnesium sulfate 1 - 4 g bolus IVPB  2 g Intravenous Once  . mupirocin ointment  1 application Nasal BID  . phosphorus  250 mg Oral TID  . piperacillin-tazobactam (ZOSYN)  IV  3.375 g Intravenous Q8H  . potassium chloride  10 mEq Intravenous Q1 Hr x 4  . sodium chloride flush  3 mL Intravenous Q12H  . vancomycin  1,500 mg Intravenous Q24H   Continuous Infusions: . 0.9 %  NaCl with KCl 20 mEq / L 125 mL/hr at 09/12/15 [id number]   Antibiotics Given (last 72 hours)    Date/Time Action Medication Dose Rate   09/11/15 2059 Given   piperacillin-tazobactam (ZOSYN) IVPB 3.375 g 3.375 g 12.5 mL/hr   09/12/15 0407 Given   piperacillin-tazobactam (ZOSYN) IVPB 3.375 g 3.375 g 12.5 mL/hr      Principal Problem:   Septic shock (HCC) Active Problems:   DM (diabetes mellitus) ([place])   Infection of prosthetic left knee joint (HCC)   High anion gap metabolic acidosis   Acute renal failure (ARF) (HCC)   Hypokalemia   Severe sepsis (HCC)   Lactic acid acidosis   Acidosis, metabolic    Time spent: 35 min    [facility] Pager [phone]. If 7PM-7AM, please contact night-coverage at [email or web address], password [facility] 09/12/2015, 7:54 AM  LOS: 1 day

## 2015-09-12 NOTE — Anesthesia Preprocedure Evaluation (Signed)
Anesthesia Evaluation  Patient identified by MRN, date of birth, ID band Patient awake    Reviewed: Allergy & Precautions, NPO status , Patient's Chart, lab work & pertinent test results  Airway Mallampati: III  TM Distance: >3 FB Neck ROM: Full    Dental  (+) Teeth Intact   Pulmonary neg shortness of breath, neg sleep apnea, neg COPD, neg recent URI, neg PE   breath sounds clear to auscultation       Cardiovascular hypertension, Pt. on medications (-) angina(-) Past MI and (-) CHF  Rhythm:Regular     Neuro/Psych negative neurological ROS  negative psych ROS   GI/Hepatic negative GI ROS, Neg liver ROS,   Endo/Other  diabetes, Type 2  Renal/GU Renal disease     Musculoskeletal  (+) Arthritis ,   Abdominal   Peds  Hematology  (+) anemia ,   Anesthesia Other Findings   Reproductive/Obstetrics                             Anesthesia Physical Anesthesia Plan  ASA: III  Anesthesia Plan: MAC and Spinal   Post-op Pain Management:    Induction: Intravenous  Airway Management Planned: Natural Airway, Nasal Cannula and Simple Face Mask  Additional Equipment: None  Intra-op Plan:   Post-operative Plan:   Informed Consent: I have reviewed the patients History and Physical, chart, labs and discussed the procedure including the risks, benefits and alternatives for the proposed anesthesia with the patient or authorized representative who has indicated his/her understanding and acceptance.     Plan Discussed with: CRNA and Surgeon  Anesthesia Plan Comments:         Anesthesia Quick Evaluation

## 2015-09-12 NOTE — Anesthesia Postprocedure Evaluation (Signed)
Anesthesia Post Note  Patient: Hunter Santos  Procedure(s) Performed: Procedure(s) (LRB): IRRIGATION AND DEBRIDEMENT KNEE WITH POLY EXCHANGE LEFT TOTAL KNEE (Left)  Patient location during evaluation: PACU Anesthesia Type: Spinal Level of consciousness: awake and alert Pain management: pain level controlled Vital Signs Assessment: post-procedure vital signs reviewed and stable Respiratory status: spontaneous breathing, nonlabored ventilation, respiratory function stable and patient connected to nasal cannula oxygen Cardiovascular status: blood pressure returned to baseline and stable Postop Assessment: no signs of nausea or vomiting Anesthetic complications: no    Last Vitals:  Filed Vitals:   09/12/15 1850 09/12/15 1900  BP: 105/64 115/59  Pulse: 95 102  Temp:    Resp: 35 19    Last Pain:  Filed Vitals:   09/12/15 1908  PainSc: 6                  Doral Digangi L

## 2015-09-12 NOTE — Progress Notes (Signed)
Peripherally Inserted Central Catheter/Midline Placement  The IV Nurse has discussed with the patient and/or persons authorized to consent for the patient, the purpose of this procedure and the potential benefits and risks involved with this procedure.  The benefits include less needle sticks, lab draws from the catheter and patient may be discharged home with the catheter.  Risks include, but not limited to, infection, bleeding, blood clot (thrombus formation), and puncture of an artery; nerve damage and irregular heat beat.  Alternatives to this procedure were also discussed.  Wife signed consent at the request of the patient.  PICC/Midline Placement Documentation        Hunter Santos, Hunter Santos 09/12/2015, 9:55 PM

## 2015-09-12 NOTE — Op Note (Signed)
NAMEMarland Kitchen  Hunter, Santos NO.:  [id number]  MEDICAL RECORD NO.:  [id number]  LOCATION:  [id number]                         FACILITY:  [facility]  PHYSICIAN:  Pietro Cassis. Alvan Dame, M.D.  DATE OF BIRTH:  [birth year]  DATE OF PROCEDURE:  09/12/2015 DATE OF DISCHARGE:                              OPERATIVE REPORT   PREOPERATIVE DIAGNOSIS:  Septic left total knee arthroplasty.  POSTOPERATIVE DIAGNOSIS:  Septic left total knee arthroplasty.  PROCEDURE:  Open excisional and nonexcisional debridement of left knee with polyethylene exchange.  Hunter sharp excision including his skin incision of approximately 8-10 inches, soft tissue synovectomy as well as some bony overgrowths.  Nonexcisional debridement including irrigated with total of 9 L normal saline solution pulse lavage, 1 L of Clorpactin 2 g, mixed with 1 L of normal saline solution as well as a combination of Betadine, peroxide and saline.  Hunter polyethylene insert that was replaced was a size 5 x 15-mm TC3 insert.  SURGEON:  Pietro Cassis. Alvan Dame, M.D.  ASSISTANT:  Hunter Orleans, PA-C.  Note that Mr. Hunter Santos was present for Hunter entirety of Hunter case from preoperative position, perioperative management of Hunter operative extremity, general facilitation of Hunter case and primary wound closure.  ANESTHESIA:  Spinal.  SPECIMENS:  Joint fluid was sent as a synovial fluid count and Gram stain culture to Pathology.  DRAINS:  None.  COMPLICATION:  None.  TOURNIQUET TIME:  49 minutes at 250 mmHg.  BLOOD LOSS:  Probably about 100-150 mL.  INDICATIONS FOR Hunter PROCEDURE:  Hunter Santos is a very pleasant 65 year old male, known to me with a history of an index total knee arthroplasty in 2014.  This was complicated by infection, which ultimately led to a two-stage treatment of his knee infection.  He had been doing well for greater than year time when he returned to Hunter office acutely last week with increasing pain in his left knee after working  in Hunter yard and doing a lot of tilling.  He did present with an effusion, was seen by my PA, Hunter Santos, who rightfully aspirated his knee and revealing some purulent fluid that was sent to Pathology.  Based on these findings, I discussed with Hunter Santos that he should have surgery sooner than later and we scheduled for surgery on Monday, Hunter 27th.  Over Hunter weekend, Hunter Santos began to feel worse and as was instructed, he presented to Hunter emergency room on Sunday and was admitted to [facility] with concerns for sepsis.  He was stabilized fairly medially with just a fluid bolus and had a metabolic acidosis recognized by Hunter Critical Care folks perhaps related to his current condition, but also Diabetes Medicine.  He was stabilized.  He was seen and evaluated and Hunter surgical plan was unchanged as previously reviewed.  Risks and benefits have been discussed and reviewed in Hunter office as well as over Hunter phone, consent was obtained for treatment of infection.  PROCEDURE IN DETAIL:  Hunter Santos was brought to Hunter operative theater. Once adequate anesthesia, preoperative antibiotics had been administered in Hunter hospital, placed on vancomycin and Zosyn.  He had received his appropriate scheduled dosing.  His left lower extremity  was then prepped and draped in sterile fashion with Hunter [facility] leg holder.  Hunter time-out was performed identifying Hunter Santos, planned procedure and extremity. Hunter leg was exsanguinated and tourniquet was elevated to 250 mmHg. Midline incision was made followed by median parapatellar arthrotomy. Hunter left knee arthrotomy was then made and we did encounter purulent fluid, which was aspirated using Hunter syringe and sent to Pathology for cell count, Gram stain and culture.  I completed Hunter arthrotomy and evacuated Hunter remaining fluid from Hunter knee.  At this point, soft tissue exposure was carried out.  Here, we sharply excised his skin, underlying  tissues as well as performed a complete synovectomy, medial, lateral and suprapatellar.  I then removed some bony overgrowth that found around Hunter patella.  Once Hunter knee was exposed, Hunter patella was easily subluxed out of Hunter way.  At this point, I was able to subluxate Hunter knee anteriorly and removed Hunter TC3 component without difficulty.  At this point, we irrigated Hunter knee with 3 liters of normal saline solution.  Following this and further debridements, I then placed a solution of 2 g of Clorpactin with saline allowing this to sit for an interval of approximately 8-10 minutes.  Once this was done, I re- irrigated Hunter knee with normal saline solution removing Hunter chlorine substrate and then irrigated with 3 liters of normal saline solution. Following this, I allowed Hunter knee to be bathe in a combination of Betadine, peroxide and saline.  After 5-7 minutes bath in this, Hunter knee was re-irrigated with third 3 L bag of fluid with pulse lavage.  Once this was completed, we changed gloves and replaced Hunter final polyethylene insert.  Once this was done, Hunter tourniquet had been let down at 49 minutes without significant hemostasis, required, sprinkled half of 1 g of vancomycin powder into Hunter deep wound and soft tissues and then reapproximated Hunter extensor mechanism using #1 PDS sutures. Hunter remaining half of Hunter 1 g of vancomycin was sprinkled into Hunter superficial layer and then I reapproximated Hunter skin using 2-0 Vicryl and then staples on Hunter skin.  Hunter skin was cleaned, dried and dressed sterilely with Mepilex dressing and an Ace wrap.  He was then brought to Hunter recovery room in stable condition tolerating Hunter procedure well.  Note, at Hunter time of this dictation, we had identified from his aspiration in Hunter office that he had a beta-hemolytic strep.  We will pass this information onto Hunter Infectious Disease consult that will be obtained tomorrow.  PICC line will be placed.  Our goal  at this point is to salvage his joint based on Hunter previous revision and reimplantation to try to prevent Hunter morbidity of going through that process again. This will be stressed to Hunter Infectious Disease.  At this point, I cannot rule out Hunter possible need for future surgeries.     Pietro Cassis Alvan Dame, M.D.    MDO/MEDQ  D:  09/12/2015  T:  09/12/2015  Job:  [id number]

## 2015-09-12 NOTE — Anesthesia Procedure Notes (Signed)
Spinal Patient location during procedure: OR Staffing Anesthesiologist: Sincerity Cedar Preanesthetic Checklist Completed: patient identified, surgical consent, pre-op evaluation, timeout performed, IV checked, risks and benefits discussed and monitors and equipment checked Spinal Block Patient position: left lateral decubitus Prep: site prepped and draped and DuraPrep Patient monitoring: heart rate, cardiac monitor, continuous pulse ox and blood pressure Approach: midline Location: L4-5 Injection technique: single-shot Needle Needle type: Spinocan  Needle gauge: 22 G Needle length: 9 cm Assessment Sensory level: T6

## 2015-09-12 NOTE — Progress Notes (Signed)
Pharmacy Antibiotic Note  Hunter Santos is a 65 y.o. male admitted on 09/11/2015 with sepsis secondary to left knee infection.  Pharmacy has been consulted for vancomycin and zosyn dosing. Pt received initial doses in the ED. Pt received 1g vanc IV in the ED, appropriate LD for this pt should have been 2.5g IV.  Due to improved renal function, elevated white count, and low LD of vanc the following is recommended.  Plan: Vancomycin 1g IV q12h, first dose to start at 1200 Zosyn 3.375g IV Q8H infused over 4hrs. Follow renal function, cultures, clinical course vanc trough at steady state as indicated  Scr 0.96, CrCl ~ 78 (N)  Height: 6\' 2"  (188 cm) Weight: 262 lb 12.6 oz (119.2 kg) IBW/kg (Calculated) : 82.2  Temp (24hrs), Avg:99.4 F (37.4 C), Min:97.3 F (36.3 C), Max:102.4 F (39.1 C)   Recent Labs Lab 09/11/15 1105  09/11/15 1430  09/11/15 1730 09/11/15 2029 09/11/15 2331 09/12/15 0324 09/12/15 0713  WBC 5.7  --   --   --   --   --   --  13.8*  --   CREATININE 1.29*  --  1.26*  --   --  1.06  --  0.96  --   LATICACIDVEN  --   < >  --   < > 2.2* 1.7 1.1 1.1 1.1  < > = values in this interval not displayed.  Estimated Creatinine Clearance: 106.7 mL/min (by C-G formula based on Cr of 0.96).    No Known Allergies  Antimicrobials this admission: 2/26 vanc >> 2/26 zosyn >>  Dose adjustments this admission: Vanc 1500mg  IV q24h to 1g IV q12h  Microbiology results: 2/26 BCx: gram (+) cocci in pairs and chains 2/26 UCx:   Sputum:  2/26 MRSA PCR: Pos  Thank you for allowing pharmacy to be a part of this patient's care.  Darvin Neighbours, [facility] PharmD Candidate  09/12/2015, 9:24 AM

## 2015-09-12 NOTE — Brief Op Note (Signed)
09/11/2015 - 09/12/2015  6:14 PM  PATIENT:  Hunter Santos  64 y.o. male  PRE-OPERATIVE DIAGNOSIS:  INFECTED LEFT TOTAL KNEE replacement  POST-OPERATIVE DIAGNOSIS:  INFECTED LEFT TOTAL KNEE replacement  PROCEDURE:  Procedure(s): IRRIGATION AND DEBRIDEMENT KNEE WITH POLY EXCHANGE LEFT TOTAL KNEE (Left)  SURGEON:  Surgeon(s) and Role:    * Paralee Cancel, MD - Primary  PHYSICIAN ASSISTANT: Danae Orleans, PA-C  ANESTHESIA:   spinal  EBL:  Total I/O In: 4196.3 [I.V.:3096.3; IV Piggyback:1100] Out: 2150 [Urine:2000; Blood:150]  BLOOD ADMINISTERED:none  DRAINS: none   LOCAL MEDICATIONS USED:  NONE  SPECIMEN:  Source of Specimen:  left knee joint  DISPOSITION OF SPECIMEN:  PATHOLOGY  COUNTS:  YES  TOURNIQUET:   Total Tourniquet Time Documented: Thigh (Left) - -49 minutes Total: Thigh (Left) - -49 minutes  807 DICTATION: .Other Dictation: Dictation Number [id number]  PLAN OF CARE: Admit to inpatient   PATIENT DISPOSITION:  PACU - hemodynamically stable.   Delay start of Pharmacological VTE agent (>24hrs) due to surgical blood loss or risk of bleeding: no

## 2015-09-12 NOTE — Care Management Note (Signed)
Case Management Note  Patient Details  Name: Hunter Santos MRN: [id number] Date of Birth: 01-15-1951  Subjective/Objective:              sepsis      Action/Plan:Date: September 12, 2015 Chart reviewed for concurrent status and case management needs. Will continue to follow patient for changes and needs: Velva Harman, BSN, RN, [place]   [phone]   Expected Discharge Date:                  Expected Discharge Plan:  Home/Self Care  In-House Referral:  NA  Discharge planning Services  CM Consult  Post Acute Care Choice:  NA Choice offered to:  NA  DME Arranged:    DME Agency:     HH Arranged:    HH Agency:     Status of Service:  In process, will continue to follow  Medicare Important Message Given:    Date Medicare IM Given:    Medicare IM give by:    Date Additional Medicare IM Given:    Additional Medicare Important Message give by:     If discussed at [place] of Stay Meetings, dates discussed:    Additional Comments:  Leeroy Cha, RN 09/12/2015, 10:15 AM

## 2015-09-13 ENCOUNTER — Inpatient Hospital Stay (HOSPITAL_COMMUNITY): Payer: 59

## 2015-09-13 ENCOUNTER — Encounter (HOSPITAL_COMMUNITY): Payer: Self-pay | Admitting: Orthopedic Surgery

## 2015-09-13 DIAGNOSIS — Y838 Other surgical procedures as the cause of abnormal reaction of the patient, or of later complication, without mention of misadventure at the time of the procedure: Secondary | ICD-10-CM

## 2015-09-13 DIAGNOSIS — T8454XA Infection and inflammatory reaction due to internal left knee prosthesis, initial encounter: Principal | ICD-10-CM

## 2015-09-13 DIAGNOSIS — M00262 Other streptococcal arthritis, left knee: Secondary | ICD-10-CM

## 2015-09-13 DIAGNOSIS — I514 Myocarditis, unspecified: Secondary | ICD-10-CM

## 2015-09-13 DIAGNOSIS — B951 Streptococcus, group B, as the cause of diseases classified elsewhere: Secondary | ICD-10-CM

## 2015-09-13 LAB — BASIC METABOLIC PANEL
ANION GAP: 10 (ref 5–15)
BUN: 22 mg/dL — ABNORMAL HIGH (ref 6–20)
CALCIUM: 8.4 mg/dL — AB (ref 8.9–10.3)
CO2: 21 mmol/L — ABNORMAL LOW (ref 22–32)
Chloride: 111 mmol/L (ref 101–111)
Creatinine, Ser: 0.77 mg/dL (ref 0.61–1.24)
GLUCOSE: 118 mg/dL — AB (ref 65–99)
POTASSIUM: 3.1 mmol/L — AB (ref 3.5–5.1)
SODIUM: 142 mmol/L (ref 135–145)

## 2015-09-13 LAB — HEMOGLOBIN A1C
Hgb A1c MFr Bld: 8.1 % — ABNORMAL HIGH (ref 4.8–5.6)
MEAN PLASMA GLUCOSE: 186 mg/dL

## 2015-09-13 LAB — CBC WITH DIFFERENTIAL/PLATELET
BASOS ABS: 0 10*3/uL (ref 0.0–0.1)
Basophils Relative: 0 %
EOS ABS: 0 10*3/uL (ref 0.0–0.7)
Eosinophils Relative: 0 %
HCT: 36.5 % — ABNORMAL LOW (ref 39.0–52.0)
Hemoglobin: 11.9 g/dL — ABNORMAL LOW (ref 13.0–17.0)
LYMPHS ABS: 1.1 10*3/uL (ref 0.7–4.0)
Lymphocytes Relative: 7 %
MCH: 28.4 pg (ref 26.0–34.0)
MCHC: 32.6 g/dL (ref 30.0–36.0)
MCV: 87.1 fL (ref 78.0–100.0)
MONO ABS: 0.9 10*3/uL (ref 0.1–1.0)
Monocytes Relative: 6 %
NEUTROS PCT: 87 %
Neutro Abs: 13.4 10*3/uL — ABNORMAL HIGH (ref 1.7–7.7)
PLATELETS: 229 10*3/uL (ref 150–400)
RBC: 4.19 MIL/uL — AB (ref 4.22–5.81)
RDW: 14.2 % (ref 11.5–15.5)
WBC: 15.4 10*3/uL — AB (ref 4.0–10.5)

## 2015-09-13 LAB — MAGNESIUM: MAGNESIUM: 2.2 mg/dL (ref 1.7–2.4)

## 2015-09-13 LAB — CK: CK TOTAL: 98 U/L (ref 49–397)

## 2015-09-13 LAB — PHOSPHORUS: PHOSPHORUS: 2.5 mg/dL (ref 2.5–4.6)

## 2015-09-13 LAB — GLUCOSE, CAPILLARY
GLUCOSE-CAPILLARY: 99 mg/dL (ref 65–99)
Glucose-Capillary: 113 mg/dL — ABNORMAL HIGH (ref 65–99)
Glucose-Capillary: 148 mg/dL — ABNORMAL HIGH (ref 65–99)

## 2015-09-13 MED ORDER — POTASSIUM CHLORIDE 10 MEQ/100ML IV SOLN
10.0000 meq | INTRAVENOUS | Status: AC
  Administered 2015-09-13 (×3): 10 meq via INTRAVENOUS
  Filled 2015-09-13 (×3): qty 100

## 2015-09-13 MED ORDER — DEXTROSE 5 % IV SOLN
2.0000 g | INTRAVENOUS | Status: DC
  Administered 2015-09-13 – 2015-09-17 (×5): 2 g via INTRAVENOUS
  Filled 2015-09-13 (×6): qty 2

## 2015-09-13 NOTE — Progress Notes (Signed)
  Echocardiogram 2D Echocardiogram has been performed.  Tresa Res 09/13/2015, 3:37 PM

## 2015-09-13 NOTE — Progress Notes (Addendum)
PROGRESS NOTE  Hunter Santos [id number] DOB: 10-07-1950 DOA: 09/11/2015 PCP: Dwan Bolt, MD  [name], 66 y.o. male, has a history of infection in the left total knee arthroplasty. S/p 6 weeks of IV antibiotics in 2015. In early February of 2015 he underwent revision of the total knee arthroplasty and reimplantation of a total knee arthroplasty after removal of antibiotic spacer.  And then about a week ago he started developed pain in the left knee. The knee started swelling. He had difficulty walking. He subsequently went to see his orthopedic doctor on Thursday or Friday. Apparently, they performed x-rays of the knee. No fractures were noted. The knee was noted to be swollen. Arthrocentesis was done and apparently raised concern for infection. So the patient was scheduled for irrigation of that joint on Monday. Went to OR yesterday for debridement    Assessment/Plan: Severe sepsis secondary to left knee infection:  -ortho (Dr. Alvan Dame) consulted by ER doc -Sepsis protocol has been initiated -s/p 4 L of normal saline -Lactic acid level has normalized -has aspiration of knee in office that grew: aspiration in the office that he had a beta-hemolytic strep  -vancomycin  -Blood cultures 2/2 gram + cocci -appreciate ID consult  Gram + bacteremia (2/26) -IV vanc  High anion gap metabolic acidosis: -resolved with IVF  history of diabetes mellitus type 2:  -HgbA1C -SSI -hold PO meds  Hypokalemia -replete   history of essential hypertension:  -was hypotensive: hold all of his antihypertensive medications.  Mild acute renal failure   -improved  Code Status: full Family Communication: patient/wife at bedside (2/27) Disposition Plan: ? tx to floor today or in AM   Consultants:  PCCM  Alvis Lemmings  Procedures:      HPI/Subjective: Hungry Sore shoulders (from tilling field)   Objective: Filed Vitals:   09/13/15 0600 09/13/15 0800  BP:  137/64   Pulse: 102   Temp:  97.7 F (36.5 C)  Resp: 25     Intake/Output Summary (Last 24 hours) at 09/13/15 0819 Last data filed at 09/13/15 0750  Gross per 24 hour  Intake 2676.25 ml  Output   3675 ml  Net -998.75 ml   Filed Weights   09/11/15 1053 09/11/15 1615  Weight: 127.007 kg (280 lb) 119.2 kg (262 lb 12.6 oz)    Exam:   General:  Awake, NAD  Cardiovascular: rrr  Respiratory: clear  Abdomen: +BS, soft  Musculoskeletal: left leg in brace  Data Reviewed: Basic Metabolic Panel:  Recent Labs Lab 09/11/15 1105 09/11/15 1430 09/11/15 1521 09/11/15 2029 09/12/15 0324 09/12/15 1454 09/13/15 0500  NA 133* 133*  --  139 138 140 142  K 2.7* 2.1*  --  2.6* 3.0* 3.1* 3.1*  CL 97* 103  --  107 107  --  111  CO2 13* 18*  --  18* 19*  --  21*  GLUCOSE 221* 142*  --  118* 79 121* 118*  BUN 30* 28*  --  25* 23*  --  22*  CREATININE 1.29* 1.26*  --  1.06 0.96  --  0.77  CALCIUM 9.2 8.1*  --  8.1* 7.9*  --  8.4*  MG  --   --  1.7  --  1.8  --  2.2  PHOS  --   --  1.0*  --  2.1*  --  2.5   Liver Function Tests:  Recent Labs Lab 09/11/15 1105 09/11/15 1521  AST 23 78*  ALT 20  35  ALKPHOS 87 70  BILITOT 2.4* 1.8*  PROT 6.9 5.4*  ALBUMIN 2.7* 2.2*   No results for input(s): LIPASE, AMYLASE in the last 168 hours. No results for input(s): AMMONIA in the last 168 hours. CBC:  Recent Labs Lab 09/11/15 1105 09/12/15 0324 09/12/15 1454 09/13/15 0500  WBC 5.7 13.8*  --  15.4*  NEUTROABS 5.2 11.8*  --  13.4*  HGB 15.2 12.0* 13.3 11.9*  HCT 44.9 35.8* 39.0 36.5*  MCV 87.2 87.3  --  87.1  PLT 222 192  --  229   Cardiac Enzymes: No results for input(s): CKTOTAL, CKMB, CKMBINDEX, TROPONINI in the last 168 hours. BNP (last 3 results) No results for input(s): BNP in the last 8760 hours.  ProBNP (last 3 results) No results for input(s): PROBNP in the last 8760 hours.  CBG:  Recent Labs Lab 09/12/15 1801 09/12/15 2153 09/13/15 0041 09/13/15 0455  09/13/15 0734  GLUCAP 134* 155* 148* 113* 99    Recent Results (from the past 240 hour(s))  Blood Culture (routine x 2)     Status: None (Preliminary result)   Collection Time: 09/11/15 11:00 AM  Result Value Ref Range Status   Specimen Description BLOOD RIGHT ANTECUBITAL  Final   Special Requests BOTTLES DRAWN AEROBIC AND ANAEROBIC 5CC  Final   Culture  Setup Time   Final    GRAM POSITIVE COCCI IN PAIRS AND CHAINS IN BOTH AEROBIC AND ANAEROBIC BOTTLES CRITICAL RESULT CALLED TO, READ BACK BY AND VERIFIED WITH: A CHAVZE,RN [id number] 0110 [place]    Culture   Final    NO GROWTH < 24 HOURS Performed at [facility]    Report Status PENDING  Incomplete  Blood Culture (routine x 2)     Status: None (Preliminary result)   Collection Time: 09/11/15 11:05 AM  Result Value Ref Range Status   Specimen Description BLOOD BLOOD RIGHT FOREARM  Final   Special Requests BOTTLES DRAWN AEROBIC AND ANAEROBIC 5CC  Final   Culture  Setup Time   Final    GRAM POSITIVE COCCI IN PAIRS AND CHAINS IN BOTH AEROBIC AND ANAEROBIC BOTTLES CRITICAL RESULT CALLED TO, READ BACK BY AND VERIFIED WITH: A CHAVZE,RN [id number] 0110 [place]    Culture   Final    NO GROWTH < 24 HOURS Performed at [facility]    Report Status PENDING  Incomplete  Urine culture     Status: None   Collection Time: 09/11/15  1:38 PM  Result Value Ref Range Status   Specimen Description URINE, CLEAN CATCH  Final   Special Requests NONE  Final   Culture   Final    MULTIPLE SPECIES PRESENT, SUGGEST RECOLLECTION Performed at [facility]    Report Status 09/12/2015 FINAL  Final  MRSA PCR Screening     Status: Abnormal   Collection Time: 09/11/15  4:28 PM  Result Value Ref Range Status   MRSA by PCR POSITIVE (A) NEGATIVE Final    Comment:        The GeneXpert MRSA Assay (FDA approved for NASAL specimens only), is one component of a comprehensive MRSA colonization surveillance program. It is not intended to  diagnose MRSA infection nor to guide or monitor treatment for MRSA infections. RESULT CALLED TO, READ BACK BY AND VERIFIED WITH: Q.MBEMENU,RN AT 2014 ON 09/11/15 BY W.SHEA   Body fluid culture     Status: None (Preliminary result)   Collection Time: 09/12/15  4:31 PM  Result Value Ref  Range Status   Specimen Description SYNOVIAL FLUID LEFT KNEE  Final   Special Requests PATIENT ON FOLLOWING VANC AND ZOSYN  Final   Gram Stain   Final    ABUNDANT WBC PRESENT,BOTH PMN AND MONONUCLEAR ABUNDANT GRAM POSITIVE COCCI IN PAIRS IN CHAINS Gram Stain Report Called to,Read Back By and Verified With: T PALEN AT 1815 ON 02.27.2017 BY NBROOKS     Culture PENDING  Incomplete   Report Status PENDING  Incomplete     Studies: Dg Chest Port 1 View  09/11/2015  CLINICAL DATA:  Acute fever and shortness of breath. EXAM: PORTABLE CHEST 1 VIEW COMPARISON:  10/20/2010 chest radiograph FINDINGS: The cardiomediastinal silhouette is unremarkable. There is no evidence of focal airspace disease, pulmonary edema, suspicious pulmonary nodule/mass, pleural effusion, or pneumothorax. No acute bony abnormalities are identified. IMPRESSION: No active disease. Electronically Signed   By: Margarette Canada M.D.   On: 09/11/2015 14:02    Scheduled Meds: . antiseptic oral rinse  7 mL Mouth Rinse BID  . Chlorhexidine Gluconate Cloth  6 each Topical Q0600  . insulin aspart  0-15 Units Subcutaneous 6 times per day  . mupirocin ointment  1 application Nasal BID  . oxychlorosene   Irrigation Once  . phosphorus  250 mg Oral TID  . potassium chloride  10 mEq Intravenous Q1 Hr x 3  . sodium chloride flush  10-40 mL Intracatheter Q12H  . sodium chloride flush  3 mL Intravenous Q12H  . vancomycin  1,000 mg Intravenous Q12H   Continuous Infusions:   Antibiotics Given (last 72 hours)    Date/Time Action Medication Dose Rate   09/11/15 2059 Given   [MAR Hold] piperacillin-tazobactam (ZOSYN) IVPB 3.375 g (MAR Hold since 09/12/15 1402)  3.375 g 12.5 mL/hr   09/12/15 0407 Given   [MAR Hold] piperacillin-tazobactam (ZOSYN) IVPB 3.375 g (MAR Hold since 09/12/15 1402) 3.375 g 12.5 mL/hr   09/12/15 0801 Given   vancomycin (VANCOCIN) 1,500 mg in sodium chloride 0.9 % 500 mL IVPB 1,500 mg 250 mL/hr   09/12/15 1146 Given   [MAR Hold] piperacillin-tazobactam (ZOSYN) IVPB 3.375 g (MAR Hold since 09/12/15 1402) 3.375 g 12.5 mL/hr   09/12/15 1146 Given   vancomycin (VANCOCIN) IVPB 1000 mg/200 mL premix 1,000 mg 200 mL/hr   09/12/15 1709 Given  [Sprikled directly into joint capsule]   vancomycin (VANCOCIN) powder 1,000 mg    09/13/15 0054 Given   vancomycin (VANCOCIN) IVPB 1000 mg/200 mL premix 1,000 mg 200 mL/hr      Principal Problem:   Infection of prosthetic left knee joint (HCC) Active Problems:   S/P left knee resection, abx spacer   DM (diabetes mellitus) (HCC)   S/P revision of total knee   Septic shock (HCC)   High anion gap metabolic acidosis   Acute renal failure (ARF) (HCC)   Hypokalemia   Severe sepsis (HCC)   Lactic acid acidosis   Streptococcal bacteremia    Time spent: 25 min    [place] [facility] Pager [phone]. If 7PM-7AM, please contact night-coverage at [email or web address], password [facility] 09/13/2015, 8:19 AM  LOS: 2 days

## 2015-09-13 NOTE — Progress Notes (Signed)
Patient ID: Hunter Santos, male   DOB: [birth year], 65 y.o.   MRN: [id number] Subjective: 1 Day Post-Op Procedure(s) (LRB): IRRIGATION AND DEBRIDEMENT KNEE WITH POLY EXCHANGE LEFT TOTAL KNEE (Left)    Patient reports pain as mild to moderate.  States that he is feeling better.  Wife and daughter in room.  Dr [name] had been by to consult and help mansge  Objective:   VITALS:   Filed Vitals:   09/13/15 1900 09/13/15 2000  BP:  142/69  Pulse: 95 101  Temp:    Resp: 22 24    Neurovascular intact Incision: dressing C/D/I  LABS  Recent Labs  09/11/15 1105 09/12/15 0324 09/12/15 1454 09/13/15 0500  HGB 15.2 12.0* 13.3 11.9*  HCT 44.9 35.8* 39.0 36.5*  WBC 5.7 13.8*  --  15.4*  PLT 222 192  --  229     Recent Labs  09/11/15 2029 09/12/15 0324 09/12/15 1454 09/13/15 0500  NA 139 138 140 142  K 2.6* 3.0* 3.1* 3.1*  BUN 25* 23*  --  22*  CREATININE 1.06 0.96  --  0.77  GLUCOSE 118* 79 121* 118*     Recent Labs  09/11/15 1521 09/11/15 1730  INR 1.22 1.32     Assessment/Plan: 1 Day Post-Op Procedure(s) (LRB): IRRIGATION AND DEBRIDEMENT KNEE WITH POLY EXCHANGE LEFT TOTAL KNEE (Left)   Up with therapy  Despite medical condition i want him to be up with therapy as tolerated to move knee to prevent stiffness and restore function in this setting of infection  Antibiotics per ID Will follow until D/C HHPT  HH RN for IV antibiotics RTC in 2 weeks

## 2015-09-13 NOTE — Progress Notes (Signed)
Patient ID: Hunter Santos, male   DOB: 1951/05/10, 65 y.o.   MRN: [id number]         [facility]    Date of Admission:  09/11/2015   Day 3 vancomycin          Principal Problem:   Infection of prosthetic left knee joint (HCC) Active Problems:   Septic shock (HCC)   Severe sepsis ([place])   Streptococcal bacteremia   S/P left knee resection, abx spacer   S/P revision of total knee   DM (diabetes mellitus) (HCC)   High anion gap metabolic acidosis   Acute renal failure (ARF) (HCC)   Hypokalemia   Lactic acid acidosis   . antiseptic oral rinse  7 mL Mouth Rinse BID  . Chlorhexidine Gluconate Cloth  6 each Topical Q0600  . insulin aspart  0-15 Units Subcutaneous 6 times per day  . mupirocin ointment  1 application Nasal BID  . oxychlorosene   Irrigation Once  . phosphorus  250 mg Oral TID  . sodium chloride flush  10-40 mL Intracatheter Q12H  . sodium chloride flush  3 mL Intravenous Q12H  . vancomycin  1,000 mg Intravenous Q12H    SUBJECTIVE: Hunter Santos states that is feeling much better than when he came into the hospital a few days ago. He tells me that he was doing very well and having no problems with his left prosthetic knee. However he recently spent a long day filling his garden and his neighbors garden. As he was putting his tiller away in the garage he had sudden onset of severe left knee pain and severe weakness. He had a near syncopal episode in the house. He was seen by Dr. Alvan Dame and fluid was aspirated. Cultures yielded beta-hemolytic strep. He was set up for scheduled surgery but became septic leading to admission 3 days ago. Yesterday he underwent incision and drainage of his left knee with poly-exchange. Synovial fluid examination revealed 367,920 white blood cells with 95% neutrophils. No crystals were seen. Operative Gram stain revealed gram-positive cocci in pairs and chains. Both admission blood cultures have grown group B  strep.  Review of Systems: Review of Systems  Constitutional: Positive for fever, chills and malaise/fatigue. Negative for weight loss and diaphoresis.  HENT: Negative for sore throat.   Respiratory: Negative for cough, sputum production and shortness of breath.   Cardiovascular: Negative for chest pain.  Gastrointestinal: Negative for nausea, vomiting and diarrhea.  Genitourinary: Negative for dysuria.  Musculoskeletal: Positive for joint pain. Negative for myalgias.  Skin: Negative for rash.  Neurological: Positive for weakness. Negative for headaches.    Past Medical History  Diagnosis Date  . Hypertension   . Diabetes mellitus   . Arthritis   . History of transfusion   . Sleep apnea     had surgery for deviated septum previously    Social History  Substance Use Topics  . Smoking status: Never Smoker   . Smokeless tobacco: Never Used  . Alcohol Use: No    History reviewed. No pertinent family history. No Known Allergies  OBJECTIVE: Filed Vitals:   09/13/15 0900 09/13/15 1000 09/13/15 1200 09/13/15 1400  BP:  131/70 128/66 118/66  Pulse: 97 94 96 95  Temp:   97.9 F (36.6 C)   TempSrc:      Resp: 21 22 23 20   Height:      Weight:      SpO2: 100% 100% 96% 92%   Body  mass index is 33.73 kg/(m^2).  Physical Exam  Constitutional: He is oriented to person, place, and time.  He is alert and comfortable watching television.  HENT:  Mouth/Throat: No oropharyngeal exudate.  Eyes: Conjunctivae are normal.  Cardiovascular: Normal rate and regular rhythm.   No murmur heard. Pulmonary/Chest: Breath sounds normal.  Abdominal: Soft. There is no tenderness.  Musculoskeletal:  His left leg is in a soft brace and Ace wrap.  Neurological: He is alert and oriented to person, place, and time.  Skin: No rash noted.  He has a new right arm PICC.  Psychiatric: Mood and affect normal.    Lab Results Lab Results  Component Value Date   WBC 15.4* 09/13/2015   HGB 11.9*  09/13/2015   HCT 36.5* 09/13/2015   MCV 87.1 09/13/2015   PLT 229 09/13/2015    Lab Results  Component Value Date   CREATININE 0.77 09/13/2015   BUN 22* 09/13/2015   NA 142 09/13/2015   K 3.1* 09/13/2015   CL 111 09/13/2015   CO2 21* 09/13/2015    Lab Results  Component Value Date   ALT 35 09/11/2015   AST 78* 09/11/2015   ALKPHOS 70 09/11/2015   BILITOT 1.8* 09/11/2015     Microbiology: Recent Results (from the past 240 hour(s))  Blood Culture (routine x 2)     Status: None (Preliminary result)   Collection Time: 09/11/15 11:00 AM  Result Value Ref Range Status   Specimen Description BLOOD RIGHT ANTECUBITAL  Final   Special Requests BOTTLES DRAWN AEROBIC AND ANAEROBIC 5CC  Final   Culture  Setup Time   Final    GRAM POSITIVE COCCI IN PAIRS AND CHAINS IN BOTH AEROBIC AND ANAEROBIC BOTTLES CRITICAL RESULT CALLED TO, READ BACK BY AND VERIFIED WITH: A CHAVZE,RN [id number] 0110 [place]    Culture   Final    GROUP B STREP(S.AGALACTIAE)ISOLATED SUSCEPTIBILITIES TO FOLLOW Performed at [facility]    Report Status PENDING  Incomplete  Blood Culture (routine x 2)     Status: None (Preliminary result)   Collection Time: 09/11/15 11:05 AM  Result Value Ref Range Status   Specimen Description BLOOD BLOOD RIGHT FOREARM  Final   Special Requests BOTTLES DRAWN AEROBIC AND ANAEROBIC 5CC  Final   Culture  Setup Time   Final    GRAM POSITIVE COCCI IN PAIRS AND CHAINS IN BOTH AEROBIC AND ANAEROBIC BOTTLES CRITICAL RESULT CALLED TO, READ BACK BY AND VERIFIED WITH: A CHAVZE,RN ID:[id number] 0110 [place]    Culture   Final    GROUP B STREP(S.AGALACTIAE)ISOLATED Performed at [facility]    Report Status PENDING  Incomplete  Urine culture     Status: None   Collection Time: 09/11/15  1:38 PM  Result Value Ref Range Status   Specimen Description URINE, CLEAN CATCH  Final   Special Requests NONE  Final   Culture   Final    MULTIPLE SPECIES PRESENT, SUGGEST  RECOLLECTION Performed at [facility]    Report Status 09/12/2015 FINAL  Final  MRSA PCR Screening     Status: Abnormal   Collection Time: 09/11/15  4:28 PM  Result Value Ref Range Status   MRSA by PCR POSITIVE (A) NEGATIVE Final    Comment:        The GeneXpert MRSA Assay (FDA approved for NASAL specimens only), is one component of a comprehensive MRSA colonization surveillance program. It is not intended to diagnose MRSA infection nor to guide or monitor  treatment for MRSA infections. RESULT CALLED TO, READ BACK BY AND VERIFIED WITH: Q.MBEMENU,RN AT 2014 ON 09/11/15 BY W.SHEA   Anaerobic culture     Status: None (Preliminary result)   Collection Time: 09/12/15  4:31 PM  Result Value Ref Range Status   Specimen Description SYNOVIAL LEFT KNEE  Final   Special Requests NONE  Final   Gram Stain   Final    NO ANAEROBES ISOLATED; CULTURE IN PROGRESS FOR 5 DAYS Performed at [facility]    Culture PENDING  Incomplete   Report Status PENDING  Incomplete  Body fluid culture     Status: None (Preliminary result)   Collection Time: 09/12/15  4:31 PM  Result Value Ref Range Status   Specimen Description SYNOVIAL FLUID LEFT KNEE  Final   Special Requests PATIENT ON FOLLOWING VANC AND ZOSYN  Final   Gram Stain   Final    ABUNDANT WBC PRESENT,BOTH PMN AND MONONUCLEAR ABUNDANT GRAM POSITIVE COCCI IN PAIRS IN CHAINS Gram Stain Report Called to,Read Back By and Verified With: T PALEN AT 1815 ON 02.27.2017 BY NBROOKS     Culture   Final    TOO YOUNG TO READ Performed at [facility]    Report Status PENDING  Incomplete     ASSESSMENT: He has group B streptococcal bacteremia complicated by acute infection of his left prosthetic knee. He has undergone surgical washout and poly-exchange. He will need 6 weeks of IV antibiotic therapy. I will treat him with high-dose, once daily ceftriaxone. This will need to be followed by several months of oral antibiotic therapy  in order to have a reliable chances of curing his infection and salvaging the joint.  PLAN: 1. Change vancomycin to ceftriaxone 2 g IV daily for 6 weeks through 10/22/2015 2. Check sedimentation rate and C-reactive protein 3. I will arrange follow-up in my clinic in 4-5 weeks  [name], MD [facility] [phone] pager   [phone] cell 09/13/2015, 3:24 PM

## 2015-09-14 ENCOUNTER — Inpatient Hospital Stay (HOSPITAL_COMMUNITY): Payer: 59

## 2015-09-14 DIAGNOSIS — B954 Other streptococcus as the cause of diseases classified elsewhere: Secondary | ICD-10-CM

## 2015-09-14 DIAGNOSIS — E876 Hypokalemia: Secondary | ICD-10-CM

## 2015-09-14 DIAGNOSIS — R7881 Bacteremia: Secondary | ICD-10-CM

## 2015-09-14 LAB — GLUCOSE, CAPILLARY
GLUCOSE-CAPILLARY: 136 mg/dL — AB (ref 65–99)
GLUCOSE-CAPILLARY: 140 mg/dL — AB (ref 65–99)
GLUCOSE-CAPILLARY: 151 mg/dL — AB (ref 65–99)
GLUCOSE-CAPILLARY: 156 mg/dL — AB (ref 65–99)
GLUCOSE-CAPILLARY: 188 mg/dL — AB (ref 65–99)
Glucose-Capillary: 157 mg/dL — ABNORMAL HIGH (ref 65–99)
Glucose-Capillary: 157 mg/dL — ABNORMAL HIGH (ref 65–99)
Glucose-Capillary: 169 mg/dL — ABNORMAL HIGH (ref 65–99)
Glucose-Capillary: 167 mg/dL — ABNORMAL HIGH (ref 65–99)

## 2015-09-14 LAB — CULTURE, BLOOD (ROUTINE X 2)

## 2015-09-14 LAB — CBC WITH DIFFERENTIAL/PLATELET
BASOS PCT: 0 %
Basophils Absolute: 0 10*3/uL (ref 0.0–0.1)
EOS PCT: 0 %
Eosinophils Absolute: 0 10*3/uL (ref 0.0–0.7)
HEMATOCRIT: 36.3 % — AB (ref 39.0–52.0)
HEMOGLOBIN: 12.2 g/dL — AB (ref 13.0–17.0)
Lymphocytes Relative: 7 %
Lymphs Abs: 1.3 10*3/uL (ref 0.7–4.0)
MCH: 29.4 pg (ref 26.0–34.0)
MCHC: 33.6 g/dL (ref 30.0–36.0)
MCV: 87.5 fL (ref 78.0–100.0)
MONOS PCT: 6 %
Monocytes Absolute: 1.2 10*3/uL — ABNORMAL HIGH (ref 0.1–1.0)
NEUTROS PCT: 87 %
Neutro Abs: 16.7 10*3/uL — ABNORMAL HIGH (ref 1.7–7.7)
Platelets: 326 10*3/uL (ref 150–400)
RBC: 4.15 MIL/uL — AB (ref 4.22–5.81)
RDW: 14.7 % (ref 11.5–15.5)
WBC: 19.2 10*3/uL — AB (ref 4.0–10.5)

## 2015-09-14 LAB — BASIC METABOLIC PANEL
Anion gap: 13 (ref 5–15)
BUN: 23 mg/dL — AB (ref 6–20)
CALCIUM: 8.4 mg/dL — AB (ref 8.9–10.3)
CO2: 21 mmol/L — ABNORMAL LOW (ref 22–32)
CREATININE: 0.74 mg/dL (ref 0.61–1.24)
Chloride: 108 mmol/L (ref 101–111)
GFR calc Af Amer: >60 mL/min (ref >60)
GLUCOSE: 156 mg/dL — AB (ref 65–99)
Potassium: 3 mmol/L — ABNORMAL LOW (ref 3.5–5.1)
Sodium: 142 mmol/L (ref 135–145)

## 2015-09-14 LAB — SEDIMENTATION RATE: Sed Rate: 76 mm/hr — ABNORMAL HIGH (ref 0–16)

## 2015-09-14 LAB — MAGNESIUM: Magnesium: 2 mg/dL (ref 1.7–2.4)

## 2015-09-14 LAB — PHOSPHORUS: PHOSPHORUS: 3.1 mg/dL (ref 2.5–4.6)

## 2015-09-14 LAB — C-REACTIVE PROTEIN: CRP: 14.6 mg/dL — AB (ref <1.0)

## 2015-09-14 MED ORDER — POTASSIUM CHLORIDE CRYS ER 20 MEQ PO TBCR
40.0000 meq | EXTENDED_RELEASE_TABLET | ORAL | Status: AC
  Administered 2015-09-14 (×3): 40 meq via ORAL
  Filled 2015-09-14 (×3): qty 2

## 2015-09-14 NOTE — Progress Notes (Signed)
PT Cancellation Note  Patient Details Name: Hunter Santos MRN: [id number] DOB: 31-Oct-1950   Cancelled Treatment:    Reason Eval/Treat Not Completed: Medical issues which prohibited therapy (patient  with noted L should edema bruising and inability to raise the arm as well as incr. Pain with attempts. Prior falls  without pain. Matt, PA aware. Marland Kitchen )   Claretha Cooper 09/14/2015, 10:48 AM Tresa Endo PT [phone]

## 2015-09-14 NOTE — Progress Notes (Addendum)
[id number] Davis,BSN,RN,CCM: [id number] [facility] made aware of iv abx need upon discharge.spoke with a Manuela Schwartz.

## 2015-09-14 NOTE — Progress Notes (Signed)
PROGRESS NOTE  Hunter Santos [id number] DOB: 04-Nov-1950 DOA: 09/11/2015 PCP: Hunter Bolt, MD  Hunter Santos, 65 y.o. male, has a history of infection in the left total knee arthroplasty. S/p 6 weeks of IV antibiotics in 2015. In early February of 2015 he underwent revision of the total knee arthroplasty and reimplantation of a total knee arthroplasty after removal of antibiotic spacer.  And then about a week ago he started developed pain in the left knee. The knee started swelling. He had difficulty walking. He subsequently went to see his orthopedic doctor on Thursday or Friday. Apparently, they performed x-rays of the knee. No fractures were noted. The knee was noted to be swollen. Arthrocentesis was done and apparently raised concern for infection, seen by orthopedic, he went for surgical low-salt and poly-exchange on 2/27, by ID regarding antibiotics recommendation.  Assessment/Plan:  Severe sepsis secondary to left knee infection/group B streptococcal bacteremia -Sepsis protocol has been initiated, significantly improved, lactic acid normalized, still has significant leukocytosis -Lactic acid level has normalized -has aspiration of knee in office that grew: aspiration in the office that he had a beta-hemolytic strep  -Blood cultures : Group B streptococcus agalactiae - Status post left knee IRRIGATION AND DEBRIDEMENT KNEE WITH POLY EXCHANGE LEFT TOTAL KNEE by Dr. Alvan Santos 2/27 - ID consult greatly appreciated, initially on IV vancomycin, transitioned to IV Rocephin on 2/28, and to continue for 6 week 004/02/2016  Group B streptococcal bacteremia - Can do to left knee infection, management per ID  High anion gap metabolic acidosis: -resolved with IVF  history of diabetes mellitus type 2:  -HgbA1C -SSI -hold PO meds  Hypokalemia -replete, check an a.m., magnesium within normal limit   history of essential hypertension:  -Initially hypotensive, blood pressure started  to increase, resume gradually home medication if remains elevated.  Mild acute renal failure   -Resolved  Code Status: full Family Communication: patient/wife at bedside (2/27) Disposition Plan: ? tx to floor today or in AM   Consultants:  PCCM  Ortho Dr. Alvan Santos  ID Dr. Megan Santos  Procedures: Status post left knee IRRIGATION AND DEBRIDEMENT KNEE WITH POLY EXCHANGE LEFT TOTAL KNEE by Dr. Alvan Santos 2/27    HPI/Subjective: Denies any fever or chills, no chest pain, abdominal pain nausea or vomiting   Objective: Filed Vitals:   09/14/15 1000 09/14/15 1100  BP: 158/79   Pulse: 87 86  Temp:    Resp: 25 20    Intake/Output Summary (Last 24 hours) at 09/14/15 1144 Last data filed at 09/14/15 0900  Gross per 24 hour  Intake   1210 ml  Output   1200 ml  Net     10 ml   Filed Weights   09/11/15 1053 09/11/15 1615  Weight: 127.007 kg (280 lb) 119.2 kg (262 lb 12.6 oz)    Exam:   General:  Awake, NAD  Cardiovascular: rrr, no murmur rubs or gallops  Respiratory: clear to auscultation bilaterally, good air entry  Abdomen: +BS, soft, nontender, nondistended  Musculoskeletal: left leg in brace  Data Reviewed: Basic Metabolic Panel:  Recent Labs Lab 09/11/15 1430 09/11/15 1521 09/11/15 2029 09/12/15 0324 09/12/15 1454 09/13/15 0500 09/14/15 0915  NA 133*  --  139 138 140 142 142  K 2.1*  --  2.6* 3.0* 3.1* 3.1* 3.0*  CL 103  --  107 107  --  111 108  CO2 18*  --  18* 19*  --  21* 21*  GLUCOSE 142*  --  118*  79 121* 118* 156*  BUN 28*  --  25* 23*  --  22* 23*  CREATININE 1.26*  --  1.06 0.96  --  0.77 0.74  CALCIUM 8.1*  --  8.1* 7.9*  --  8.4* 8.4*  MG  --  1.7  --  1.8  --  2.2 2.0  PHOS  --  1.0*  --  2.1*  --  2.5 3.1   Liver Function Tests:  Recent Labs Lab 09/11/15 1105 09/11/15 1521  AST 23 78*  ALT 20 35  ALKPHOS 87 70  BILITOT 2.4* 1.8*  PROT 6.9 5.4*  ALBUMIN 2.7* 2.2*   No results for input(s): LIPASE, AMYLASE in the last 168  hours. No results for input(s): AMMONIA in the last 168 hours. CBC:  Recent Labs Lab 09/11/15 1105 09/12/15 0324 09/12/15 1454 09/13/15 0500 09/14/15 0552  WBC 5.7 13.8*  --  15.4* 19.2*  NEUTROABS 5.2 11.8*  --  13.4* 16.7*  HGB 15.2 12.0* 13.3 11.9* 12.2*  HCT 44.9 35.8* 39.0 36.5* 36.3*  MCV 87.2 87.3  --  87.1 87.5  PLT 222 192  --  229 326   Cardiac Enzymes:  Recent Labs Lab 09/13/15 0513  CKTOTAL 98   BNP (last 3 results) No results for input(s): BNP in the last 8760 hours.  ProBNP (last 3 results) No results for input(s): PROBNP in the last 8760 hours.  CBG:  Recent Labs Lab 09/13/15 0455 09/13/15 0734 09/13/15 2342 09/14/15 0446 09/14/15 0853  GLUCAP 113* 99 188* 151* 169*    Recent Results (from the past 240 hour(s))  Blood Culture (routine x 2)     Status: None   Collection Time: 09/11/15 11:00 AM  Result Value Ref Range Status   Specimen Description BLOOD RIGHT ANTECUBITAL  Final   Special Requests BOTTLES DRAWN AEROBIC AND ANAEROBIC 5CC  Final   Culture  Setup Time   Final    GRAM POSITIVE COCCI IN PAIRS AND CHAINS IN BOTH AEROBIC AND ANAEROBIC BOTTLES CRITICAL RESULT CALLED TO, READ BACK BY AND VERIFIED WITH: A CHAVZE,RN [id number] [place]    Culture   Final    STREPTOCOCCUS AGALACTIAE Performed at [facility]    Report Status 09/14/2015 FINAL  Final   Organism ID, Bacteria STREPTOCOCCUS AGALACTIAE  Final      Susceptibility   Streptococcus agalactiae - MIC*    CLINDAMYCIN <=0.25 SENSITIVE Sensitive     AMPICILLIN <=0.25 SENSITIVE Sensitive     ERYTHROMYCIN 2 RESISTANT Resistant     VANCOMYCIN 0.5 SENSITIVE Sensitive     CEFTRIAXONE <=0.12 SENSITIVE Sensitive     LEVOFLOXACIN 2 SENSITIVE Sensitive     * STREPTOCOCCUS AGALACTIAE  Blood Culture (routine x 2)     Status: None   Collection Time: 09/11/15 11:05 AM  Result Value Ref Range Status   Specimen Description BLOOD BLOOD RIGHT FOREARM  Final   Special Requests BOTTLES  DRAWN AEROBIC AND ANAEROBIC 5CC  Final   Culture  Setup Time   Final    GRAM POSITIVE COCCI IN PAIRS AND CHAINS IN BOTH AEROBIC AND ANAEROBIC BOTTLES CRITICAL RESULT CALLED TO, READ BACK BY AND VERIFIED WITH: A CHAVZE,RN ID:[id number] 0110 [place]    Culture   Final    GROUP B STREP(S.AGALACTIAE)ISOLATED SUSCEPTIBILITIES PERFORMED ON PREVIOUS CULTURE WITHIN THE LAST 5 DAYS. Performed at [facility]    Report Status 09/14/2015 FINAL  Final  Urine culture     Status: None   Collection  Time: 09/11/15  1:38 PM  Result Value Ref Range Status   Specimen Description URINE, CLEAN CATCH  Final   Special Requests NONE  Final   Culture   Final    MULTIPLE SPECIES PRESENT, SUGGEST RECOLLECTION Performed at [facility]    Report Status 09/12/2015 FINAL  Final  MRSA PCR Screening     Status: Abnormal   Collection Time: 09/11/15  4:28 PM  Result Value Ref Range Status   MRSA by PCR POSITIVE (A) NEGATIVE Final    Comment:        The GeneXpert MRSA Assay (FDA approved for NASAL specimens only), is one component of a comprehensive MRSA colonization surveillance program. It is not intended to diagnose MRSA infection nor to guide or monitor treatment for MRSA infections. RESULT CALLED TO, READ BACK BY AND VERIFIED WITH: [email or web address],RN AT 2014 ON 09/11/15 BY W.SHEA   Anaerobic culture     Status: None (Preliminary result)   Collection Time: 09/12/15  4:31 PM  Result Value Ref Range Status   Specimen Description SYNOVIAL LEFT KNEE  Final   Special Requests NONE  Final   Gram Stain   Final    NO ANAEROBES ISOLATED; CULTURE IN PROGRESS FOR 5 DAYS Performed at [facility]    Culture PENDING  Incomplete   Report Status PENDING  Incomplete  Body fluid culture     Status: None (Preliminary result)   Collection Time: 09/12/15  4:31 PM  Result Value Ref Range Status   Specimen Description SYNOVIAL FLUID LEFT KNEE  Final   Special Requests PATIENT ON FOLLOWING VANC AND ZOSYN   Final   Gram Stain   Final    ABUNDANT WBC PRESENT,BOTH PMN AND MONONUCLEAR ABUNDANT GRAM POSITIVE COCCI IN PAIRS IN CHAINS Gram Stain Report Called to,Read Back By and Verified With: T PALEN AT 1815 ON 02.27.2017 BY NBROOKS     Culture   Final    TOO YOUNG TO READ Performed at [facility]    Report Status PENDING  Incomplete     Studies: No results found.  Scheduled Meds: . antiseptic oral rinse  7 mL Mouth Rinse BID  . cefTRIAXone (ROCEPHIN)  IV  2 g Intravenous Q24H  . Chlorhexidine Gluconate Cloth  6 each Topical Q0600  . insulin aspart  0-15 Units Subcutaneous 6 times per day  . mupirocin ointment  1 application Nasal BID  . oxychlorosene   Irrigation Once  . phosphorus  250 mg Oral TID  . potassium chloride  40 mEq Oral Q4H  . sodium chloride flush  10-40 mL Intracatheter Q12H  . sodium chloride flush  3 mL Intravenous Q12H   Continuous Infusions:   Antibiotics Given (last 72 hours)    Date/Time Action Medication Dose Rate   09/11/15 2059 Given   [MAR Hold] piperacillin-tazobactam (ZOSYN) IVPB 3.375 g (MAR Hold since 09/12/15 1402) 3.375 g 12.5 mL/hr   09/12/15 0407 Given   [MAR Hold] piperacillin-tazobactam (ZOSYN) IVPB 3.375 g (MAR Hold since 09/12/15 1402) 3.375 g 12.5 mL/hr   09/12/15 0801 Given   vancomycin (VANCOCIN) 1,500 mg in sodium chloride 0.9 % 500 mL IVPB 1,500 mg 250 mL/hr   09/12/15 1146 Given   [MAR Hold] piperacillin-tazobactam (ZOSYN) IVPB 3.375 g (MAR Hold since 09/12/15 1402) 3.375 g 12.5 mL/hr   09/12/15 1146 Given   vancomycin (VANCOCIN) IVPB 1000 mg/200 mL premix 1,000 mg 200 mL/hr   09/12/15 1709 Given  [Sprikled directly into joint capsule]  vancomycin (VANCOCIN) powder 1,000 mg    09/13/15 0054 Given   vancomycin (VANCOCIN) IVPB 1000 mg/200 mL premix 1,000 mg 200 mL/hr   09/13/15 1158 Given   vancomycin (VANCOCIN) IVPB 1000 mg/200 mL premix 1,000 mg 200 mL/hr   09/13/15 1630 Given  [manually verified]   cefTRIAXone (ROCEPHIN)  2 g in dextrose 5 % 50 mL IVPB 2 g 100 mL/hr      Principal Problem:   Infection of prosthetic left knee joint (HCC) Active Problems:   S/P left knee resection, abx spacer   DM (diabetes mellitus) (HCC)   S/P revision of total knee   Septic shock (HCC)   High anion gap metabolic acidosis   Acute renal failure (ARF) (HCC)   Hypokalemia   Severe sepsis (HCC)   Lactic acid acidosis   Streptococcal bacteremia    Time spent: 25 min    [facility], Makayli Bracken  [facility] Pager [phone]. If 7PM-7AM, please contact night-coverage at [email or web address], password [facility] 09/14/2015, 11:44 AM  LOS: 3 days

## 2015-09-14 NOTE — Progress Notes (Signed)
     Subjective: 2 Days Post-Op Procedure(s) (LRB): IRRIGATION AND DEBRIDEMENT KNEE WITH POLY EXCHANGE LEFT TOTAL KNEE (Left)   Patient reports pain as mild, controlled with medication. C/o shoulder pain L>R, with swelling of the left shoulder.  Plan on getting a x-ray of the left shoulder, especially with  his recent falls to make sure that there is no acute fracture.    Objective:   VITALS:   Filed Vitals:   09/14/15 0800 09/14/15 0900  BP: 136/73   Pulse: 94 89  Temp:  98.9 F (37.2 C)  Resp: 24 22    Dorsiflexion/Plantar flexion intact Incision: dressing C/D/I Compartment soft  LABS  Recent Labs  09/12/15 0324 09/12/15 1454 09/13/15 0500 09/14/15 0552  HGB 12.0* 13.3 11.9* 12.2*  HCT 35.8* 39.0 36.5* 36.3*  WBC 13.8*  --  15.4* 19.2*  PLT 192  --  229 326     Recent Labs  09/11/15 2029 09/12/15 0324 09/12/15 1454 09/13/15 0500  NA 139 138 140 142  K 2.6* 3.0* 3.1* 3.1*  BUN 25* 23*  --  22*  CREATININE 1.06 0.96  --  0.77  GLUCOSE 118* 79 121* 118*     Assessment/Plan: 2 Days Post-Op Procedure(s) (LRB): IRRIGATION AND DEBRIDEMENT KNEE WITH POLY EXCHANGE LEFT TOTAL KNEE (Left)  Up with therapy Discharge home with home health eventually, when ready Follow [facility]. Follow up with OLIN,Andree Golphin D in 2 weeks.  Contact information:  [facility] [address] [place]   PAC  09/14/2015, 9:43 AM

## 2015-09-14 NOTE — Evaluation (Signed)
Physical Therapy Evaluation Patient Details Name: Hunter Santos MRN: [id number] DOB: [birth year] Today's Date: 09/14/2015   History of Present Illness  Hunter Santos is a 65 y.o. male with a past medical history of diabetes on multiple drugs, hypertension, previous history of left knee prosthesis infection who developed L knee swelling , pain, inability to ambulate and 3 falls at home. Seen by Dr. Alvan Dame and scheduled for I and D 09/12/15. Unfortunaley falls  prompted EMS to bring to ED.2/26/117.    In the emergency department, patient was found to be tachycardic, hypotensive, tachypneic. Lactic acid is 3.5. Patient has high anion gap metabolic acidosis. He is hypokalemic. He appears to have severe sepsis with possible septic shock.   Clinical Impression  The patient presents with  SIGNIFICANT DYSFUNCTION OF THE SHOULDERS WITH INABILITY TO RAISE THE ARMS. THE LEFT IS EDEMATOUS AND TENDER TO PALPATE. THE PATIENT IS BARELY ABLE TO BRING A CUP/STRAW TO HIS MOUTH. Patient reports 3 falls PTA, has facial and ear scrapes and bruises. Appears bruising over the L shoulder.THE  Pt was admitted with above diagnosis. Pt currently with functional limitations due to the deficits listed below (see PT Problem List). Pt will benefit from skilled PT to increase their independence and safety with mobility to allow discharge to the venue listed below.   PATIENT WAS INDEPENDENT AND PLOWING PTA.    Follow Up Recommendations SNF;Supervision/Assistance - 24 hour    Equipment Recommendations  Wheelchair (measurements PT) (TBD)    Recommendations for Other Services       Precautions / Restrictions Precautions Precautions: Knee Required Braces or Orthoses: Knee Immobilizer - Left Knee Immobilizer - Left: On when out of bed or walking (PT ordered a  KI for safety for standing due to poor UE function.)      Mobility  Bed Mobility Overal bed mobility: Needs Assistance;+2 for physical assistance;+ 2 for  safety/equipment             General bed mobility comments: Due to decreased ability to  functionally use either UE due to shpoulder dysfunction, elected to  place patient in bed/chair position. Patient tolerated upright at near 90* and legs down. Assisted patient to lean forward. He was able to work each arm forward by finger walking his fingers across his legs toward the knees and back. Unable to lift either  Arm  gainst gravity. The  patient reports 3 falls PTA  Transfers                    Ambulation/Gait                Stairs            Wheelchair Mobility    Modified Rankin (Stroke Patients Only)       Balance Overall balance assessment: Needs assistance;History of Falls Sitting-balance support: No upper extremity supported;Feet supported   Sitting balance - Comments: in bed  /chair position was able to lean forward and shift to L/R                                     Pertinent Vitals/Pain Pain Assessment: 0-10 Pain Location: patient has multiple pain sites:L knee 4, R shoulder 4 , L shoulder 7 Pain Descriptors / Indicators: Tightness;Discomfort Pain Intervention(s): Limited activity within patient's tolerance;Premedicated before session;Repositioned    Home Living Family/patient expects to be discharged to:: Private residence  Living Arrangements: Spouse/significant other Available Help at Discharge: Family Type of Home: House Home Access: Stairs to enter   Technical brewer of Steps: 3   Home Equipment: Walker - 2 wheels;Crutches      Prior Function Level of Independence: Independent               Hand Dominance        Extremity/Trunk Assessment   Upper Extremity Assessment: RUE deficits/detail;LUE deficits/detail RUE Deficits / Details: hand and elb grossly WFL, shoulder elevation markedly decreased for active movement, barely get hjand to moth, assisted at elbow to  reach the face.     LUE Deficits /  Details: significant limits  for forward flexiona dn abduction, no active movement ag gravity. NOTED MARKED SWELLING OF THE SHOULDER ANT AND LATERAL.   Lower Extremity Assessment: RLE deficits/detail;LLE deficits/detail RLE Deficits / Details: grossly wfl in bed, did not stand, ankle with bony abnormality, malleoli are enlarged. LLE Deficits / Details: knee extension 3/5  Cervical / Trunk Assessment: Normal;Other exceptions  Communication   Communication: No difficulties  Cognition Arousal/Alertness: Awake/alert Behavior During Therapy: WFL for tasks assessed/performed Overall Cognitive Status: Within Functional Limits for tasks assessed                      General Comments      Exercises Total Joint Exercises Ankle Circles/Pumps: AROM;Both;10 reps;Seated Long Arc Quad: AROM;Both;10 reps;Seated      Assessment/Plan    PT Assessment Patient needs continued PT services  PT Diagnosis Difficulty walking;Generalized weakness;Acute pain   PT Problem List Decreased strength;Decreased range of motion;Decreased activity tolerance;Decreased balance;Decreased mobility;Decreased knowledge of precautions;Decreased safety awareness;Decreased knowledge of use of DME;Pain  PT Treatment Interventions DME instruction;Gait training;Functional mobility training;Therapeutic activities;Therapeutic exercise;Patient/family education;Wheelchair mobility training   PT Goals (Current goals can be found in the Care Plan section) Acute Rehab PT Goals Patient Stated Goal: to walk PT Goal Formulation: With patient/family Time For Goal Achievement: 09/28/15 Potential to Achieve Goals: Good    Frequency 7X/week   Barriers to discharge Decreased caregiver support      Co-evaluation               End of Session   Activity Tolerance: Patient tolerated treatment well Patient left: in bed;with call bell/phone within reach;with bed alarm set Nurse Communication: Mobility status;Need for  lift equipment         Time: [id number] PT Time Calculation (min) (ACUTE ONLY): 50 min   Charges:   PT Evaluation $PT Eval Moderate Complexity: 1 Procedure PT Treatments $Therapeutic Exercise: 8-22 mins $Therapeutic Activity: 8-22 mins   PT G Codes:        Claretha Cooper 09/14/2015, 5:42 PM

## 2015-09-15 LAB — BODY FLUID CULTURE

## 2015-09-15 LAB — CBC WITH DIFFERENTIAL/PLATELET
BASOS ABS: 0 10*3/uL (ref 0.0–0.1)
Basophils Relative: 0 %
EOS ABS: 0.2 10*3/uL (ref 0.0–0.7)
EOS PCT: 1 %
HCT: 36.5 % — ABNORMAL LOW (ref 39.0–52.0)
Hemoglobin: 12 g/dL — ABNORMAL LOW (ref 13.0–17.0)
LYMPHS ABS: 1.7 10*3/uL (ref 0.7–4.0)
Lymphocytes Relative: 10 %
MCH: 29 pg (ref 26.0–34.0)
MCHC: 32.9 g/dL (ref 30.0–36.0)
MCV: 88.2 fL (ref 78.0–100.0)
MONO ABS: 1.4 10*3/uL — AB (ref 0.1–1.0)
Monocytes Relative: 8 %
NEUTROS PCT: 81 %
Neutro Abs: 13.8 10*3/uL — ABNORMAL HIGH (ref 1.7–7.7)
PLATELETS: 403 10*3/uL — AB (ref 150–400)
RBC: 4.14 MIL/uL — AB (ref 4.22–5.81)
RDW: 14.5 % (ref 11.5–15.5)
WBC: 17.1 10*3/uL — AB (ref 4.0–10.5)

## 2015-09-15 LAB — BASIC METABOLIC PANEL
ANION GAP: 10 (ref 5–15)
BUN: 18 mg/dL (ref 6–20)
CALCIUM: 8.2 mg/dL — AB (ref 8.9–10.3)
CO2: 23 mmol/L (ref 22–32)
Chloride: 110 mmol/L (ref 101–111)
Creatinine, Ser: 0.64 mg/dL (ref 0.61–1.24)
GLUCOSE: 138 mg/dL — AB (ref 65–99)
POTASSIUM: 3.8 mmol/L (ref 3.5–5.1)
SODIUM: 143 mmol/L (ref 135–145)

## 2015-09-15 LAB — GLUCOSE, CAPILLARY
GLUCOSE-CAPILLARY: 129 mg/dL — AB (ref 65–99)
GLUCOSE-CAPILLARY: 133 mg/dL — AB (ref 65–99)
GLUCOSE-CAPILLARY: 201 mg/dL — AB (ref 65–99)
Glucose-Capillary: 135 mg/dL — ABNORMAL HIGH (ref 65–99)
Glucose-Capillary: 184 mg/dL — ABNORMAL HIGH (ref 65–99)
Glucose-Capillary: 173 mg/dL — ABNORMAL HIGH (ref 65–99)

## 2015-09-15 MED ORDER — METHOCARBAMOL 500 MG PO TABS
500.0000 mg | ORAL_TABLET | Freq: Four times a day (QID) | ORAL | Status: DC | PRN
  Administered 2015-09-15 – 2015-09-16 (×4): 500 mg via ORAL
  Filled 2015-09-15 (×4): qty 1

## 2015-09-15 NOTE — Progress Notes (Signed)
PROGRESS NOTE  Hunter Santos [id number] DOB: [birth year] DOA: 09/11/2015 PCP: Dwan Bolt, MD  Hunter Santos, 65 y.o. male, has a history of infection in the left total knee arthroplasty. S/p 6 weeks of IV antibiotics in 2015. In early February of 2015 he underwent revision of the total knee arthroplasty and reimplantation of a total knee arthroplasty after removal of antibiotic spacer.  And then about a week ago he started developed pain in the left knee. The knee started swelling. He had difficulty walking. He subsequently went to see his orthopedic doctor on Thursday or Friday. Apparently, they performed x-rays of the knee. No fractures were noted. The knee was noted to be swollen. Arthrocentesis was done and apparently raised concern for infection, seen by orthopedic, he went for surgical low-salt and poly-exchange on 2/27, by ID regarding antibiotics recommendation.  Assessment/Plan:  Severe sepsis secondary to left knee infection/group B streptococcal bacteremia -Sepsis protocol has been initiated, significantly improved, lactic acid normalized, still has significant leukocytosis -Lactic acid level has normalized -has aspiration of knee in office that grew: aspiration in the office that he had a beta-hemolytic strep  -Blood cultures : Group B streptococcus agalactiae - Status post left knee IRRIGATION AND DEBRIDEMENT KNEE WITH POLY EXCHANGE LEFT TOTAL KNEE by Dr. Alvan Dame 2/27 - ID consult greatly appreciated, initially on IV vancomycin, transitioned to IV Rocephin on 2/28, and to continue for 6 week 10/22/2015 - Leukocytosis trending down, CRP remains significantly elevated at 14.6  Group B streptococcal bacteremia - Can do to left knee infection, management per ID  High anion gap metabolic acidosis: -resolved with IVF  Left shoulder pain - xray was significant for osteoarthritis, bursitis, and chronic intercostal tear, and tinea with OT  history of diabetes mellitus  type 2:  -HgbA1C -SSI -hold PO meds  Hypokalemia -replete, check an a.m., magnesium within normal limit   history of essential hypertension:  -Initially hypotensive, blood pressure started to increase, resume gradually home medication if remains elevated.  Mild acute renal failure   -Resolved  Code Status: full Family Communication: Discussed with patient Disposition Plan: will need SNF placement, but patient wants to go home   Consultants:  PCCM  Ortho Dr. Alvan Dame  ID Dr. Megan Salon  Procedures: Status post left knee IRRIGATION AND DEBRIDEMENT KNEE WITH POLY EXCHANGE LEFT TOTAL KNEE by Dr. Alvan Dame 2/27    HPI/Subjective: Denies any fever or chills, no chest pain, abdominal pain nausea or vomiting   Objective: Filed Vitals:   09/15/15 0456 09/15/15 1403  BP: 148/83 139/78  Pulse: 88 96  Temp: 98.3 F (36.8 C) 98.6 F (37 C)  Resp: 18 18    Intake/Output Summary (Last 24 hours) at 09/15/15 1720 Last data filed at 09/15/15 1404  Gross per 24 hour  Intake   1100 ml  Output   1750 ml  Net   -650 ml   Filed Weights   09/11/15 1053 09/11/15 1615  Weight: 127.007 kg (280 lb) 119.2 kg (262 lb 12.6 oz)    Exam:   General:  Awake, NAD  Cardiovascular: rrr, no murmur rubs or gallops  Respiratory: clear to auscultation bilaterally, good air entry  Abdomen: +BS, soft, nontender, nondistended  Musculoskeletal: left leg in brace  Data Reviewed: Basic Metabolic Panel:  Recent Labs Lab 09/11/15 1521 09/11/15 2029 09/12/15 0324 09/12/15 1454 09/13/15 0500 09/14/15 0915 09/15/15 0430  NA  --  139 138 140 142 142 143  K  --  2.6* 3.0* 3.1* 3.1* 3.0*  3.8  CL  --  107 107  --  111 108 110  CO2  --  18* 19*  --  21* 21* 23  GLUCOSE  --  118* 79 121* 118* 156* 138*  BUN  --  25* 23*  --  22* 23* 18  CREATININE  --  1.06 0.96  --  0.77 0.74 0.64  CALCIUM  --  8.1* 7.9*  --  8.4* 8.4* 8.2*  MG 1.7  --  1.8  --  2.2 2.0  --   PHOS 1.0*  --  2.1*  --  2.5 3.1   --    Liver Function Tests:  Recent Labs Lab 09/11/15 1105 09/11/15 1521  AST 23 78*  ALT 20 35  ALKPHOS 87 70  BILITOT 2.4* 1.8*  PROT 6.9 5.4*  ALBUMIN 2.7* 2.2*   No results for input(s): LIPASE, AMYLASE in the last 168 hours. No results for input(s): AMMONIA in the last 168 hours. CBC:  Recent Labs Lab 09/11/15 1105 09/12/15 0324 09/12/15 1454 09/13/15 0500 09/14/15 0552 09/15/15 0430  WBC 5.7 13.8*  --  15.4* 19.2* 17.1*  NEUTROABS 5.2 11.8*  --  13.4* 16.7* 13.8*  HGB 15.2 12.0* 13.3 11.9* 12.2* 12.0*  HCT 44.9 35.8* 39.0 36.5* 36.3* 36.5*  MCV 87.2 87.3  --  87.1 87.5 88.2  PLT 222 192  --  229 326 403*   Cardiac Enzymes:  Recent Labs Lab 09/13/15 0513  CKTOTAL 98   BNP (last 3 results) No results for input(s): BNP in the last 8760 hours.  ProBNP (last 3 results) No results for input(s): PROBNP in the last 8760 hours.  CBG:  Recent Labs Lab 09/15/15 0045 09/15/15 0445 09/15/15 0747 09/15/15 1133 09/15/15 1646  GLUCAP 133* 129* 135* 184* 201*    Recent Results (from the past 240 hour(s))  Blood Culture (routine x 2)     Status: None   Collection Time: 09/11/15 11:00 AM  Result Value Ref Range Status   Specimen Description BLOOD RIGHT ANTECUBITAL  Final   Special Requests BOTTLES DRAWN AEROBIC AND ANAEROBIC 5CC  Final   Culture  Setup Time   Final    GRAM POSITIVE COCCI IN PAIRS AND CHAINS IN BOTH AEROBIC AND ANAEROBIC BOTTLES CRITICAL RESULT CALLED TO, READ BACK BY AND VERIFIED WITH: A CHAVZE,RN [id number] [place]    Culture   Final    STREPTOCOCCUS AGALACTIAE Performed at [facility]    Report Status 09/14/2015 FINAL  Final   Organism ID, Bacteria STREPTOCOCCUS AGALACTIAE  Final      Susceptibility   Streptococcus agalactiae - MIC*    CLINDAMYCIN <=0.25 SENSITIVE Sensitive     AMPICILLIN <=0.25 SENSITIVE Sensitive     ERYTHROMYCIN 2 RESISTANT Resistant     VANCOMYCIN 0.5 SENSITIVE Sensitive     CEFTRIAXONE <=0.12  SENSITIVE Sensitive     LEVOFLOXACIN 2 SENSITIVE Sensitive     * STREPTOCOCCUS AGALACTIAE  Blood Culture (routine x 2)     Status: None   Collection Time: 09/11/15 11:05 AM  Result Value Ref Range Status   Specimen Description BLOOD BLOOD RIGHT FOREARM  Final   Special Requests BOTTLES DRAWN AEROBIC AND ANAEROBIC 5CC  Final   Culture  Setup Time   Final    GRAM POSITIVE COCCI IN PAIRS AND CHAINS IN BOTH AEROBIC AND ANAEROBIC BOTTLES CRITICAL RESULT CALLED TO, READ BACK BY AND VERIFIED WITH: A CHAVZE,RN [id number] [place]    Culture   Final  GROUP B STREP(S.AGALACTIAE)ISOLATED SUSCEPTIBILITIES PERFORMED ON PREVIOUS CULTURE WITHIN THE LAST 5 DAYS. Performed at [facility]    Report Status 09/14/2015 FINAL  Final  Urine culture     Status: None   Collection Time: 09/11/15  1:38 PM  Result Value Ref Range Status   Specimen Description URINE, CLEAN CATCH  Final   Special Requests NONE  Final   Culture   Final    MULTIPLE SPECIES PRESENT, SUGGEST RECOLLECTION Performed at [facility]    Report Status 09/12/2015 FINAL  Final  MRSA PCR Screening     Status: Abnormal   Collection Time: 09/11/15  4:28 PM  Result Value Ref Range Status   MRSA by PCR POSITIVE (A) NEGATIVE Final    Comment:        The GeneXpert MRSA Assay (FDA approved for NASAL specimens only), is one component of a comprehensive MRSA colonization surveillance program. It is not intended to diagnose MRSA infection nor to guide or monitor treatment for MRSA infections. RESULT CALLED TO, READ BACK BY AND VERIFIED WITH: Q.[email or web address],RN AT 2014 ON 09/11/15 BY W.SHEA   Anaerobic culture     Status: None (Preliminary result)   Collection Time: 09/12/15  4:31 PM  Result Value Ref Range Status   Specimen Description SYNOVIAL LEFT KNEE  Final   Special Requests NONE  Final   Gram Stain   Final    ABUNDANT WBC PRESENT,BOTH PMN AND MONONUCLEAR ABUNDANT GRAM POSITIVE COCCI IN PAIRS IN CHAINS    Culture    Final    NO ANAEROBES ISOLATED; CULTURE IN PROGRESS FOR 5 DAYS Performed at [facility]    Report Status PENDING  Incomplete  Body fluid culture     Status: None   Collection Time: 09/12/15  4:31 PM  Result Value Ref Range Status   Specimen Description SYNOVIAL FLUID LEFT KNEE  Final   Special Requests PATIENT ON FOLLOWING VANC AND ZOSYN  Final   Gram Stain   Final    ABUNDANT WBC PRESENT,BOTH PMN AND MONONUCLEAR ABUNDANT GRAM POSITIVE COCCI IN PAIRS IN CHAINS Gram Stain Report Called to,Read Back By and Verified With: T PALEN AT 1815 ON 02.27.2017 BY NBROOKS     Culture   Final    GROUP B STREP(S.AGALACTIAE)ISOLATED Performed at [facility]    Report Status 09/15/2015 FINAL  Final   Organism ID, Bacteria GROUP B STREP(S.AGALACTIAE)ISOLATED  Final      Susceptibility   Group b strep(s.agalactiae)isolated - MIC*    CLINDAMYCIN <=0.25 SENSITIVE Sensitive     AMPICILLIN <=0.25 SENSITIVE Sensitive     ERYTHROMYCIN 2 RESISTANT Resistant     VANCOMYCIN 0.5 SENSITIVE Sensitive     CEFTRIAXONE <=0.12 SENSITIVE Sensitive     LEVOFLOXACIN 1 SENSITIVE Sensitive     * GROUP B STREP(S.AGALACTIAE)ISOLATED  Culture, blood (routine x 2)     Status: None (Preliminary result)   Collection Time: 09/13/15  7:55 AM  Result Value Ref Range Status   Specimen Description BLOOD LEFT ARM  Final   Special Requests BOTTLES DRAWN AEROBIC AND ANAEROBIC 10 CC  Final   Culture   Final    NO GROWTH 2 DAYS Performed at [facility]    Report Status PENDING  Incomplete  Culture, blood (routine x 2)     Status: None (Preliminary result)   Collection Time: 09/13/15  7:55 AM  Result Value Ref Range Status   Specimen Description BLOOD LEFT HAND  Final   Special  Requests BOTTLES DRAWN AEROBIC AND ANAEROBIC 10CC  Final   Culture   Final    NO GROWTH 2 DAYS Performed at [facility]    Report Status PENDING  Incomplete     Studies: Dg Shoulder Left  09/14/2015  CLINICAL DATA:   Left shoulder pain for lower for 1 week. No known injury. Initial encounter. EXAM: LEFT SHOULDER - 2+ VIEW COMPARISON:  None. FINDINGS: No acute bony or joint abnormality is identified. The humeral head is high-riding suggestive of chronic rotator cuff tear. There is glenohumeral and acromioclavicular osteoarthritis. A calcification is seen projecting just lateral to the acromion and is likely in the subacromial/subdeltoid bursa. Imaged left lung and ribs appear normal. IMPRESSION: No acute abnormality. Acromioclavicular and glenohumeral osteoarthritis. Findings suggestive of chronic rotator cuff tear. Finding suggestive of calcific subacromial/subdeltoid bursitis. Electronically Signed   By: Inge Rise M.D.   On: 09/14/2015 18:50    Scheduled Meds: . antiseptic oral rinse  7 mL Mouth Rinse BID  . cefTRIAXone (ROCEPHIN)  IV  2 g Intravenous Q24H  . Chlorhexidine Gluconate Cloth  6 each Topical Q0600  . insulin aspart  0-15 Units Subcutaneous 6 times per day  . mupirocin ointment  1 application Nasal BID  . oxychlorosene   Irrigation Once  . phosphorus  250 mg Oral TID  . sodium chloride flush  10-40 mL Intracatheter Q12H  . sodium chloride flush  3 mL Intravenous Q12H   Continuous Infusions:   Antibiotics Given (last 72 hours)    Date/Time Action Medication Dose Rate   09/13/15 0054 Given   vancomycin (VANCOCIN) IVPB 1000 mg/200 mL premix 1,000 mg 200 mL/hr   09/13/15 1158 Given   vancomycin (VANCOCIN) IVPB 1000 mg/200 mL premix 1,000 mg 200 mL/hr   09/13/15 1630 Given  [manually verified]   cefTRIAXone (ROCEPHIN) 2 g in dextrose 5 % 50 mL IVPB 2 g 100 mL/hr   09/14/15 1628 Given   cefTRIAXone (ROCEPHIN) 2 g in dextrose 5 % 50 mL IVPB 2 g 100 mL/hr   09/15/15 1658 Given   cefTRIAXone (ROCEPHIN) 2 g in dextrose 5 % 50 mL IVPB 2 g 100 mL/hr      Principal Problem:   Infection of prosthetic left knee joint (HCC) Active Problems:   S/P left knee resection, abx spacer   DM  (diabetes mellitus) (HCC)   S/P revision of total knee   Septic shock (HCC)   High anion gap metabolic acidosis   Acute renal failure (ARF) (HCC)   Hypokalemia   Severe sepsis (HCC)   Lactic acid acidosis   Streptococcal bacteremia    Time spent: 25 min    [facility], Akasia Ahmad  [facility] Pager [phone]. If 7PM-7AM, please contact night-coverage at [email or web address], password [facility] 09/15/2015, 5:20 PM  LOS: 4 days

## 2015-09-15 NOTE — Progress Notes (Addendum)
     Subjective: 3 Days Post-Op Procedure(s) (LRB): IRRIGATION AND DEBRIDEMENT KNEE WITH POLY EXCHANGE LEFT TOTAL KNEE (Left)   Patient reports pain as mild, pain controlled.  Reviewed the x-ray findings of the left shoulder.  No events throughout the night.   Objective:   VITALS:   Filed Vitals:   09/14/15 2011 09/15/15 0456  BP: 147/68 148/83  Pulse: 91 88  Temp: 98.3 F (36.8 C) 98.3 F (36.8 C)  Resp: 20 18    Dorsiflexion/Plantar flexion intact Incision: dressing C/D/I No cellulitis present Compartment soft  LABS  Recent Labs  09/13/15 0500 09/14/15 0552 09/15/15 0430  HGB 11.9* 12.2* 12.0*  HCT 36.5* 36.3* 36.5*  WBC 15.4* 19.2* 17.1*  PLT 229 326 403*     Recent Labs  09/13/15 0500 09/14/15 0915 09/15/15 0430  NA 142 142 143  K 3.1* 3.0* 3.8  BUN 22* 23* 18  CREATININE 0.77 0.74 0.64  GLUCOSE 118* 156* 138*     Assessment/Plan: 3 Days Post-Op Procedure(s) (LRB): IRRIGATION AND DEBRIDEMENT KNEE WITH POLY EXCHANGE LEFT TOTAL KNEE (Left) Up with therapy Discharge home with home health eventually, when ready. Appreciate ID Recommendations - ceftriaxone 2 g IV daily for 6 weeks through 10/22/2015 Follow up in 2 weeks at [facility]. Follow up with OLIN,Shirah Roseman D in 2 weeks.  Contact information:  [facility] [address], [address] Hunter Santos   PAC  09/15/2015, 8:05 AM

## 2015-09-15 NOTE — Progress Notes (Signed)
Physical Therapy Treatment Patient Details Name: Hunter Santos MRN: [id number] DOB: 1950/09/25 Today's Date: 09/15/2015    History of Present Illness Hunter Santos is a 65 y.o. male with a past medical history of diabetes on multiple drugs, hypertension, previous history of left knee prosthesis infection who developed L knee swelling , pain, inability to ambulate and 3 falls at home. Seen by Dr. Alvan Santos and scheduled for I and D 09/12/15. Unfortunaley falls  prompted EMS to bring to ED.2/26/117.    In the emergency department, patient was found to be tachycardic, hypotensive, tachypneic. Lactic acid is 3.5. Patient has high anion gap metabolic acidosis. He is hypokalemic. He appears to have severe sepsis with possible septic shock.     PT Comments    Patient requiring nearly 3 persons to safely stand and ambuate 6'. Discussed recommendation for Skilled  Setting for more therapies and to get to a functional level to return home. Patient reports that he wants to go home  Or  " give me a bottle of advil and close the door before  I will go to  A facility." Patient's wife works. The patient's Upper extremety dysfunction is the limiting factor.  Continue efforts for mobility  . Wife not present.   Follow Up Recommendations  SNF;Supervision/Assistance - 24 hour     Equipment Recommendations  Wheelchair (measurements PT);Hospital bed (if DC to home.)    Recommendations for Other Services       Precautions / Restrictions Precautions Precautions: Knee Precaution Comments: bilateral shoulder dysfunction/decr. ROM Required Braces or Orthoses: Knee Immobilizer - Left Knee Immobilizer - Left: On when out of bed or walking Restrictions Weight Bearing Restrictions: No    Mobility  Bed Mobility Overal bed mobility: Needs Assistance;+2 for physical assistance;+ 2 for safety/equipment Bed Mobility: Supine to Sit     Supine to sit: Max assist;+2 for physical assistance     General bed  mobility comments: assist for legs and trunk, patient  has minimal use of the UE's for self assisting due to shoulder issues.  Transfers Overall transfer level: Needs assistance Equipment used: Bilateral platform walker (EVA) Transfers: Sit to/from Stand Sit to Stand: Max assist;+2 physical assistance;+2 safety/equipment;From elevated surface         General transfer comment: adjusted height of bed and EVA walker for sit <> stand. Assist to rise and stabilize.  Cues and assist for LLE. [name] height lowered to stand, while standing raised the paltform in order to stand more erect. then, to sit down, platform lowered to protect the RIOM of the  shoulders and bed height rased to meet buttocks.  Ambulation/Gait Ambulation/Gait assistance: Mod assist;+2 safety/equipment;+2 physical assistance Ambulation Distance (Feet): 5 Feet Assistive device: Bilateral platform walker Gait Pattern/deviations: Step-to pattern;Trunk flexed     General Gait Details: using EVA wallker, very small short steps forward, and less backward. Bed brought closer.   Stairs            Wheelchair Mobility    Modified Rankin (Stroke Patients Only)       Balance                                    Cognition Arousal/Alertness: Awake/alert Behavior During Therapy: WFL for tasks assessed/performed Overall Cognitive Status: Within Functional Limits for tasks assessed  Exercises Total Joint Exercises Long Arc Quad: AROM;Both;10 reps;Seated Other Exercises Other Exercises: shoulder AAROM in supine; sitting EOB, leaning forward and sliding arms.  Also scapular retraction/depression and neck stretching as pt is guarding; elevating traps    General Comments        Pertinent Vitals/Pain Pain Assessment: 0-10 Pain Score: 6  Pain Location: bilateral shoulders , L knee is <3 Pain Descriptors / Indicators: Discomfort;Grimacing;Guarding;Tightness Pain Intervention(s):  Monitored during session;Limited activity within patient's tolerance;Patient requesting pain meds-RN notified;RN gave pain meds during session;Repositioned    Home Living                      Prior Function Level of Independence: Independent          PT Goals (current goals can now be found in the care plan section) Acute Rehab PT Goals Patient Stated Goal: to walk Progress towards PT goals: Progressing toward goals    Frequency  7X/week    PT Plan Current plan remains appropriate    Co-evaluation PT/OT/SLP Co-Evaluation/Treatment: Yes Reason for Co-Treatment: Complexity of the patient's impairments (multi-system involvement);For patient/therapist safety PT goals addressed during session: Mobility/safety with mobility OT goals addressed during session: ADL's and self-care     End of Session Equipment Utilized During Treatment: Gait belt Activity Tolerance: Patient limited by fatigue;Patient limited by pain Patient left: in bed;with call bell/phone within reach;with bed alarm set     Time: [id number] PT Time Calculation (min) (ACUTE ONLY): 73 min  Charges:  $Gait Training: 8-22 mins $Therapeutic Exercise: 8-22 mins $Therapeutic Activity: 8-22 mins                    G Codes:      Claretha Cooper 09/15/2015, 12:03 PM Tresa Endo PT [phone]

## 2015-09-15 NOTE — Evaluation (Signed)
Occupational Therapy Evaluation Patient Details Name: Hunter Santos MRN: [id number] DOB: 03-23-1951 Today's Date: 09/15/2015    History of Present Illness Hunter Santos is a 65 y.o. male with a past medical history of diabetes on multiple drugs, hypertension, previous history of left knee prosthesis infection who developed L knee swelling , pain, inability to ambulate and 3 falls at home. He was scheduled for I and D 09/12/15 but falls  prompted EMS to bring to ED.2/26/117.   He was felt to have severe sepsis with possible septic shock.    Clinical Impression   Pt was admitted for the above.  Pt has very limited AROM in bil shoulders and pain which limits his ADLs.  Used KI and EVA walker for sit to stand and taking steps today.  Pt will benefit from skilled OT to increase safety and independence with adls.  Goals reflect +2 assistance for transfers/adls for safety    Follow Up Recommendations  SNF (pt wants home:  may need 2 caregivers)    Equipment Recommendations  None recommended by OT    Recommendations for Other Services       Precautions / Restrictions Precautions Precautions: Knee Knee Immobilizer - Left: On when out of bed or walking Restrictions Weight Bearing Restrictions: No      Mobility Bed Mobility Overal bed mobility: Needs Assistance;+2 for physical assistance;+ 2 for safety/equipment Bed Mobility: Supine to Sit     Supine to sit: Max assist;+2 for physical assistance     General bed mobility comments: assist for legs and trunk  Transfers Overall transfer level: Needs assistance Equipment used:  (EVA walker) Transfers: Sit to/from Stand Sit to Stand: Mod assist;+2 physical assistance;+2 safety/equipment;From elevated surface--did not attempt from a chair         General transfer comment: adjusted height of bed (high) and EVA walker platform (low then raised up) for sit <> stand. Assist to rise and stabilize.  Cues and assist for LLE     Balance                                            ADL Overall ADL's : Needs assistance/impaired     Grooming: Maximal assistance;Sitting   Upper Body Bathing: Maximal assistance;Sitting   Lower Body Bathing: Total assistance;+2 for physical assistance;Sit to/from stand   Upper Body Dressing : Maximal assistance;Sitting   Lower Body Dressing: Total assistance;+2 for physical assistance;Sit to/from stand                 General ADL Comments: able to wash hands, can bend head down a little for adls.  Decreased shoulder ROM greatly impacting ADLs.  Used EVA walker and adjusted height of bed as well as height of platform to accommodate shoulder pain/decreased ROM. Did not transfer to 3:1 as we would not be able to lift surface to assist with sit to stand.  Pt is able to self feed with min A to assist with positioning/repositioning items.  Recommend 2 pillows under elbow to assist     Vision     Perception     Praxis      Pertinent Vitals/Pain       Hand Dominance Right   Extremity/Trunk Assessment Upper Extremity Assessment RUE Deficits / Details: AAROM to shoulder--tolerated 30 degrees.  distally wfls LUE Deficits / Details: AAROM tolerated to 40--guards.  distally  wfls           Communication Communication Communication: No difficulties   Cognition Arousal/Alertness: Awake/alert Behavior During Therapy: WFL for tasks assessed/performed Overall Cognitive Status: Within Functional Limits for tasks assessed                     General Comments       Exercises Exercises: Other exercises Other Exercises Other Exercises: shoulder AAROM in supine; sitting EOB, leaning forward and sliding arms.  Also scapular retraction/depression and neck stretching as pt is guarding; elevating traps   Shoulder Instructions      Home Living                                          Prior Functioning/Environment Level of  Independence: Independent             OT Diagnosis: Acute pain;Generalized weakness   OT Problem List: Decreased strength;Decreased activity tolerance;Pain;Decreased knowledge of use of DME or AE;Decreased range of motion   OT Treatment/Interventions: Self-care/ADL training;DME and/or AE instruction;Therapeutic activities;Patient/family education;Therapeutic exercise    OT Goals(Current goals can be found in the care plan section) Acute Rehab OT Goals Patient Stated Goal: to walk OT Goal Formulation: With patient Time For Goal Achievement: 09/22/15 Potential to Achieve Goals: Good ADL Goals Pt Will Perform Grooming: with min assist;sitting (adapted set up) Pt Will Transfer to Toilet: with max assist;bedside commode;stand pivot transfer (for sit to stand; highest setting) Additional ADL Goal #1: pt will go from sit to stand with min A +2 from elevated bed; max A +2 from highest setting of  3:1 and maintain static standing for 3 minutes with min guard during adls Additional ADL Goal #2: pt will be independent with gravity eliminated shoulder strengthening HEP  OT Frequency: Min 2X/week   Barriers to D/C:            Co-evaluation              End of Session Nurse Communication: Need for lift equipment  Activity Tolerance: Patient tolerated treatment well Patient left:  (EOB with PT)   Time: [id number] (overlapped with PT session) OT Time Calculation (min): 47 min Charges:  OT General Charges $OT Visit: 1 Procedure OT Evaluation $OT Eval Low Complexity: 1 Procedure OT Treatments $Therapeutic Exercise: 8-22 mins G-Codes:    Cici Rodriges 2015/10/12, 10:45 AM  Lesle Chris, OTR/L [phone] October 12, 2015

## 2015-09-15 NOTE — Progress Notes (Signed)
CSW met with pt to review PT recommendations. Pt has declined ST Rehab placement. Pt reports he plans to return home with support from spouse and [facility] services. RNCM will assist with d/c planning. CSW will remain available to assist with SNF placement if plan changes and placement is requested.  Werner Lean LCSW [phone]

## 2015-09-16 LAB — GLUCOSE, CAPILLARY
GLUCOSE-CAPILLARY: 131 mg/dL — AB (ref 65–99)
GLUCOSE-CAPILLARY: 179 mg/dL — AB (ref 65–99)
GLUCOSE-CAPILLARY: 201 mg/dL — AB (ref 65–99)
Glucose-Capillary: 145 mg/dL — ABNORMAL HIGH (ref 65–99)
Glucose-Capillary: 179 mg/dL — ABNORMAL HIGH (ref 65–99)
Glucose-Capillary: 199 mg/dL — ABNORMAL HIGH (ref 65–99)
Glucose-Capillary: 252 mg/dL — ABNORMAL HIGH (ref 65–99)

## 2015-09-16 LAB — CBC
HCT: 37 % — ABNORMAL LOW (ref 39.0–52.0)
Hemoglobin: 11.8 g/dL — ABNORMAL LOW (ref 13.0–17.0)
MCH: 28.7 pg (ref 26.0–34.0)
MCHC: 31.9 g/dL (ref 30.0–36.0)
MCV: 90 fL (ref 78.0–100.0)
Platelets: 306 10*3/uL (ref 150–400)
RBC: 4.11 MIL/uL — ABNORMAL LOW (ref 4.22–5.81)
RDW: 14.4 % (ref 11.5–15.5)
WBC: 18.6 10*3/uL — ABNORMAL HIGH (ref 4.0–10.5)

## 2015-09-16 LAB — BASIC METABOLIC PANEL
Anion gap: 11 (ref 5–15)
BUN: 17 mg/dL (ref 6–20)
CALCIUM: 8 mg/dL — AB (ref 8.9–10.3)
CO2: 24 mmol/L (ref 22–32)
CREATININE: 0.72 mg/dL (ref 0.61–1.24)
Chloride: 105 mmol/L (ref 101–111)
GFR calc non Af Amer: >60 mL/min (ref >60)
Glucose, Bld: 128 mg/dL — ABNORMAL HIGH (ref 65–99)
Potassium: 3.1 mmol/L — ABNORMAL LOW (ref 3.5–5.1)
SODIUM: 140 mmol/L (ref 135–145)

## 2015-09-16 MED ORDER — POTASSIUM CHLORIDE 20 MEQ PO PACK
40.0000 meq | PACK | ORAL | Status: DC
  Filled 2015-09-16 (×2): qty 2

## 2015-09-16 MED ORDER — POTASSIUM CHLORIDE 20 MEQ/15ML (10%) PO SOLN
40.0000 meq | ORAL | Status: AC
  Administered 2015-09-16 (×2): 40 meq via ORAL
  Filled 2015-09-16 (×2): qty 30

## 2015-09-16 NOTE — Progress Notes (Signed)
Physical Therapy Treatment Patient Details Name: Hunter Santos MRN: [id number] DOB: 08-20-1950 Today's Date: 09/16/2015    History of Present Illness Hunter Santos is a 65 y.o. male with a past medical history of diabetes on multiple drugs, hypertension, previous history of left knee prosthesis infection who developed L knee swelling , pain, inability to ambulate and 3 falls at home. Seen by Dr. Alvan Dame and scheduled for I and D 09/12/15. Unfortunaley falls  prompted EMS to bring to ED.2/26/117.    In the emergency department, patient was found to be tachycardic, hypotensive, tachypneic. Lactic acid is 3.5. Patient has high anion gap metabolic acidosis. He is hypokalemic. He appears to have severe sepsis with possible septic shock.     PT Comments    POD # 4 Pt rethinking about his ability to D/C to home.  Progressing slowly and still requires + 2 assist for mobility.  Very limited use of B UE's due to OA .  Pt will need ST Rehab at SNF.  Follow Up Recommendations  SNF     Equipment Recommendations       Recommendations for Other Services       Precautions / Restrictions Precautions Precautions: Knee Precaution Comments: bilateral shoulder dysfunction/decr. ROM Required Braces or Orthoses: Knee Immobilizer - Left Knee Immobilizer - Left: On when out of bed or walking Restrictions Weight Bearing Restrictions: No Other Position/Activity Restrictions: WBAT    Mobility  Bed Mobility Overal bed mobility: Needs Assistance;+2 for physical assistance;+ 2 for safety/equipment Bed Mobility: Supine to Sit     Supine to sit: Max assist;+2 for physical assistance;Total assist     General bed mobility comments: assist for legs and trunk, patient  has minimal use of the UE's for self assisting due to shoulder issues.  Once upright, pt was able to sit EOB x 5 min at Supervision level.   Transfers Overall transfer level: Needs assistance Equipment used: Rolling walker (2  wheeled) Transfers: Sit to/from Stand Sit to Stand: Max assist;+2 physical assistance;+2 safety/equipment;From elevated surface         General transfer comment: extremely elevated bed pt was able to stand + 2 assisi and some push off assist using R UE.  Very limited B shoulder flex esp > 40 degrees.    Ambulation/Gait Ambulation/Gait assistance: Mod assist;+2 physical assistance;+2 safety/equipment Ambulation Distance (Feet): 6 Feet Assistive device: Rolling walker (2 wheeled) (walker height set such that B elbows full extension) Gait Pattern/deviations: Step-to pattern Gait velocity: decreased   General Gait Details: + 2 assist for safety with limited amb distance due to max c/o weakness.    Stairs            Wheelchair Mobility    Modified Rankin (Stroke Patients Only)       Balance                                    Cognition Arousal/Alertness: Awake/alert Behavior During Therapy: WFL for tasks assessed/performed Overall Cognitive Status: Within Functional Limits for tasks assessed                      Exercises      General Comments        Pertinent Vitals/Pain Pain Assessment: 0-10 Pain Score: 7  Pain Location: at rest 2/10 knee pain.  7/10 knee pain with activity Pain Descriptors / Indicators: Grimacing;Tender Pain Intervention(s): Monitored during  session;Repositioned;Ice applied    Home Living                      Prior Function            PT Goals (current goals can now be found in the care plan section) Progress towards PT goals: Progressing toward goals    Frequency  7X/week    PT Plan Current plan remains appropriate    Co-evaluation             End of Session Equipment Utilized During Treatment: Gait belt Activity Tolerance: Patient limited by fatigue;Patient limited by pain (infection) Patient left: in chair;with call bell/phone within reach;with chair alarm set     Time: 1010-1035 PT  Time Calculation (min) (ACUTE ONLY): 25 min  Charges:  $Gait Training: 8-22 mins $Therapeutic Activity: 8-22 mins                    G Codes:      Rica Koyanagi  PTA [facility]  Acute  Rehab Pager      [phone]

## 2015-09-16 NOTE — Progress Notes (Signed)
     Subjective: 4 Days Post-Op Procedure(s) (LRB): IRRIGATION AND DEBRIDEMENT KNEE WITH POLY EXCHANGE LEFT TOTAL KNEE (Left)   Patient reports pain as moderate, pain controlled. No events throughout the night.  Labs tend to be trending in the right direction. Again discussed the shoulder and with the amount of activity he has been doing and the number of times he has fallen lately I believe he has aggravated the OA and RTC changes in the shoulder.  This is something we can address as an outpatient issue.    Objective:   VITALS:   Filed Vitals:   09/15/15 2226 09/16/15 0423  BP: 146/72 140/79  Pulse: 100 90  Temp: 98.1 F (36.7 C) 98.4 F (36.9 C)  Resp: 18 16    Dorsiflexion/Plantar flexion intact Incision: dressing C/D/I No cellulitis present Compartment soft  LABS  Recent Labs  09/14/15 0552 09/15/15 0430 09/16/15 0500  HGB 12.2* 12.0* 11.8*  HCT 36.3* 36.5* 37.0*  WBC 19.2* 17.1* 18.6*  PLT 326 403* 306     Recent Labs  09/14/15 0915 09/15/15 0430 09/16/15 0500  NA 142 143 140  K 3.0* 3.8 3.1*  BUN 23* 18 17  CREATININE 0.74 0.64 0.72  GLUCOSE 156* 138* 128*     Assessment/Plan: 4 Days Post-Op Procedure(s) (LRB): IRRIGATION AND DEBRIDEMENT KNEE WITH POLY EXCHANGE LEFT TOTAL KNEE (Left)  Up with therapy Discharge when ready  Dressing changed to Aquacel dressing by the nurse.  Aquacel dressing is waterproof and may remain in place for 2 weeks, will remove at [facility] post-op visit.        [place] Pugh Lujean Ebright   PAC  09/16/2015, 7:12 AM

## 2015-09-16 NOTE — Clinical Social Work Placement (Signed)
   CLINICAL SOCIAL WORK PLACEMENT  NOTE  Date:  09/16/2015  Patient Details  Name: Hunter Santos MRN: [id number] Date of Birth: 1950/10/12  Clinical Social Work is seeking post-discharge placement for this patient at the [place] level of care (*CSW will initial, date and re-position this form in  chart as items are completed):  Yes   Patient/family provided with [place] Work Department's list of facilities offering this level of care within the geographic area requested by the patient (or if unable, by the patient's family).  Yes   Patient/family informed of their freedom to choose among providers that offer the needed level of care, that participate in Medicare, Medicaid or managed care program needed by the patient, have an available bed and are willing to accept the patient.  Yes   Patient/family informed of [facility]'s ownership interest in [facility] and [facility], as well as of the fact that they are under no obligation to receive care at these facilities.  PASRR submitted to EDS on 09/16/15     PASRR number received on 09/16/15     Existing PASRR number confirmed on       FL2 transmitted to all facilities in geographic area requested by pt/family on 09/16/15     FL2 transmitted to all facilities within larger geographic area on       Patient informed that his/her managed care company has contracts with or will negotiate with certain facilities, including the following:            Patient/family informed of bed offers received.  Patient chooses bed at       Physician recommends and patient chooses bed at      Patient to be transferred to   on  .  Patient to be transferred to facility by       Patient family notified on   of transfer.  Name of family member notified:        PHYSICIAN       Additional Comment:    _______________________________________________ Luretha Rued, [place] 09/16/2015,  11:19 AM

## 2015-09-16 NOTE — Progress Notes (Signed)
Occupational Therapy Treatment Patient Details Name: Hunter Santos MRN: [id number] DOB: 1950-11-22 Today's Date: 09/16/2015    History of present illness Hunter Santos is a 64 y.o. male with a past medical history of diabetes on multiple drugs, hypertension, previous history of left knee prosthesis infection who developed L knee swelling , pain, inability to ambulate and 3 falls at home. Seen by Dr. Alvan Dame and scheduled for I and D 09/12/15. Unfortunaley falls  prompted EMS to bring to ED.2/26/117.    In the emergency department, patient was found to be tachycardic, hypotensive, tachypneic. Lactic acid is 3.5. Patient has high anion gap metabolic acidosis. He is hypokalemic. He appears to have severe sepsis with possible septic shock.    OT comments  Pt very motivated.  Very limited use of bil UE's.  Worked on ROM/strengthening and repositioned on pillows at end of session.  Follow Up Recommendations  SNF    Equipment Recommendations  None recommended by OT    Recommendations for Other Services      Precautions / Restrictions Precautions Precautions: Knee Precaution Comments: bilateral shoulder dysfunction/decr. ROM Required Braces or Orthoses: Knee Immobilizer - Left Knee Immobilizer - Left: On when out of bed or walking Restrictions Weight Bearing Restrictions: No Other Position/Activity Restrictions: WBAT       Mobility Bed Mobility Overal bed mobility: Needs Assistance;+2 for physical assistance;+ 2 for safety/equipment Bed Mobility: Supine to Sit     Supine to sit: Max assist;+2 for physical assistance (total +2 to return to supine)     General bed mobility comments: assist for legs and trunk.  elevated HOB to assist.   Pt needed max +2 assistance to roll to straighten chux pad   Transfers Overall transfer level: Needs assistance Equipment used: Rolling walker (2 wheeled) Transfers: Sit to/from Stand Sit to Stand: Max assist;+2 physical assistance;+2  safety/equipment;From elevated surface         General transfer comment: not performed    Balance   Sitting-balance support: Feet supported   Sitting balance - Comments: occasional min A for balance when performing exercises. Sat EOB x 20 min                           ADL                                                Vision                     Perception     Praxis      Cognition   Behavior During Therapy: WFL for tasks assessed/performed Overall Cognitive Status: Within Functional Limits for tasks assessed                       Extremity/Trunk Assessment               Exercises Other Exercises Other Exercises: while sitting EOB, leaned forward and reached arms down past knees for FF; also worked on each arm individually with towel on table for FF.   Shoulder Instructions       General Comments      Pertinent Vitals/ Pain       Pain Assessment: 0-10 Pain Score: 6  Pain Location: bil shoulders Pain Descriptors / Indicators: Aching;Grimacing Pain  Intervention(s): Limited activity within patient's tolerance;Monitored during session;Repositioned;Patient requesting pain meds-RN notified  Home Living                                          Prior Functioning/Environment              Frequency Min 2X/week     Progress Toward Goals  OT Goals(current goals can now be found in the care plan section)  Progress towards OT goals: Progressing toward goals     Plan      Co-evaluation                 End of Session     Activity Tolerance Patient tolerated treatment well   Patient Left in bed;with call bell/phone within reach (urinal and cell phone within reach)   Nurse Communication          Time: 1313-1350 OT Time Calculation (min): 37 min  Charges: OT General Charges $OT Visit: 1 Procedure OT Treatments $Therapeutic Activity: 8-22 mins $Therapeutic Exercise: 8-22  mins  Ernest Orr 09/16/2015, 4:00 PM  Lesle Chris, OTR/L [phone] 09/16/2015

## 2015-09-16 NOTE — NC FL2 (Signed)
[place] LEVEL OF CARE SCREENING TOOL     IDENTIFICATION  Patient Name: Hunter Santos Birthdate: 1951-01-02 Sex: male Admission Date (Current Location): 09/11/2015  [facility] and [place] Number:  Herbalist and Address:  [facility],  [place] [address] [place]      Provider Number: [id number]  Attending Physician Name and Address:  Albertine Patricia, MD  Relative Name and Phone Number:       Current Level of Care: Hospital Recommended Level of Care: [place] Prior Approval Number:    Date Approved/Denied:   PASRR Number: [id number]  Discharge Plan: SNF    Current Diagnoses: Patient Active Problem List   Diagnosis Date Noted  . Streptococcal bacteremia 09/12/2015  . Septic shock ([place]) 09/11/2015  . High anion gap metabolic acidosis [id number]  . Acute renal failure (ARF) ([place]) 09/11/2015  . Hypokalemia 09/11/2015  . Severe sepsis ([place]) 09/11/2015  . Lactic acid acidosis 09/11/2015  . S/P revision of total knee 08/17/2013  . DM (diabetes mellitus) ([place]) 06/16/2013  . HTN (hypertension) 06/16/2013  . OSA (obstructive sleep apnea) 06/16/2013  . Infection of prosthetic left knee joint ([place]) 06/16/2013  . S/P left knee resection, abx spacer 06/15/2013  . Expected blood loss anemia 04/30/2012  . Obese 04/30/2012  . Hyponatremia 04/30/2012    Orientation RESPIRATION BLADDER Height & Weight     Self, Time, Situation, Place  Normal Continent Weight: 119.2 kg (262 lb 12.6 oz) Height:  6\' 2"  (188 cm)  BEHAVIORAL SYMPTOMS/MOOD NEUROLOGICAL BOWEL NUTRITION STATUS  Other (Comment) (no behaviors)   Continent Diet (carb modified)  AMBULATORY STATUS COMMUNICATION OF NEEDS Skin   Extensive Assist Verbally Surgical wounds                       Personal Care Assistance Level of Assistance  Bathing, Feeding, Dressing Bathing Assistance: Limited assistance Feeding assistance: Independent Dressing  Assistance: Limited assistance     Functional Limitations Info  Sight, Hearing, Speech Sight Info: Adequate Hearing Info: Adequate Speech Info: Adequate    SPECIAL CARE FACTORS FREQUENCY  PT (By licensed PT), OT (By licensed OT)     PT Frequency: 5 x wk OT Frequency: 5 x wk            Contractures Contractures Info: Not present    Additional Factors Info  Code Status Code Status Info: FULL CODE             Current Medications (09/16/2015):  This is the current hospital active medication list Current Facility-Administered Medications  Medication Dose Route Frequency Provider Last Rate Last Dose  . acetaminophen (TYLENOL) tablet 650 mg  650 mg Oral Q6H PRN Bonnielee Haff, MD   650 mg at 09/16/15 0442   Or  . acetaminophen (TYLENOL) suppository 650 mg  650 mg Rectal Q6H PRN Bonnielee Haff, MD      . antiseptic oral rinse (CPC / CETYLPYRIDINIUM CHLORIDE 0.05%) solution 7 mL  7 mL Mouth Rinse BID Bonnielee Haff, MD   7 mL at 09/15/15 2236  . cefTRIAXone (ROCEPHIN) 2 g in dextrose 5 % 50 mL IVPB  2 g Intravenous Q24H [name], MD   2 g at 09/15/15 1658  . insulin aspart (novoLOG) injection 0-15 Units  0-15 Units Subcutaneous 6 times per day Bonnielee Haff, MD   2 Units at 09/16/15 [phone]  . methocarbamol (ROBAXIN) tablet 500 mg  500 mg Oral Q6H PRN Danae Orleans,  PA-C   500 mg at 09/16/15 0442  . morphine 2 MG/ML injection 1 mg  1 mg Intravenous Q3H PRN Geradine Girt, DO   1 mg at 09/12/15 1044  . mupirocin ointment (BACTROBAN) 2 % 1 application  1 application Nasal BID Bonnielee Haff, MD   1 application at [id number]  . ondansetron (ZOFRAN) tablet 4 mg  4 mg Oral Q6H PRN Bonnielee Haff, MD       Or  . ondansetron [facility]) injection 4 mg  4 mg Intravenous Q6H PRN Bonnielee Haff, MD      . oxychlorosene (CLORPACTIN WCS 90) powder   Irrigation Once Paralee Cancel, MD      . oxyCODONE (Oxy IR/ROXICODONE) immediate release tablet 5-10 mg  5-10 mg Oral Q4H PRN Danae Orleans,  PA-C   10 mg at 09/15/15 2006  . phosphorus (K PHOS NEUTRAL) tablet 250 mg  250 mg Oral TID Bonnielee Haff, MD   250 mg at 09/15/15 2234  . potassium chloride 20 MEQ/15ML (10%) solution 40 mEq  40 mEq Oral Q4H Albertine Patricia, MD   40 mEq at 09/16/15 0907  . sodium chloride flush (NS) 0.9 % injection 10-40 mL  10-40 mL Intracatheter Q12H Geradine Girt, DO   10 mL at 09/15/15 2234  . sodium chloride flush (NS) 0.9 % injection 10-40 mL  10-40 mL Intracatheter PRN Geradine Girt, DO   10 mL at 09/16/15 0358  . sodium chloride flush (NS) 0.9 % injection 3 mL  3 mL Intravenous Q12H Bonnielee Haff, MD   3 mL at 09/15/15 2234     Discharge Medications: Please see discharge summary for a list of discharge medications.  Relevant Imaging Results:  Relevant Lab Results:   Additional Information SS # [id number], MRSA + pcr 09/11/15, PICC line in place for IV Rocephin 2 g daily through 10/22/15.  Dionisios Ricci, Randall An, LCSW

## 2015-09-16 NOTE — Progress Notes (Signed)
OT Cancellation Note  Patient Details Name: [name] MRN: [id number] DOB: 07/03/1951   Cancelled Treatment:    Reason Eval/Treat Not Completed: Other (comment).  Pt eating lunch and states he hasn't been back to bed long. Will return later today if schedule permits.  Jean Alejos 09/16/2015, 2:02 PM  Lesle Chris, OTR/L [phone] 09/16/2015

## 2015-09-16 NOTE — Progress Notes (Addendum)
PROGRESS NOTE  Hunter Santos [id number] DOB: May 29, 1951 DOA: 09/11/2015 PCP: Dwan Bolt, MD  [name], 65 y.o. male, has a history of infection in the left total knee arthroplasty. S/p 6 weeks of IV antibiotics in 2015. In early February of 2015 he underwent revision of the total knee arthroplasty and reimplantation of a total knee arthroplasty after removal of antibiotic spacer.  And then about a week ago he started developed pain in the left knee. The knee started swelling. He had difficulty walking. He subsequently went to see his orthopedic doctor on Thursday or Friday. Apparently, they performed x-rays of the knee. No fractures were noted. The knee was noted to be swollen. Arthrocentesis was done and apparently raised concern for infection, seen by orthopedic, he went for surgical low-salt and poly-exchange on 2/27, by ID regarding antibiotics recommendation.  Assessment/Plan:  Severe sepsis secondary to left knee infection/group B streptococcal bacteremia -Sepsis protocol has been initiated, significantly improved, lactic acid normalized, still has significant leukocytosis -Lactic acid level has normalized -has aspiration of knee in office that grew: aspiration in the office that he had a beta-hemolytic strep  -Blood cultures : Group B streptococcus agalactiae - Status post left knee IRRIGATION AND DEBRIDEMENT KNEE WITH POLY EXCHANGE LEFT TOTAL KNEE by Dr. Alvan Dame 2/27 - ID consult greatly appreciated, initially on IV vancomycin, transitioned to IV Rocephin on 2/28, and to continue for 6 week 10/22/2015 - Leukocytosis remains elevated, CRP remains significantly elevated at 14.6  Group B streptococcal bacteremia - Can do to left knee infection, management per ID  High anion gap metabolic acidosis: -resolved with IVF  Left shoulder pain - xray was significant for osteoarthritis, bursitis, and chronic rotator cough tear , continue with OT  history of diabetes  mellitus type 2:  -HgbA1C -SSI -hold PO meds  Hypokalemia -repleted, check an a.m.   history of essential hypertension:  -Initially hypotensive, blood pressure started to increase, resume gradually home medication if remains elevated.  Mild acute renal failure   -Resolved  Code Status: full Family Communication: Discussed with patient Disposition Plan: for SNF placement in am if remains stable.   Consultants:  PCCM  Ortho Dr. Alvan Dame  ID Dr. Megan Salon  Procedures: Status post left knee IRRIGATION AND DEBRIDEMENT KNEE WITH POLY EXCHANGE LEFT TOTAL KNEE by Dr. Alvan Dame 2/27    HPI/Subjective: Denies any fever or chills, no chest pain, abdominal pain, nausea or vomiting   Objective: Filed Vitals:   09/16/15 0423 09/16/15 1457  BP: 140/79 158/86  Pulse: 90 19  Temp: 98.4 F (36.9 C) 98.7 F (37.1 C)  Resp: 16 18    Intake/Output Summary (Last 24 hours) at 09/16/15 1549 Last data filed at 09/16/15 1500  Gross per 24 hour  Intake    840 ml  Output   2150 ml  Net  -1310 ml   Filed Weights   09/11/15 1053 09/11/15 1615  Weight: 127.007 kg (280 lb) 119.2 kg (262 lb 12.6 oz)    Exam:   General:  Awake, NAD  Cardiovascular: rrr, no murmur rubs or gallops  Respiratory: clear to auscultation bilaterally, good air entry  Abdomen: +BS, soft, nontender, nondistended  Musculoskeletal: left leg in brace  Data Reviewed: Basic Metabolic Panel:  Recent Labs Lab 09/11/15 1521  09/12/15 0324 09/12/15 1454 09/13/15 0500 09/14/15 0915 09/15/15 0430 09/16/15 0500  NA  --   < > 138 140 142 142 143 140  K  --   < > 3.0* 3.1* 3.1*  3.0* 3.8 3.1*  CL  --   < > 107  --  111 108 110 105  CO2  --   < > 19*  --  21* 21* 23 24  GLUCOSE  --   < > 79 121* 118* 156* 138* 128*  BUN  --   < > 23*  --  22* 23* 18 17  CREATININE  --   < > 0.96  --  0.77 0.74 0.64 0.72  CALCIUM  --   < > 7.9*  --  8.4* 8.4* 8.2* 8.0*  MG 1.7  --  1.8  --  2.2 2.0  --   --   PHOS 1.0*  --   2.1*  --  2.5 3.1  --   --   < > = values in this interval not displayed. Liver Function Tests:  Recent Labs Lab 09/11/15 1105 09/11/15 1521  AST 23 78*  ALT 20 35  ALKPHOS 87 70  BILITOT 2.4* 1.8*  PROT 6.9 5.4*  ALBUMIN 2.7* 2.2*   No results for input(s): LIPASE, AMYLASE in the last 168 hours. No results for input(s): AMMONIA in the last 168 hours. CBC:  Recent Labs Lab 09/11/15 1105 09/12/15 0324 09/12/15 1454 09/13/15 0500 09/14/15 0552 09/15/15 0430 09/16/15 0500  WBC 5.7 13.8*  --  15.4* 19.2* 17.1* 18.6*  NEUTROABS 5.2 11.8*  --  13.4* 16.7* 13.8*  --   HGB 15.2 12.0* 13.3 11.9* 12.2* 12.0* 11.8*  HCT 44.9 35.8* 39.0 36.5* 36.3* 36.5* 37.0*  MCV 87.2 87.3  --  87.1 87.5 88.2 90.0  PLT 222 192  --  229 326 403* 306   Cardiac Enzymes:  Recent Labs Lab 09/13/15 0513  CKTOTAL 98   BNP (last 3 results) No results for input(s): BNP in the last 8760 hours.  ProBNP (last 3 results) No results for input(s): PROBNP in the last 8760 hours.  CBG:  Recent Labs Lab 09/15/15 1934 09/16/15 0001 09/16/15 0432 09/16/15 0803 09/16/15 1148  GLUCAP 173* 179* 145* 131* 252*    Recent Results (from the past 240 hour(s))  Blood Culture (routine x 2)     Status: None   Collection Time: 09/11/15 11:00 AM  Result Value Ref Range Status   Specimen Description BLOOD RIGHT ANTECUBITAL  Final   Special Requests BOTTLES DRAWN AEROBIC AND ANAEROBIC 5CC  Final   Culture  Setup Time   Final    GRAM POSITIVE COCCI IN PAIRS AND CHAINS IN BOTH AEROBIC AND ANAEROBIC BOTTLES CRITICAL RESULT CALLED TO, READ BACK BY AND VERIFIED WITH: A CHAVZE,RN [id number] [place]    Culture   Final    STREPTOCOCCUS AGALACTIAE Performed at [facility]    Report Status 09/14/2015 FINAL  Final   Organism ID, Bacteria STREPTOCOCCUS AGALACTIAE  Final      Susceptibility   Streptococcus agalactiae - MIC*    CLINDAMYCIN <=0.25 SENSITIVE Sensitive     AMPICILLIN <=0.25 SENSITIVE  Sensitive     ERYTHROMYCIN 2 RESISTANT Resistant     VANCOMYCIN 0.5 SENSITIVE Sensitive     CEFTRIAXONE <=0.12 SENSITIVE Sensitive     LEVOFLOXACIN 2 SENSITIVE Sensitive     * STREPTOCOCCUS AGALACTIAE  Blood Culture (routine x 2)     Status: None   Collection Time: 09/11/15 11:05 AM  Result Value Ref Range Status   Specimen Description BLOOD BLOOD RIGHT FOREARM  Final   Special Requests BOTTLES DRAWN AEROBIC AND ANAEROBIC 5CC  Final   Culture  Setup Time   Final    GRAM POSITIVE COCCI IN PAIRS AND CHAINS IN BOTH AEROBIC AND ANAEROBIC BOTTLES CRITICAL RESULT CALLED TO, READ BACK BY AND VERIFIED WITH: A CHAVZE,RN ID:[id number] 0110 [place]    Culture   Final    GROUP B STREP(S.AGALACTIAE)ISOLATED SUSCEPTIBILITIES PERFORMED ON PREVIOUS CULTURE WITHIN THE LAST 5 DAYS. Performed at [facility]    Report Status 09/14/2015 FINAL  Final  Urine culture     Status: None   Collection Time: 09/11/15  1:38 PM  Result Value Ref Range Status   Specimen Description URINE, CLEAN CATCH  Final   Special Requests NONE  Final   Culture   Final    MULTIPLE SPECIES PRESENT, SUGGEST RECOLLECTION Performed at [facility]    Report Status 09/12/2015 FINAL  Final  MRSA PCR Screening     Status: Abnormal   Collection Time: 09/11/15  4:28 PM  Result Value Ref Range Status   MRSA by PCR POSITIVE (A) NEGATIVE Final    Comment:        The GeneXpert MRSA Assay (FDA approved for NASAL specimens only), is one component of a comprehensive MRSA colonization surveillance program. It is not intended to diagnose MRSA infection nor to guide or monitor treatment for MRSA infections. RESULT CALLED TO, READ BACK BY AND VERIFIED WITH: Q.[email or web address],RN AT 2014 ON 09/11/15 BY W.SHEA   Anaerobic culture     Status: None (Preliminary result)   Collection Time: 09/12/15  4:31 PM  Result Value Ref Range Status   Specimen Description SYNOVIAL LEFT KNEE  Final   Special Requests NONE  Final   Gram Stain   Final     ABUNDANT WBC PRESENT,BOTH PMN AND MONONUCLEAR ABUNDANT GRAM POSITIVE COCCI IN PAIRS IN CHAINS    Culture   Final    NO ANAEROBES ISOLATED; CULTURE IN PROGRESS FOR 5 DAYS Performed at [facility]    Report Status PENDING  Incomplete  Body fluid culture     Status: None   Collection Time: 09/12/15  4:31 PM  Result Value Ref Range Status   Specimen Description SYNOVIAL FLUID LEFT KNEE  Final   Special Requests PATIENT ON FOLLOWING VANC AND ZOSYN  Final   Gram Stain   Final    ABUNDANT WBC PRESENT,BOTH PMN AND MONONUCLEAR ABUNDANT GRAM POSITIVE COCCI IN PAIRS IN CHAINS Gram Stain Report Called to,Read Back By and Verified With: T PALEN AT 1815 ON 02.27.2017 BY NBROOKS     Culture   Final    GROUP B STREP(S.AGALACTIAE)ISOLATED Performed at [facility]    Report Status 09/15/2015 FINAL  Final   Organism ID, Bacteria GROUP B STREP(S.AGALACTIAE)ISOLATED  Final      Susceptibility   Group b strep(s.agalactiae)isolated - MIC*    CLINDAMYCIN <=0.25 SENSITIVE Sensitive     AMPICILLIN <=0.25 SENSITIVE Sensitive     ERYTHROMYCIN 2 RESISTANT Resistant     VANCOMYCIN 0.5 SENSITIVE Sensitive     CEFTRIAXONE <=0.12 SENSITIVE Sensitive     LEVOFLOXACIN 1 SENSITIVE Sensitive     * GROUP B STREP(S.AGALACTIAE)ISOLATED  Culture, blood (routine x 2)     Status: None (Preliminary result)   Collection Time: 09/13/15  7:55 AM  Result Value Ref Range Status   Specimen Description BLOOD LEFT ARM  Final   Special Requests BOTTLES DRAWN AEROBIC AND ANAEROBIC 10 CC  Final   Culture   Final    NO GROWTH 3 DAYS Performed at [facility]  Report Status PENDING  Incomplete  Culture, blood (routine x 2)     Status: None (Preliminary result)   Collection Time: 09/13/15  7:55 AM  Result Value Ref Range Status   Specimen Description BLOOD LEFT HAND  Final   Special Requests BOTTLES DRAWN AEROBIC AND ANAEROBIC 10CC  Final   Culture   Final    NO GROWTH 3 DAYS Performed at [facility]    Report Status PENDING  Incomplete     Studies: Dg Shoulder Left  09/14/2015  CLINICAL DATA:  Left shoulder pain for lower for 1 week. No known injury. Initial encounter. EXAM: LEFT SHOULDER - 2+ VIEW COMPARISON:  None. FINDINGS: No acute bony or joint abnormality is identified. The humeral head is high-riding suggestive of chronic rotator cuff tear. There is glenohumeral and acromioclavicular osteoarthritis. A calcification is seen projecting just lateral to the acromion and is likely in the subacromial/subdeltoid bursa. Imaged left lung and ribs appear normal. IMPRESSION: No acute abnormality. Acromioclavicular and glenohumeral osteoarthritis. Findings suggestive of chronic rotator cuff tear. Finding suggestive of calcific subacromial/subdeltoid bursitis. Electronically Signed   By: Inge Rise M.D.   On: 09/14/2015 18:50    Scheduled Meds: . antiseptic oral rinse  7 mL Mouth Rinse BID  . cefTRIAXone (ROCEPHIN)  IV  2 g Intravenous Q24H  . insulin aspart  0-15 Units Subcutaneous 6 times per day  . oxychlorosene   Irrigation Once  . phosphorus  250 mg Oral TID  . sodium chloride flush  10-40 mL Intracatheter Q12H  . sodium chloride flush  3 mL Intravenous Q12H   Continuous Infusions:   Antibiotics Given (last 72 hours)    Date/Time Action Medication Dose Rate   09/13/15 1630 Given  [manually verified]   cefTRIAXone (ROCEPHIN) 2 g in dextrose 5 % 50 mL IVPB 2 g 100 mL/hr   09/14/15 1628 Given   cefTRIAXone (ROCEPHIN) 2 g in dextrose 5 % 50 mL IVPB 2 g 100 mL/hr   09/15/15 1658 Given   cefTRIAXone (ROCEPHIN) 2 g in dextrose 5 % 50 mL IVPB 2 g 100 mL/hr      Principal Problem:   Infection of prosthetic left knee joint (HCC) Active Problems:   S/P left knee resection, abx spacer   DM (diabetes mellitus) (HCC)   S/P revision of total knee   Septic shock (HCC)   High anion gap metabolic acidosis   Acute renal failure (ARF) (HCC)   Hypokalemia   Severe sepsis  (HCC)   Lactic acid acidosis   Streptococcal bacteremia    Time spent: 25 min    [facility], [place]  [facility] Pager [phone]. If 7PM-7AM, please contact night-coverage at [email or web address], password [facility] 09/16/2015, 3:49 PM  LOS: 5 days

## 2015-09-16 NOTE — Clinical Social Work Note (Addendum)
Clinical Social Work Assessment  Patient Details  Name: Hunter Santos MRN: [id number] Date of Birth: [birth year]  Date of referral:  09/16/15               Reason for consult:  Facility Placement, Discharge Planning                Permission sought to share information with:  Chartered certified accountant granted to share information::  Yes, Verbal Permission Granted  Name::        Agency::     Relationship::     Contact Information:     Housing/Transportation Living arrangements for the past 2 months:  Single Family Home Source of Information:  Patient Patient Interpreter Needed:  None Criminal Activity/Legal Involvement Pertinent to Current Situation/Hospitalization:  No - Comment as needed Significant Relationships:  Spouse Lives with:  Spouse Do you feel safe going back to the place where you live?   (ST Rehab needed.) Need for family participation in patient care:  No (Coment)  Care giving concerns:  Pt's care cannot be managed at home following hospital d/c.   Social Worker assessment / plan:  Pt hospitalized on 09/11/15 with septic shock and an infection of his prosthetic knee. CSW was informed this am that pt is now willing to accept ST Rehab placement which has been recommended by PT / MD. CSW has met with pt and SNF search has been initiated. Bed offers are pending. CSW will continue to follow to assist with d/c planning needs.  Employment status:  Retired Surveyor, [employer] PT Recommendations:  [place] / Referral to community resources:  [place]  Patient/Family's Response to care:  Pt realizes ST Rehab is needed.  Patient/Family's Understanding of and Emotional Response to Diagnosis, Current Treatment, and Prognosis:  Pt is aware of is medical status. " My wife asked how I will manage at home and I finally realized that I can't manage. " Pt prefers to return home but is willing to try rehab.  Support and reassurance provided.  Emotional Assessment Appearance:  Appears stated age Attitude/Demeanor/Rapport:  Other (cooperative) Affect (typically observed):  Calm, Apprehensive Orientation:  Oriented to Self, Oriented to Place, Oriented to  Time, Oriented to Situation Alcohol / Substance use:  Not Applicable Psych involvement (Current and /or in the community):  No (Comment)  Discharge Needs  Concerns to be addressed:  Discharge Planning Concerns Readmission within the last 30 days:  Yes Current discharge risk:  None Barriers to Discharge:  No Barriers Identified   Silena Wyss, Randall An, LCSW 09/16/2015, 10:53 AM

## 2015-09-16 NOTE — Care Management Note (Signed)
Case Management Note  Patient Details  Name: MAKAEL BUELNA MRN: [id number] Date of Birth: 1951-02-22  Subjective/Objective:     S/p Open excisional and nonexcisional debridement of left knee with polyethylene exchange               Action/Plan: Patient now agrees to SNF placement. Notified [facility] and CSW of change in d/c plan. Discharge planning per CSW  Expected Discharge Date:                  Expected Discharge Plan:  [place]  In-House Referral:  NA, Clinical Social Work  Discharge planning Services  CM Consult  Post Acute Care Choice:  NA Choice offered to:  NA  DME Arranged:  N/A DME Agency:  NA  HH Arranged:  NA HH Agency:  NA  Status of Service:  Completed, signed off  Medicare Important Message Given:    Date Medicare IM Given:    Medicare IM give by:    Date Additional Medicare IM Given:    Additional Medicare Important Message give by:     If discussed at [place] of Stay Meetings, dates discussed:    Additional Comments:  Guadalupe Maple, RN 09/16/2015, 10:53 AM [phone]

## 2015-09-17 LAB — CBC
HCT: 34.8 % — ABNORMAL LOW (ref 39.0–52.0)
HEMOGLOBIN: 11.2 g/dL — AB (ref 13.0–17.0)
MCH: 29.2 pg (ref 26.0–34.0)
MCHC: 32.2 g/dL (ref 30.0–36.0)
MCV: 90.6 fL (ref 78.0–100.0)
PLATELETS: 311 10*3/uL (ref 150–400)
RBC: 3.84 MIL/uL — AB (ref 4.22–5.81)
RDW: 14.4 % (ref 11.5–15.5)
WBC: 14.2 10*3/uL — ABNORMAL HIGH (ref 4.0–10.5)

## 2015-09-17 LAB — BASIC METABOLIC PANEL
ANION GAP: 8 (ref 5–15)
BUN: 11 mg/dL (ref 6–20)
CALCIUM: 7.8 mg/dL — AB (ref 8.9–10.3)
CO2: 27 mmol/L (ref 22–32)
Chloride: 101 mmol/L (ref 101–111)
Creatinine, Ser: 0.56 mg/dL — ABNORMAL LOW (ref 0.61–1.24)
GFR calc Af Amer: >60 mL/min (ref >60)
GLUCOSE: 130 mg/dL — AB (ref 65–99)
POTASSIUM: 3.4 mmol/L — AB (ref 3.5–5.1)
SODIUM: 136 mmol/L (ref 135–145)

## 2015-09-17 LAB — GLUCOSE, CAPILLARY
GLUCOSE-CAPILLARY: 127 mg/dL — AB (ref 65–99)
GLUCOSE-CAPILLARY: 174 mg/dL — AB (ref 65–99)

## 2015-09-17 MED ORDER — INSULIN ASPART 100 UNIT/ML ~~LOC~~ SOLN: 0.0000 [IU] | Freq: Three times a day (TID) | SUBCUTANEOUS | Status: DC

## 2015-09-17 MED ORDER — ACETAMINOPHEN 325 MG PO TABS: 650.0000 mg | ORAL_TABLET | Freq: Four times a day (QID) | ORAL | Status: DC | PRN

## 2015-09-17 MED ORDER — POTASSIUM CHLORIDE CRYS ER 20 MEQ PO TBCR
40.0000 meq | EXTENDED_RELEASE_TABLET | Freq: Once | ORAL | Status: AC
  Administered 2015-09-17: 40 meq via ORAL
  Filled 2015-09-17: qty 2

## 2015-09-17 MED ORDER — DEXTROSE 5 % IV SOLN: 2.0000 g | INTRAVENOUS | Status: DC

## 2015-09-17 MED ORDER — ENOXAPARIN SODIUM 40 MG/0.4ML ~~LOC~~ SOLN: 40.0000 mg | SUBCUTANEOUS | Status: DC

## 2015-09-17 MED ORDER — METHOCARBAMOL 500 MG PO TABS: 500.0000 mg | ORAL_TABLET | Freq: Three times a day (TID) | ORAL | Status: DC | PRN

## 2015-09-17 MED ORDER — K PHOS MONO-SOD PHOS DI & MONO 155-852-130 MG PO TABS: 250.0000 mg | ORAL_TABLET | Freq: Every day | ORAL | Status: DC

## 2015-09-17 MED ORDER — ONDANSETRON HCL 4 MG PO TABS: 4.0000 mg | ORAL_TABLET | Freq: Four times a day (QID) | ORAL | Status: DC | PRN

## 2015-09-17 MED ORDER — INSULIN ASPART 100 UNIT/ML ~~LOC~~ SOLN
0.0000 [IU] | Freq: Three times a day (TID) | SUBCUTANEOUS | Status: DC
  Administered 2015-09-17: 5 [IU] via SUBCUTANEOUS

## 2015-09-17 MED ORDER — ENOXAPARIN SODIUM 40 MG/0.4ML ~~LOC~~ SOLN
40.0000 mg | SUBCUTANEOUS | Status: DC
  Administered 2015-09-17: 40 mg via SUBCUTANEOUS
  Filled 2015-09-17: qty 0.4

## 2015-09-17 MED ORDER — OXYCODONE HCL 5 MG PO TABS: 5.0000 mg | ORAL_TABLET | Freq: Four times a day (QID) | ORAL | Status: DC | PRN

## 2015-09-17 NOTE — Progress Notes (Signed)
Subjective: 5 Days Post-Op Procedure(s) (LRB): IRRIGATION AND DEBRIDEMENT KNEE WITH POLY EXCHANGE LEFT TOTAL KNEE (Left) Patient reports pain as 3 on 0-10 scale.    Objective: Vital signs in last 24 hours: Temp:  [98.2 F (36.8 C)-98.7 F (37.1 C)] 98.5 F (36.9 C) (03/04 0431) Pulse Rate:  [19-106] 92 (03/04 0431) Resp:  [18-20] 20 (03/04 0431) BP: (127-158)/(67-86) 148/85 mmHg (03/04 0431) SpO2:  [93 %-98 %] 94 % (03/04 0431)  Intake/Output from previous day: 03/03 0701 - 03/04 0700 In: 650 [P.O.:600; IV Piggyback:50] Out: 2475 [Urine:2475] Intake/Output this shift:     Recent Labs  09/15/15 0430 09/16/15 0500 09/17/15 0315  HGB 12.0* 11.8* 11.2*    Recent Labs  09/16/15 0500 09/17/15 0315  WBC 18.6* 14.2*  RBC 4.11* 3.84*  HCT 37.0* 34.8*  PLT 306 311    Recent Labs  09/16/15 0500 09/17/15 0315  NA 140 136  K 3.1* 3.4*  CL 105 101  CO2 24 27  BUN 17 11  CREATININE 0.72 0.56*  GLUCOSE 128* 130*  CALCIUM 8.0* 7.8*   No results for input(s): LABPT, INR in the last 72 hours.  ABD soft Intact pulses distally Incision: dressing C/D/I  Assessment/Plan 5 Days Post-Op Procedure(s) (LRB): IRRIGATION AND DEBRIDEMENT KNEE WITH POLY EXCHANGE LEFT TOTAL KNEE (Left)  Doing well. Continues to improve. Advance diet Up with therapy  Keng Jewel C 09/17/2015, 8:40 AM

## 2015-09-17 NOTE — Clinical Social Work Placement (Signed)
   CLINICAL SOCIAL WORK PLACEMENT  NOTE  Date:  09/17/2015  Patient Details  Name: Hunter Santos MRN: [id number] Date of Birth: September 06, 1950  Clinical Social Work is seeking post-discharge placement for this patient at the [place] level of care (*CSW will initial, date and re-position this form in  chart as items are completed):  Yes   Patient/family provided with [place] Work Department's list of facilities offering this level of care within the geographic area requested by the patient (or if unable, by the patient's family).  Yes   Patient/family informed of their freedom to choose among providers that offer the needed level of care, that participate in Medicare, Medicaid or managed care program needed by the patient, have an available bed and are willing to accept the patient.  Yes   Patient/family informed of [facility]'s ownership interest in [facility] and [facility], as well as of the fact that they are under no obligation to receive care at these facilities.  PASRR submitted to EDS on 09/16/15     PASRR number received on 09/16/15     Existing PASRR number confirmed on       FL2 transmitted to all facilities in geographic area requested by pt/family on 09/16/15     FL2 transmitted to all facilities within larger geographic area on       Patient informed that his/her managed care company has contracts with or will negotiate with certain facilities, including the following:        Yes   Patient/family informed of bed offers received.  Patient chooses bed at [place]     Physician recommends and patient chooses bed at      Patient to be transferred to [facility] on 09/17/15.  Patient to be transferred to facility by [facility]     Patient family notified on 09/17/15 of transfer.  Name of family member notified:  SPOUSE     PHYSICIAN       Additional Comment: Pt / spouse are in  agreement with d/c to [place] today.[employer] has provided authorization. [facility] transport is required. Medical necessity form completed. D/C Summary sent to SNF for review prior to d/c. # for report provided to nsg.   _______________________________________________ Luretha Rued, [place]  [phone] 09/17/2015, 12:42 PM

## 2015-09-17 NOTE — Progress Notes (Addendum)
Physical Therapy Treatment Patient Details Name: Hunter Santos MRN: [id number] DOB: 1950-07-19 Today's Date: 09/17/2015    History of Present Illness Hunter Santos is a 65 y.o. male with a past medical history of diabetes on multiple drugs, hypertension, previous history of left knee prosthesis infection who developed L knee swelling , pain, inability to ambulate and 3 falls at home. Seen by Dr. Alvan Dame and scheduled for I and D 09/12/15. Unfortunaley falls  prompted EMS to bring to ED.2/26/117.    In the emergency department, patient was found to be tachycardic, hypotensive, tachypneic. Lactic acid is 3.5. Patient has high anion gap metabolic acidosis. He is hypokalemic. He appears to have severe sepsis with possible septic shock.     PT Comments    Called to room by nurse tech.  Unable to stand pt from lower level recliner chair.  Used MaxiMove to lift pt from recliner to [facility] for attempted BM.  Flutes only.  Used MaxiMove to assist off BSC then place back to bed.  Increased time to position to comfort.   Pt plans to D/C today to Rehab.   Follow Up Recommendations  SNF     Equipment Recommendations       Recommendations for Other Services       Precautions / Restrictions Precautions Precautions: Knee Precaution Comments: instructed to wear KI for amb for increased support/limited active shoulder ROM Required Braces or Orthoses: Knee Immobilizer - Left Knee Immobilizer - Left: On when out of bed or walking Restrictions Weight Bearing Restrictions: No Other Position/Activity Restrictions: WBAT    Mobility  Bed Mobility Overal bed mobility: Needs Assistance;+2 for physical assistance;+ 2 for safety/equipment Bed Mobility: Supine to Sit     Supine to sit: Max assist;+2 for physical assistance     General bed mobility comments: assisted back to bed + 2 total assist  Transfers Overall transfer level: Needs assistance Equipment used: Rolling walker (2 wheeled) Transfers:  Sit to/from Stand Sit to Stand: Max assist;+2 physical assistance;+2 safety/equipment;From elevated surface         General transfer comment: attempted sit to stand from recliner + 2 assist however level too low and pt unable to clear hipd from surface.  Used MaxiMove to transfer from recliner to [facility] for attempted BM.  Flutes only.  Used MaxiMove to transition from [facility] to bed.    Ambulation/Gait  Stairs            Wheelchair Mobility    Modified Rankin (Stroke Patients Only)       Balance                                    Cognition Arousal/Alertness: Awake/alert Behavior During Therapy: WFL for tasks assessed/performed Overall Cognitive Status: Within Functional Limits for tasks assessed                      Exercises      General Comments        Pertinent Vitals/Pain Pain Assessment: 0-10 Pain Score: 5  Pain Location: L shoulder and L knee Pain Descriptors / Indicators: Aching;Grimacing Pain Intervention(s): Monitored during session;Repositioned;Ice applied;Patient requesting pain meds-RN notified    Home Living                      Prior Function            PT Goals (  current goals can now be found in the care plan section) Progress towards PT goals: Progressing toward goals    Frequency  7X/week    PT Plan Current plan remains appropriate    Co-evaluation             End of Session Equipment Utilized During Treatment: Gait belt Activity Tolerance: Patient limited by pain Patient left: in chair;with call bell/phone within reach;with chair alarm set     Time: 1355-1425 PT Time Calculation (min) (ACUTE ONLY): 30 min  Charges:   $Therapeutic Activity: 23-37 mins                    G Codes:      Rica Koyanagi  PTA [facility]  Acute  Rehab Pager      [phone]

## 2015-09-17 NOTE — Discharge Instructions (Signed)
Follow with Primary MD Dwan Bolt, MD after discharge from SNF  Get CBC, CMP, checked  by Primary MD next visit.    Activity: As tolerated with Full fall precautions use walker/cane & assistance as needed   Disposition SNF   Diet: Heart Healthy , carbohydrate modified , with feeding assistance and aspiration precautions.  For Heart failure patients - Check your Weight same time everyday, if you gain over 2 pounds, or you develop in leg swelling, experience more shortness of breath or chest pain, call your Primary MD immediately. Follow Cardiac Low Salt Diet and 1.5 lit/day fluid restriction.   On your next visit with your primary care physician please Get Medicines reviewed and adjusted.   Please request your Prim.MD to go over all Hospital Tests and Procedure/Radiological results at the follow up, please get all Hospital records sent to your Prim MD by signing hospital release before you go home.   If you experience worsening of your admission symptoms, develop shortness of breath, life threatening emergency, suicidal or homicidal thoughts you must seek medical attention immediately by calling 911 or calling your MD immediately  if symptoms less severe.  You Must read complete instructions/literature along with all the possible adverse reactions/side effects for all the Medicines you take and that have been prescribed to you. Take any new Medicines after you have completely understood and accpet all the possible adverse reactions/side effects.   Do not drive, operating heavy machinery, perform activities at heights, swimming or participation in water activities or provide baby sitting services if your were admitted for syncope or siezures until you have seen by Primary MD or a Neurologist and advised to do so again.  Do not drive when taking Pain medications.    Do not take more than prescribed Pain, Sleep and Anxiety Medications  Special Instructions: If you have smoked or  chewed Tobacco  in the last 2 yrs please stop smoking, stop any regular Alcohol  and or any Recreational drug use.  Wear Seat belts while driving.   Please note  You were cared for by a hospitalist during your hospital stay. If you have any questions about your discharge medications or the care you received while you were in the hospital after you are discharged, you can call the unit and asked to speak with the hospitalist on call if the hospitalist that took care of you is not available. Once you are discharged, your primary care physician will handle any further medical issues. Please note that NO REFILLS for any discharge medications will be authorized once you are discharged, as it is imperative that you return to your primary care physician (or establish a relationship with a primary care physician if you do not have one) for your aftercare needs so that they can reassess your need for medications and monitor your lab values.

## 2015-09-17 NOTE — Progress Notes (Signed)
Spoke with Cleophas Dunker, PA regarding patient's PICC line not being heparinized prior to being discharged to [facility] this afternoon.

## 2015-09-17 NOTE — Discharge Summary (Signed)
Hunter Santos, is a 65 y.o. male  DOB May 08, 1951  MRN [id number].  Admission date:  09/11/2015  Admitting Physician  Bonnielee Haff, MD  Discharge Date:  09/17/2015   Primary MD  Dwan Bolt, MD  Recommendations for primary care physician for things to follow:  - Patient needs to follow with orthopedic Dr. Alvan Dame in 2 weeks from discharge - Check CBC, BMP in 3 days - Patient to continue with IV Rocephin until 10/22/2015   Admission Diagnosis  Fever [R50.9]   Discharge Diagnosis  Fever [R50.9]    Principal Problem:   Infection of prosthetic left knee joint ([place]) Active Problems:   S/P left knee resection, abx spacer   DM (diabetes mellitus) ([place])   S/P revision of total knee   Septic shock ([place])   High anion gap metabolic acidosis   Acute renal failure (ARF) ([facility])   Hypokalemia   Severe sepsis ([facility])   Lactic acid acidosis   Streptococcal bacteremia      Past Medical History  Diagnosis Date  . Hypertension   . Diabetes mellitus   . Arthritis   . History of transfusion   . Sleep apnea     had surgery for deviated septum previously    Past Surgical History  Procedure Laterality Date  . Tonsillectomy      as child  . Left ankle  2006  . Nasal septum surgery  2011  . Total knee arthroplasty  04/29/2012    Procedure: TOTAL KNEE ARTHROPLASTY;  Surgeon: Mauri Pole, MD;  Location: [facility];  Service: Orthopedics;  Laterality: Left;  . Excisional total knee arthroplasty Left 06/15/2013    Procedure: LEFT RESECTION TOTAL KNEE ARTHROPLASTY WITH PLACEMENT OF ANTIBIOTIC SPACERS;  Surgeon: Mauri Pole, MD;  Location: [facility];  Service: Orthopedics;  Laterality: Left;  . Total knee revision Left 08/17/2013    Procedure: REIMPLANTATION LEFT TOTAL KNEE ARTHROPLASTY WITH REMOVAL OF ANTIBIOTIC SPACER ;  Surgeon: Mauri Pole, MD;  Location: [facility];  Service: Orthopedics;  Laterality: Left;   . I&d knee with poly exchange Left 09/12/2015    Procedure: IRRIGATION AND DEBRIDEMENT KNEE WITH POLY EXCHANGE LEFT TOTAL KNEE;  Surgeon: Paralee Cancel, MD;  Location: [facility];  Service: Orthopedics;  Laterality: Left;       History of present illness and  Hospital Course:     Kindly see H&P for history of present illness and admission details, please review complete Labs, Consult reports and Test reports for all details in brief  HPI  from the history and physical done on the day of admission 09/11/2015 HPI: Hunter Santos is a 65 y.o. male with a past medical history of diabetes on multiple drugs, hypertension, previous history of left knee prosthesis infection who was in his usual state of health till about a week and a half ago when he was working outside his house. He felt weak and he was going up the stairs when he fell and scraped his knee. And then about a  week ago he started developed pain in the left knee. The knee started swelling. He had difficulty walking. He subsequently went to see his orthopedic doctor on Thursday or Friday. Apparently, they performed x-rays of the knee. No fractures were noted. The knee was noted to be swollen. Arthrocentesis was done and apparently raised concern for infection. So the patient was scheduled for irrigation of that joint on Monday. However, this morning, patient felt extremely weak and he couldn't get up. He couldn't ambulate. And so EMS was called. Patient denies any nausea, vomiting, dizziness, lightheadedness. No passing out spells. No cough, shortness of breath. No difficulty with urination. No diarrhea. He appears to be distracted. His wife is at the bedside.  In the emergency department, patient was found to be tachycardic, hypotensive, tachypneic. Lactic acid is 3.5. Patient has high anion gap metabolic acidosis. He is hypokalemic. He appears to have severe sepsis with possible septic shock. He will need to be hospitalized for further  Hunter Santos, 65 y.o. male, has a history of infection in the left total knee arthroplasty. S/p 6 weeks of IV antibiotics in 2015. In early February of 2015 he underwent revision of the total knee arthroplasty and reimplantation of a total knee arthroplasty after removal of antibiotic spacer. And then about a week ago he started developed pain in the left knee. The knee started swelling. He had difficulty walking. He subsequently went to see his orthopedic doctor on Thursday or Friday. Apparently, they performed x-rays of the knee. No fractures were noted. The knee was noted to be swollen. Arthrocentesis was done and apparently raised concern for infection, seen by orthopedic, he went for surgical irrigation and debridement knee and poly-exchange on 2/27, seen by ID regarding antibiotics recommendation.  Severe sepsis secondary to left knee infection/group B streptococcal bacteremia -Sepsis protocol has been initiated, significantly improved, lactic acid normalized, cytosis is improving. -has aspiration of knee Orthopedic office that grew: aspiration in the office that he had a beta-hemolytic strep  -Blood cultures : Group B streptococcus agalactiae - Status post left knee IRRIGATION AND DEBRIDEMENT KNEE WITH POLY EXCHANGE LEFT TOTAL KNEE by Dr. Alvan Dame 2/27 - ID consult greatly appreciated, initially on IV vancomycin, transitioned to IV Rocephin on 2/28, and to continue for 6 week 10/22/2015  Group B streptococcal bacteremia -  due to left knee infection  High anion gap metabolic acidosis: -resolved with IVF  Left shoulder pain - xray was significant for osteoarthritis, bursitis, and chronic rotator cough tear , continue with OT  history of diabetes mellitus type 2:  -HgbA1C is 8.1 , resume metformin, Amaryl, will start on insulin sliding scale.  Hypokalemia -repleted,   history of essential hypertension:  - human home medication on discharge    Mild acute renal failure  -Resolved   Discharge Condition:  Stable    Follow UP  Follow-up Information    Follow up with Mauri Pole, MD. Schedule an appointment as soon as possible for a visit in 2 weeks.   Specialty:  Orthopedic Surgery   Contact information:   [address] [place] [address] [phone]       Follow up with Dwan Bolt, MD.   Specialty:  Endocrinology   Contact information:   [address] [place] [address] [phone]         Discharge Instructions  and  Discharge Medications     Discharge Instructions    Discharge instructions  Complete by:  As directed   Follow with Primary MD Dwan Bolt, MD after discharge from SNF  Get CBC, CMP, checked  by Primary MD next visit.    Activity: As tolerated with Full fall precautions use walker/cane & assistance as needed   Disposition SNF   Diet: Heart Healthy , carbohydrate modified , with feeding assistance and aspiration precautions.  For Heart failure patients - Check your Weight same time everyday, if you gain over 2 pounds, or you develop in leg swelling, experience more shortness of breath or chest pain, call your Primary MD immediately. Follow Cardiac Low Salt Diet and 1.5 lit/day fluid restriction.   On your next visit with your primary care physician please Get Medicines reviewed and adjusted.   Please request your Prim.MD to go over all Hospital Tests and Procedure/Radiological results at the follow up, please get all Hospital records sent to your Prim MD by signing hospital release before you go home.   If you experience worsening of your admission symptoms, develop shortness of breath, life threatening emergency, suicidal or homicidal thoughts you must seek medical attention immediately by calling 911 or calling your MD immediately  if symptoms less severe.  You Must read complete instructions/literature along with all  the possible adverse reactions/side effects for all the Medicines you take and that have been prescribed to you. Take any new Medicines after you have completely understood and accpet all the possible adverse reactions/side effects.   Do not drive, operating heavy machinery, perform activities at heights, swimming or participation in water activities or provide baby sitting services if your were admitted for syncope or siezures until you have seen by Primary MD or a Neurologist and advised to do so again.  Do not drive when taking Pain medications.    Do not take more than prescribed Pain, Sleep and Anxiety Medications  Special Instructions: If you have smoked or chewed Tobacco  in the last 2 yrs please stop smoking, stop any regular Alcohol  and or any Recreational drug use.  Wear Seat belts while driving.   Please note  You were cared for by a hospitalist during your hospital stay. If you have any questions about your discharge medications or the care you received while you were in the hospital after you are discharged, you can call the unit and asked to speak with the hospitalist on call if the hospitalist that took care of you is not available. Once you are discharged, your primary care physician will handle any further medical issues. Please note that NO REFILLS for any discharge medications will be authorized once you are discharged, as it is imperative that you return to your primary care physician (or establish a relationship with a primary care physician if you do not have one) for your aftercare needs so that they can reassess your need for medications and monitor your lab values.            Medication List    STOP taking these medications        GOODYS BODY PAIN PO     JARDIANCE 25 MG Tabs tablet  Generic drug:  empagliflozin     TRULICITY 1.5 0000000 Sopn  Generic drug:  Dulaglutide      TAKE these medications        acetaminophen 325 MG tablet  Commonly known as:   TYLENOL  Take 2 tablets (650 mg total) by mouth every 6 (six) hours as needed for mild pain (or Fever >/=  101).     amLODipine 10 MG tablet  Commonly known as:  NORVASC  Take 10 mg by mouth daily before breakfast.     aspirin 81 MG tablet  Take 81 mg by mouth daily.     BLUE-EMU MAXIMUM STRENGTH EX  Apply 1 application topically 2 (two) times daily as needed (muscle strain). Patient uses on shoulders     cefTRIAXone 2 g in dextrose 5 % 50 mL  Inject 2 g into the vein daily. Continue until 10/22/2015     enoxaparin 40 MG/0.4ML injection  Commonly known as:  LOVENOX  Inject 0.4 mLs (40 mg total) into the skin daily. To continue for 30 days for DVT prophylaxis then stop.     furosemide 40 MG tablet  Commonly known as:  LASIX  Take 40 mg by mouth daily before breakfast.     glimepiride 4 MG tablet  Commonly known as:  AMARYL  Take 4 mg by mouth daily before breakfast.     insulin aspart 100 UNIT/ML injection  Commonly known as:  novoLOG  Inject 0-9 Units into the skin 3 (three) times daily with meals.     metFORMIN 500 MG tablet  Commonly known as:  GLUCOPHAGE  Take 500-1,000 mg by mouth 2 (two) times daily with a meal. Take 1000mg  in the morning, and then 500mg  at night     methocarbamol 500 MG tablet  Commonly known as:  ROBAXIN  Take 1 tablet (500 mg total) by mouth every 8 (eight) hours as needed for muscle spasms.     ondansetron 4 MG tablet  Commonly known as:  ZOFRAN  Take 1 tablet (4 mg total) by mouth every 6 (six) hours as needed for nausea.     oxyCODONE 5 MG immediate release tablet  Commonly known as:  Oxy IR/ROXICODONE  Take 1 tablet (5 mg total) by mouth every 6 (six) hours as needed for severe pain.     phosphorus 155-852-130 MG tablet  Commonly known as:  K PHOS NEUTRAL  Take 1 tablet (250 mg total) by mouth daily.     pravastatin 40 MG tablet  Commonly known as:  PRAVACHOL  Take 40 mg by mouth every evening.     ramipril 10 MG tablet  Commonly  known as:  ALTACE  Take 10 mg by mouth daily before breakfast.          Diet and Activity recommendation: See Discharge Instructions above   Consults obtained -   PCCM  Ortho Dr. Alvan Dame  ID Dr. Megan Salon   Major procedures and Radiology Reports - PLEASE review detailed and final reports for all details, in brief -   Status post left knee IRRIGATION AND DEBRIDEMENT KNEE WITH POLY EXCHANGE LEFT TOTAL KNEE by Dr. Alvan Dame 2/27  Dg Chest Port 1 View  09/11/2015  CLINICAL DATA:  Acute fever and shortness of breath. EXAM: PORTABLE CHEST 1 VIEW COMPARISON:  10/20/2010 chest radiograph FINDINGS: The cardiomediastinal silhouette is unremarkable. There is no evidence of focal airspace disease, pulmonary edema, suspicious pulmonary nodule/mass, pleural effusion, or pneumothorax. No acute bony abnormalities are identified. IMPRESSION: No active disease. Electronically Signed   By: Margarette Canada M.D.   On: 09/11/2015 14:02   Dg Shoulder Left  09/14/2015  CLINICAL DATA:  Left shoulder pain for lower for 1 week. No known injury. Initial encounter. EXAM: LEFT SHOULDER - 2+ VIEW COMPARISON:  None. FINDINGS: No acute bony or joint abnormality is identified. The humeral head is high-riding suggestive  of chronic rotator cuff tear. There is glenohumeral and acromioclavicular osteoarthritis. A calcification is seen projecting just lateral to the acromion and is likely in the subacromial/subdeltoid bursa. Imaged left lung and ribs appear normal. IMPRESSION: No acute abnormality. Acromioclavicular and glenohumeral osteoarthritis. Findings suggestive of chronic rotator cuff tear. Finding suggestive of calcific subacromial/subdeltoid bursitis. Electronically Signed   By: Inge Rise M.D.   On: 09/14/2015 18:50    Micro Results     Recent Results (from the past 240 hour(s))  Blood Culture (routine x 2)     Status: None   Collection Time: 09/11/15 11:00 AM  Result Value Ref Range Status   Specimen Description  BLOOD RIGHT ANTECUBITAL  Final   Special Requests BOTTLES DRAWN AEROBIC AND ANAEROBIC 5CC  Final   Culture  Setup Time   Final    GRAM POSITIVE COCCI IN PAIRS AND CHAINS IN BOTH AEROBIC AND ANAEROBIC BOTTLES CRITICAL RESULT CALLED TO, READ BACK BY AND VERIFIED WITH: A CHAVZE,RN [id number] [address] [place]    Culture   Final    STREPTOCOCCUS AGALACTIAE Performed at [facility]    Report Status 09/14/2015 FINAL  Final   Organism ID, Bacteria STREPTOCOCCUS AGALACTIAE  Final      Susceptibility   Streptococcus agalactiae - MIC*    CLINDAMYCIN <=0.25 SENSITIVE Sensitive     AMPICILLIN <=0.25 SENSITIVE Sensitive     ERYTHROMYCIN 2 RESISTANT Resistant     VANCOMYCIN 0.5 SENSITIVE Sensitive     CEFTRIAXONE <=0.12 SENSITIVE Sensitive     LEVOFLOXACIN 2 SENSITIVE Sensitive     * STREPTOCOCCUS AGALACTIAE  Blood Culture (routine x 2)     Status: None   Collection Time: 09/11/15 11:05 AM  Result Value Ref Range Status   Specimen Description BLOOD BLOOD RIGHT FOREARM  Final   Special Requests BOTTLES DRAWN AEROBIC AND ANAEROBIC 5CC  Final   Culture  Setup Time   Final    GRAM POSITIVE COCCI IN PAIRS AND CHAINS IN BOTH AEROBIC AND ANAEROBIC BOTTLES CRITICAL RESULT CALLED TO, READ BACK BY AND VERIFIED WITH: A CHAVZE,RN ID:[id number] [address] [place]    Culture   Final    GROUP B STREP(S.AGALACTIAE)ISOLATED SUSCEPTIBILITIES PERFORMED ON PREVIOUS CULTURE WITHIN THE LAST 5 DAYS. Performed at [facility]    Report Status 09/14/2015 FINAL  Final  Urine culture     Status: None   Collection Time: 09/11/15  1:38 PM  Result Value Ref Range Status   Specimen Description URINE, CLEAN CATCH  Final   Special Requests NONE  Final   Culture   Final    MULTIPLE SPECIES PRESENT, SUGGEST RECOLLECTION Performed at [facility]    Report Status 09/12/2015 FINAL  Final  MRSA PCR Screening     Status: Abnormal   Collection Time: 09/11/15  4:28 PM  Result Value Ref Range Status   MRSA by PCR  POSITIVE (A) NEGATIVE Final    Comment:        The GeneXpert MRSA Assay (FDA approved for NASAL specimens only), is one component of a comprehensive MRSA colonization surveillance program. It is not intended to diagnose MRSA infection nor to guide or monitor treatment for MRSA infections. RESULT CALLED TO, READ BACK BY AND VERIFIED WITH: Q.[email or web address],RN AT 2014 ON 09/11/15 BY W.SHEA   Anaerobic culture     Status: None (Preliminary result)   Collection Time: 09/12/15  4:31 PM  Result Value Ref Range Status   Specimen Description SYNOVIAL LEFT KNEE  Final  Special Requests NONE  Final   Gram Stain   Final    ABUNDANT WBC PRESENT,BOTH PMN AND MONONUCLEAR ABUNDANT GRAM POSITIVE COCCI IN PAIRS IN CHAINS    Culture   Final    NO ANAEROBES ISOLATED; CULTURE IN PROGRESS FOR 5 DAYS Performed at [facility]    Report Status PENDING  Incomplete  Body fluid culture     Status: None   Collection Time: 09/12/15  4:31 PM  Result Value Ref Range Status   Specimen Description SYNOVIAL FLUID LEFT KNEE  Final   Special Requests PATIENT ON FOLLOWING VANC AND ZOSYN  Final   Gram Stain   Final    ABUNDANT WBC PRESENT,BOTH PMN AND MONONUCLEAR ABUNDANT GRAM POSITIVE COCCI IN PAIRS IN CHAINS Gram Stain Report Called to,Read Back By and Verified With: T PALEN AT 1815 ON 02.27.2017 BY NBROOKS     Culture   Final    GROUP B STREP(S.AGALACTIAE)ISOLATED Performed at [facility]    Report Status 09/15/2015 FINAL  Final   Organism ID, Bacteria GROUP B STREP(S.AGALACTIAE)ISOLATED  Final      Susceptibility   Group b strep(s.agalactiae)isolated - MIC*    CLINDAMYCIN <=0.25 SENSITIVE Sensitive     AMPICILLIN <=0.25 SENSITIVE Sensitive     ERYTHROMYCIN 2 RESISTANT Resistant     VANCOMYCIN 0.5 SENSITIVE Sensitive     CEFTRIAXONE <=0.12 SENSITIVE Sensitive     LEVOFLOXACIN 1 SENSITIVE Sensitive     * GROUP B STREP(S.AGALACTIAE)ISOLATED  Culture, blood (routine x 2)     Status: None  (Preliminary result)   Collection Time: 09/13/15  7:55 AM  Result Value Ref Range Status   Specimen Description BLOOD LEFT ARM  Final   Special Requests BOTTLES DRAWN AEROBIC AND ANAEROBIC 10 CC  Final   Culture   Final    NO GROWTH 3 DAYS Performed at [facility]    Report Status PENDING  Incomplete  Culture, blood (routine x 2)     Status: None (Preliminary result)   Collection Time: 09/13/15  7:55 AM  Result Value Ref Range Status   Specimen Description BLOOD LEFT HAND  Final   Special Requests BOTTLES DRAWN AEROBIC AND ANAEROBIC 10CC  Final   Culture   Final    NO GROWTH 3 DAYS Performed at [facility]    Report Status PENDING  Incomplete       Today   Subjective:   Joya Salm today Denies any fever or chills, no chest pain, abdominal pain, nausea or vomiting  Objective:   Blood pressure 148/85, pulse 92, temperature 98.5 F (36.9 C), temperature source Oral, resp. rate 20, height 6\' 2"  (1.88 m), weight 119.2 kg (262 lb 12.6 oz), SpO2 94 %.   Intake/Output Summary (Last 24 hours) at 09/17/15 1122 Last data filed at 09/17/15 0900  Gross per 24 hour  Intake    770 ml  Output   2025 ml  Net  -1255 ml    Exam  General: Awake, NAD, with PT.  Cardiovascular: rrr, no murmur rubs or gallops  Respiratory: clear to auscultation bilaterally, good air entry  Abdomen: +BS, soft, nontender, nondistended  Musculoskeletal: left perineal surgical scar covered by dressing , mild pitting edema bilaterally   Data Review   CBC w Diff: Lab Results  Component Value Date   WBC 14.2* 09/17/2015   HGB 11.2* 09/17/2015   HCT 34.8* 09/17/2015   PLT 311 09/17/2015   LYMPHOPCT 10 09/15/2015   MONOPCT 8  09/15/2015   EOSPCT 1 09/15/2015   BASOPCT 0 09/15/2015    CMP: Lab Results  Component Value Date   NA 136 09/17/2015   K 3.4* 09/17/2015   CL 101 09/17/2015   CO2 27 09/17/2015   BUN 11 09/17/2015   CREATININE 0.56* 09/17/2015   PROT 5.4*  09/11/2015   ALBUMIN 2.2* 09/11/2015   BILITOT 1.8* 09/11/2015   ALKPHOS 70 09/11/2015   AST 78* 09/11/2015   ALT 35 09/11/2015  .   Total Time in preparing paper work, data evaluation and todays exam - 35 minutes  Bj Morlock M.D on 09/17/2015 at [place]  [phone]

## 2015-09-17 NOTE — Progress Notes (Signed)
IV Team called to report that patient's PICC line was not heparinized prior to discharge to [facility]. Attempted to contact facility regarding this issue; phone rang and rang and and then I was disconnected. Will attempt to contact facility again.

## 2015-09-17 NOTE — Progress Notes (Signed)
Attempted once again to contact [facility] regarding patient's PICC line status. No answer.

## 2015-09-17 NOTE — Progress Notes (Signed)
Physical Therapy Treatment Patient Details Name: Hunter Santos MRN: [id number] DOB: [birth year] Today's Date: 09/17/2015    History of Present Illness Hunter Santos is a 65 y.o. male with a past medical history of diabetes on multiple drugs, hypertension, previous history of left knee prosthesis infection who developed L knee swelling , pain, inability to ambulate and 3 falls at home. Seen by Dr. Alvan Dame and scheduled for I and D 09/12/15. Unfortunaley falls  prompted EMS to bring to ED.2/26/117.    In the emergency department, patient was found to be tachycardic, hypotensive, tachypneic. Lactic acid is 3.5. Patient has high anion gap metabolic acidosis. He is hypokalemic. He appears to have severe sepsis with possible septic shock.     PT Comments    POD # 5 Applied KI and instructed on use for amb for increased support due to B shoulder Hx.  Assisted OOB to amb a limited distance due to pain level L knee and L shoulder.  Recliner following closely behind for safety.    Follow Up Recommendations  SNF     Equipment Recommendations       Recommendations for Other Services       Precautions / Restrictions Precautions Precautions: Knee Precaution Comments: instructed to wear KI for amb for increased support/limited active shoulder ROM Required Braces or Orthoses: Knee Immobilizer - Left Knee Immobilizer - Left: On when out of bed or walking Restrictions Weight Bearing Restrictions: No Other Position/Activity Restrictions: WBAT    Mobility  Bed Mobility Overal bed mobility: Needs Assistance;+2 for physical assistance;+ 2 for safety/equipment Bed Mobility: Supine to Sit     Supine to sit: Max assist;+2 for physical assistance     General bed mobility comments: assist for legs and trunk.  elevated HOB to assist.    Transfers Overall transfer level: Needs assistance Equipment used: Rolling walker (2 wheeled) Transfers: Sit to/from Stand Sit to Stand: Max assist;+2 physical  assistance;+2 safety/equipment;From elevated surface         General transfer comment: elevated bed and 25% VC's on proper tech/safety   Ambulation/Gait Ambulation/Gait assistance: Mod assist;+2 physical assistance;+2 safety/equipment Ambulation Distance (Feet): 5 Feet Assistive device: Rolling walker (2 wheeled) Gait Pattern/deviations: Step-to pattern Gait velocity: decreased   General Gait Details: decreased amb distance due to increased c/o L knee and L shoulder pain    Stairs            Wheelchair Mobility    Modified Rankin (Stroke Patients Only)       Balance                                    Cognition Arousal/Alertness: Awake/alert Behavior During Therapy: WFL for tasks assessed/performed Overall Cognitive Status: Within Functional Limits for tasks assessed                      Exercises      General Comments        Pertinent Vitals/Pain Pain Assessment: 0-10 Pain Score: 7  Pain Location: L shoulder and L knee Pain Descriptors / Indicators: Aching;Grimacing Pain Intervention(s): Monitored during session;Repositioned;Ice applied;Patient requesting pain meds-RN notified    Home Living                      Prior Function            PT Goals (current goals can now be  found in the care plan section) Progress towards PT goals: Progressing toward goals    Frequency  7X/week    PT Plan Current plan remains appropriate    Co-evaluation             End of Session Equipment Utilized During Treatment: Gait belt Activity Tolerance: Patient limited by pain Patient left: in chair;with call bell/phone within reach;with chair alarm set     Time: 1040-1110 PT Time Calculation (min) (ACUTE ONLY): 30 min  Charges:  $Gait Training: 8-22 mins $Therapeutic Activity: 8-22 mins                    G Codes:      Rica Koyanagi  PTA [facility]  Acute  Rehab Pager      [phone]

## 2015-09-18 LAB — ANAEROBIC CULTURE

## 2015-09-18 LAB — GLUCOSE, CAPILLARY: Glucose-Capillary: 295 mg/dL — ABNORMAL HIGH (ref 65–99)

## 2015-09-18 LAB — CULTURE, BLOOD (ROUTINE X 2)
CULTURE: NO GROWTH
Culture: NO GROWTH

## 2015-09-19 ENCOUNTER — Non-Acute Institutional Stay (SKILLED_NURSING_FACILITY): Payer: 59 | Admitting: Internal Medicine

## 2015-09-19 ENCOUNTER — Encounter: Payer: Self-pay | Admitting: Internal Medicine

## 2015-09-19 DIAGNOSIS — E8729 Other acidosis: Secondary | ICD-10-CM

## 2015-09-19 DIAGNOSIS — R6520 Severe sepsis without septic shock: Secondary | ICD-10-CM

## 2015-09-19 DIAGNOSIS — B955 Unspecified streptococcus as the cause of diseases classified elsewhere: Secondary | ICD-10-CM

## 2015-09-19 DIAGNOSIS — M25512 Pain in left shoulder: Secondary | ICD-10-CM

## 2015-09-19 DIAGNOSIS — E0810 Diabetes mellitus due to underlying condition with ketoacidosis without coma: Secondary | ICD-10-CM | POA: Diagnosis not present

## 2015-09-19 DIAGNOSIS — D62 Acute posthemorrhagic anemia: Secondary | ICD-10-CM

## 2015-09-19 DIAGNOSIS — I10 Essential (primary) hypertension: Secondary | ICD-10-CM | POA: Diagnosis not present

## 2015-09-19 DIAGNOSIS — E785 Hyperlipidemia, unspecified: Secondary | ICD-10-CM

## 2015-09-19 DIAGNOSIS — N179 Acute kidney failure, unspecified: Secondary | ICD-10-CM | POA: Diagnosis not present

## 2015-09-19 DIAGNOSIS — T8454XA Infection and inflammatory reaction due to internal left knee prosthesis, initial encounter: Secondary | ICD-10-CM | POA: Diagnosis not present

## 2015-09-19 DIAGNOSIS — B954 Other streptococcus as the cause of diseases classified elsewhere: Secondary | ICD-10-CM | POA: Diagnosis not present

## 2015-09-19 DIAGNOSIS — A419 Sepsis, unspecified organism: Secondary | ICD-10-CM

## 2015-09-19 DIAGNOSIS — E872 Acidosis: Secondary | ICD-10-CM | POA: Diagnosis not present

## 2015-09-19 DIAGNOSIS — R7881 Bacteremia: Secondary | ICD-10-CM

## 2015-09-19 NOTE — Progress Notes (Signed)
MRN: [id number] Name: Hunter Santos  Sex: male Age: 65 y.o. DOB: Mar 02, 1951  [place] #: Karren Burly Facility/Room:103 Level Of Care: SNF Provider: Inocencio Homes D Emergency Contacts: Extended Emergency Contact Information Primary Emergency Contact: Weninger,Stephanie Address: [address]          [place], [place] [address] Johnnette Litter of [place] Phone: [phone] Work Phone: [phone] Mobile Phone: [phone] Relation: Spouse  Code Status:   Allergies: Review of patient's allergies indicates no known allergies.  Chief Complaint  Patient presents with  . New Admit To SNF    HPI: Patient is 65 y.o. male whohistory of diabetes on multiple drugs, hypertension, previous history of left knee prosthesis infection who was in his usual state of health till about a week and a half ago when he was working outside his house. He felt weak and he was going up the stairs when he fell and scraped his knee. And then about a week ago he started developed pain in the left knee. The knee started swelling. He had difficulty walking. He subsequently went to see his orthopedic doctor on Thursday or Friday. Apparently, they performed x-rays of the knee. No fractures were noted. The knee was noted to be swollen. Arthrocentesis was done and apparently raised concern for infection. So the patient was scheduled for irrigation of that joint on Monday. However, this morning, patient felt extremely weak and he couldn't get up.In the emergency department, patient was found to be tachycardic, hypotensive, tachypneic. Lactic acid is 3.5. Patient has high anion gap metabolic acidosis. He is hypokalemic. He appears to have severe sepsis with possible septic shock. Pt was admitted to [facility] form 2/26-3/4 where he was treated for group B strep sepsis 2/2 to knee infection.pt has history of infection in the left total knee arthroplasty. S/p 6 weeks of IV antibiotics in 2015. In early February of 2016 he underwent  revision of the total knee arthroplasty and reimplantation of a total knee arthroplasty after removal of antibiotic spacer. And then about a week ago he started developed pain in the left knee. Pt underwent extensive surgical ittigation and debridement. Pt is admitted to SNF for OT/PT and IV antibiotic until 10/20/2015. While at [facility] pt will be followed for DM2, tx with glucophage, amaryl and insulin, HTN, tx with norvasc, lasix and altace and HLD, tx with pravachol.  Past Medical History  Diagnosis Date  . Hypertension   . Diabetes mellitus   . Arthritis   . History of transfusion   . Sleep apnea     had surgery for deviated septum previously    Past Surgical History  Procedure Laterality Date  . Tonsillectomy      as child  . Left ankle  2006  . Nasal septum surgery  2011  . Total knee arthroplasty  04/29/2012    Procedure: TOTAL KNEE ARTHROPLASTY;  Surgeon: Mauri Pole, MD;  Location: [facility]S;  Service: Orthopedics;  Laterality: Left;  . Excisional total knee arthroplasty Left 06/15/2013    Procedure: LEFT RESECTION TOTAL KNEE ARTHROPLASTY WITH PLACEMENT OF ANTIBIOTIC SPACERS;  Surgeon: Mauri Pole, MD;  Location: [facility];  Service: Orthopedics;  Laterality: Left;  . Total knee revision Left 08/17/2013    Procedure: REIMPLANTATION LEFT TOTAL KNEE ARTHROPLASTY WITH REMOVAL OF ANTIBIOTIC SPACER ;  Surgeon: Mauri Pole, MD;  Location: [facility];  Service: Orthopedics;  Laterality: Left;  . I&d knee with poly exchange Left 09/12/2015    Procedure: IRRIGATION AND DEBRIDEMENT KNEE WITH POLY  EXCHANGE LEFT TOTAL KNEE;  Surgeon: Paralee Cancel, MD;  Location: [facility];  Service: Orthopedics;  Laterality: Left;      Medication List       This list is accurate as of: 09/19/15 11:59 PM.  Always use your most recent med list.               acetaminophen 325 MG tablet  Commonly known as:  TYLENOL  Take 2 tablets (650 mg total) by mouth every 6 (six) hours as needed for mild pain (or Fever >/=  101).     amLODipine 10 MG tablet  Commonly known as:  NORVASC  Take 10 mg by mouth daily before breakfast.     aspirin 81 MG tablet  Take 81 mg by mouth daily.     BLUE-EMU MAXIMUM STRENGTH EX  Apply 1 application topically 2 (two) times daily as needed (muscle strain). Patient uses on shoulders     cefTRIAXone 2 g in dextrose 5 % 50 mL  Inject 2 g into the vein daily. Continue until 10/22/2015     enoxaparin 40 MG/0.4ML injection  Commonly known as:  LOVENOX  Inject 0.4 mLs (40 mg total) into the skin daily. To continue for 30 days for DVT prophylaxis then stop.     furosemide 40 MG tablet  Commonly known as:  LASIX  Take 40 mg by mouth daily before breakfast.     glimepiride 4 MG tablet  Commonly known as:  AMARYL  Take 4 mg by mouth daily before breakfast.     insulin aspart 100 UNIT/ML injection  Commonly known as:  novoLOG  Inject 0-9 Units into the skin 3 (three) times daily with meals.     metFORMIN 500 MG tablet  Commonly known as:  GLUCOPHAGE  Take 500-1,000 mg by mouth 2 (two) times daily with a meal. Take 1000mg  in the morning, and then 500mg  at night     methocarbamol 500 MG tablet  Commonly known as:  ROBAXIN  Take 1 tablet (500 mg total) by mouth every 8 (eight) hours as needed for muscle spasms.     ondansetron 4 MG tablet  Commonly known as:  ZOFRAN  Take 1 tablet (4 mg total) by mouth every 6 (six) hours as needed for nausea.     oxyCODONE 5 MG immediate release tablet  Commonly known as:  Oxy IR/ROXICODONE  Take 1 tablet (5 mg total) by mouth every 6 (six) hours as needed for severe pain.     phosphorus 155-852-130 MG tablet  Commonly known as:  K PHOS NEUTRAL  Take 1 tablet (250 mg total) by mouth daily.     pravastatin 40 MG tablet  Commonly known as:  PRAVACHOL  Take 40 mg by mouth every evening.     ramipril 10 MG tablet  Commonly known as:  ALTACE  Take 10 mg by mouth daily before breakfast.        No orders of the defined types  were placed in this encounter.     There is no immunization history on file for this patient.  Social History  Substance Use Topics  . Smoking status: Never Smoker   . Smokeless tobacco: Never Used  . Alcohol Use: No    Family history is + lung CA, alzheimers   Review of Systems  DATA OBTAINED: from patient, nurse GENERAL:  no fevers, fatigue, appetite changes SKIN: No itching, rash or wounds EYES: No eye pain, redness, discharge EARS: No earache, tinnitus,  change in hearing NOSE: No congestion, drainage or bleeding  MOUTH/THROAT: No mouth or tooth pain, No sore throat RESPIRATORY: No cough, wheezing, SOB CARDIAC: No chest pain, palpitations, lower extremity edema  GI: No abdominal pain, No N/V/D or constipation, No heartburn or reflux  GU: No dysuria, frequency or urgency, or incontinence  MUSCULOSKELETAL: would like something for muscle spasms NEUROLOGIC: No headache, dizziness or focal weakness PSYCHIATRIC: No c/o anxiety or sadness   Filed Vitals:   09/19/15 1441  BP: 127/75  Pulse: 94  Temp: 98 F (36.7 C)  Resp: 17    SpO2 Readings from Last 1 Encounters:  09/19/15 94%        Physical Exam  GENERAL APPEARANCE: Alert, conversant,  No acute distress.  SKIN: L knee is warm and swollen but not more than expected for s/p surgery HEAD: Normocephalic, atraumatic  EYES: Conjunctiva/lids clear. Pupils round, reactive. EOMs intact.  EARS: External exam WNL, canals clear. Hearing grossly normal.  NOSE: No deformity or discharge.  MOUTH/THROAT: Lips w/o lesions  RESPIRATORY: Breathing is even, unlabored. Lung sounds are clear   CARDIOVASCULAR: Heart RRR no murmurs, rubs or gallops. 1+ peripheral edema LLE  GASTROINTESTINAL: Abdomen is soft, non-tender, not distended w/ normal bowel sounds. GENITOURINARY: Bladder non tender, not distended  MUSCULOSKELETAL: No abnormal joints or musculature NEUROLOGIC:  Cranial nerves 2-12 grossly intact. Moves all extremities   PSYCHIATRIC: Mood and affect appropriate to situation, no behavioral issues  Patient Active Problem List   Diagnosis Date Noted  . Shoulder pain, left 09/20/2015  . Hyperlipidemia 09/20/2015  . Streptococcal bacteremia 09/12/2015  . Septic shock ([place]) 09/11/2015  . High anion gap metabolic acidosis [id number]  . Acute renal failure (ARF) ([place]) 09/11/2015  . Hypokalemia 09/11/2015  . Severe sepsis ([place]) 09/11/2015  . Lactic acid acidosis 09/11/2015  . S/P revision of total knee 08/17/2013  . DM (diabetes mellitus) ([place]) 06/16/2013  . HTN (hypertension) 06/16/2013  . OSA (obstructive sleep apnea) 06/16/2013  . Infection of prosthetic left knee joint ([place]) 06/16/2013  . S/P left knee resection, abx spacer 06/15/2013  . Postoperative anemia due to acute blood loss 04/30/2012  . Obese 04/30/2012  . Hyponatremia 04/30/2012    CBC    Component Value Date/Time   WBC 14.2* 09/17/2015 0315   RBC 3.84* 09/17/2015 0315   HGB 11.2* 09/17/2015 0315   HCT 34.8* 09/17/2015 0315   PLT 311 09/17/2015 0315   MCV 90.6 09/17/2015 0315   LYMPHSABS 1.7 09/15/2015 0430   MONOABS 1.4* 09/15/2015 0430   EOSABS 0.2 09/15/2015 0430   BASOSABS 0.0 09/15/2015 0430    CMP     Component Value Date/Time   NA 136 09/17/2015 0315   K 3.4* 09/17/2015 0315   CL 101 09/17/2015 0315   CO2 27 09/17/2015 0315   GLUCOSE 130* 09/17/2015 0315   BUN 11 09/17/2015 0315   CREATININE 0.56* 09/17/2015 0315   CALCIUM 7.8* 09/17/2015 0315   PROT 5.4* 09/11/2015 1521   ALBUMIN 2.2* 09/11/2015 1521   AST 78* 09/11/2015 1521   ALT 35 09/11/2015 1521   ALKPHOS 70 09/11/2015 1521   BILITOT 1.8* 09/11/2015 1521   GFRNONAA >60 09/17/2015 0315   GFRAA >60 09/17/2015 0315    Lab Results  Component Value Date   HGBA1C 8.1* 09/12/2015     Dg Chest Port 1 View  09/11/2015  CLINICAL DATA:  Acute fever and shortness of breath. EXAM: PORTABLE CHEST 1 VIEW COMPARISON:  10/20/2010 chest radiograph FINDINGS: The  cardiomediastinal silhouette is unremarkable. There is no evidence of focal airspace disease, pulmonary edema, suspicious pulmonary nodule/mass, pleural effusion, or pneumothorax. No acute bony abnormalities are identified. IMPRESSION: No active disease. Electronically Signed   By: Margarette Canada M.D.   On: 09/11/2015 14:02    Not all labs, radiology exams or other studies done during hospitalization come through on my EPIC note; however they are reviewed by me.    Assessment and Plan  Severe sepsis ([place]) secondary to left knee infection/group B streptococcal bacteremia -Sepsis protocol has been initiated, significantly improved, lactic acid normalized, cytosis is improving. -has aspiration of knee Orthopedic office that grew: aspiration in the office that he had a beta-hemolytic strep  -Blood cultures : Group B streptococcus agalactiae - Status post left knee IRRIGATION AND DEBRIDEMENT KNEE WITH POLY EXCHANGE LEFT TOTAL KNEE by Dr. Alvan Dame 2/27 - ID consult greatly appreciated, initially on IV vancomycin, transitioned to IV Rocephin on 2/28, and to continue for 6 week 10/22/2015   Streptococcal bacteremia secondary to left knee infection/group B streptococcal bacteremia -Sepsis protocol has been initiated, significantly improved, lactic acid normalized, cytosis is improving. -has aspiration of knee Orthopedic office that grew: aspiration in the office that he had a beta-hemolytic strep  -Blood cultures : Group B streptococcus agalactiae - Status post left knee IRRIGATION AND DEBRIDEMENT KNEE WITH POLY EXCHANGE LEFT TOTAL KNEE by Dr. Alvan Dame 2/27 - ID consult greatly appreciated, initially on IV vancomycin, transitioned to IV Rocephin on 2/28, and to continue for 6 week 10/22/2015 SNF - cont rocephin until 10/22/2015   Infection of prosthetic left knee joint ([place]) secondary to left knee infection/group B streptococcal bacteremia -has aspiration of knee Orthopedic office that grew: aspiration in  the office that he had a beta-hemolytic strep  -Blood cultures : Group B streptococcus agalactiae - Status post left knee IRRIGATION AND DEBRIDEMENT KNEE WITH POLY EXCHANGE LEFT TOTAL KNEE by Dr. Alvan Dame 2/27 - ID consult greatly appreciated, initially on IV vancomycin, transitioned to IV Rocephin on 2/28, and to continue for 6 week 10/22/2015 SNF - cont rocephin until 10/22/2015   High anion gap metabolic acidosis resolved with IVF SNF - will f/u with BMP  Shoulder pain, left xray was significant for osteoarthritis, bursitis, and chronic rotator cough tear , continue with OT  Postoperative anemia due to acute blood loss SNF - d/c Hb 11.2; will f/u BMP  DM (diabetes mellitus) ([place]) SNF - HgbA1C is 8.1 , resume metformin, Amaryl, will start on insulin sliding scale.  HTN (hypertension) SNF - controlled on altace 10, lasix 40 mg daily and norvasc 10 mg daily  Acute renal failure (ARF) ([facility]) SNF  -resolved wth IVF ; will f/u with BMP  Hyperlipidemia SNF - not stated as uncontrolled; cont pravachol 40 mg daily   Time spent > 45 min;> 50% of time with patient was spent reviewing records, labs, tests and studies, counseling and developing plan of care  Hennie Duos, MD

## 2015-09-20 ENCOUNTER — Encounter: Payer: Self-pay | Admitting: Internal Medicine

## 2015-09-20 DIAGNOSIS — E785 Hyperlipidemia, unspecified: Secondary | ICD-10-CM | POA: Insufficient documentation

## 2015-09-20 DIAGNOSIS — M25512 Pain in left shoulder: Secondary | ICD-10-CM | POA: Insufficient documentation

## 2015-09-20 LAB — BASIC METABOLIC PANEL: GLUCOSE: 338 mg/dL

## 2015-09-20 NOTE — Assessment & Plan Note (Signed)
SNF - HgbA1C is 8.1 , resume metformin, Amaryl, will start on insulin sliding scale.

## 2015-09-20 NOTE — Assessment & Plan Note (Addendum)
resolved with IVF SNF - will f/u with BMP

## 2015-09-20 NOTE — Assessment & Plan Note (Signed)
xray was significant for osteoarthritis, bursitis, and chronic rotator cough tear , continue with OT

## 2015-09-20 NOTE — Assessment & Plan Note (Addendum)
secondary to left knee infection/group B streptococcal bacteremia -Sepsis protocol has been initiated, significantly improved, lactic acid normalized, cytosis is improving. -has aspiration of knee Orthopedic office that grew: aspiration in the office that he had a beta-hemolytic strep  -Blood cultures : Group B streptococcus agalactiae - Status post left knee IRRIGATION AND DEBRIDEMENT KNEE WITH POLY EXCHANGE LEFT TOTAL KNEE by Dr. Alvan Dame 2/27 - ID consult greatly appreciated, initially on IV vancomycin, transitioned to IV Rocephin on 2/28, and to continue for 6 week 10/22/2015 SNF - cont rocephin until 10/22/2015

## 2015-09-20 NOTE — Assessment & Plan Note (Signed)
SNF - d/c Hb 11.2; will f/u BMP

## 2015-09-20 NOTE — Assessment & Plan Note (Signed)
SNF - controlled on altace 10, lasix 40 mg daily and norvasc 10 mg daily

## 2015-09-20 NOTE — Assessment & Plan Note (Signed)
SNF - not stated as uncontrolled; cont pravachol 40 mg daily

## 2015-09-20 NOTE — Assessment & Plan Note (Signed)
SNF  -resolved wth IVF ; will f/u with BMP

## 2015-09-20 NOTE — Assessment & Plan Note (Signed)
secondary to left knee infection/group B streptococcal bacteremia -Sepsis protocol has been initiated, significantly improved, lactic acid normalized, cytosis is improving. -has aspiration of knee Orthopedic office that grew: aspiration in the office that he had a beta-hemolytic strep  -Blood cultures : Group B streptococcus agalactiae - Status post left knee IRRIGATION AND DEBRIDEMENT KNEE WITH POLY EXCHANGE LEFT TOTAL KNEE by Dr. Alvan Dame 2/27 - ID consult greatly appreciated, initially on IV vancomycin, transitioned to IV Rocephin on 2/28, and to continue for 6 week 10/22/2015

## 2015-09-20 NOTE — Assessment & Plan Note (Signed)
secondary to left knee infection/group B streptococcal bacteremia -has aspiration of knee Orthopedic office that grew: aspiration in the office that he had a beta-hemolytic strep  -Blood cultures : Group B streptococcus agalactiae - Status post left knee IRRIGATION AND DEBRIDEMENT KNEE WITH POLY EXCHANGE LEFT TOTAL KNEE by Dr. Alvan Dame 2/27 - ID consult greatly appreciated, initially on IV vancomycin, transitioned to IV Rocephin on 2/28, and to continue for 6 week 10/22/2015 SNF - cont rocephin until 10/22/2015

## 2015-09-21 ENCOUNTER — Emergency Department (HOSPITAL_COMMUNITY): Payer: 59

## 2015-09-21 ENCOUNTER — Non-Acute Institutional Stay (SKILLED_NURSING_FACILITY): Payer: 59 | Admitting: Adult Health

## 2015-09-21 ENCOUNTER — Inpatient Hospital Stay (HOSPITAL_COMMUNITY)
Admission: EM | Admit: 2015-09-21 | Discharge: 2015-09-26 | DRG: 871 | Disposition: A | Discharge status: Skilled Nursing Facility | Payer: 59 | Attending: Internal Medicine | Admitting: Internal Medicine

## 2015-09-21 ENCOUNTER — Encounter (HOSPITAL_COMMUNITY): Payer: Self-pay | Admitting: Emergency Medicine

## 2015-09-21 ENCOUNTER — Encounter: Payer: Self-pay | Admitting: Adult Health

## 2015-09-21 ENCOUNTER — Other Ambulatory Visit: Payer: Self-pay

## 2015-09-21 ENCOUNTER — Inpatient Hospital Stay (HOSPITAL_COMMUNITY): Payer: 59

## 2015-09-21 DIAGNOSIS — Z801 Family history of malignant neoplasm of trachea, bronchus and lung: Secondary | ICD-10-CM | POA: Diagnosis not present

## 2015-09-21 DIAGNOSIS — Y831 Surgical operation with implant of artificial internal device as the cause of abnormal reaction of the patient, or of later complication, without mention of misadventure at the time of the procedure: Secondary | ICD-10-CM | POA: Diagnosis present

## 2015-09-21 DIAGNOSIS — N179 Acute kidney failure, unspecified: Secondary | ICD-10-CM | POA: Diagnosis present

## 2015-09-21 DIAGNOSIS — Z96652 Presence of left artificial knee joint: Secondary | ICD-10-CM | POA: Diagnosis present

## 2015-09-21 DIAGNOSIS — R531 Weakness: Secondary | ICD-10-CM | POA: Diagnosis present

## 2015-09-21 DIAGNOSIS — K922 Gastrointestinal hemorrhage, unspecified: Secondary | ICD-10-CM | POA: Diagnosis present

## 2015-09-21 DIAGNOSIS — M009 Pyogenic arthritis, unspecified: Secondary | ICD-10-CM | POA: Diagnosis present

## 2015-09-21 DIAGNOSIS — G4730 Sleep apnea, unspecified: Secondary | ICD-10-CM | POA: Diagnosis present

## 2015-09-21 DIAGNOSIS — B951 Streptococcus, group B, as the cause of diseases classified elsewhere: Secondary | ICD-10-CM | POA: Diagnosis present

## 2015-09-21 DIAGNOSIS — K2210 Ulcer of esophagus without bleeding: Secondary | ICD-10-CM | POA: Insufficient documentation

## 2015-09-21 DIAGNOSIS — E1165 Type 2 diabetes mellitus with hyperglycemia: Secondary | ICD-10-CM | POA: Diagnosis present

## 2015-09-21 DIAGNOSIS — Z7901 Long term (current) use of anticoagulants: Secondary | ICD-10-CM | POA: Diagnosis not present

## 2015-09-21 DIAGNOSIS — R0602 Shortness of breath: Secondary | ICD-10-CM

## 2015-09-21 DIAGNOSIS — E0810 Diabetes mellitus due to underlying condition with ketoacidosis without coma: Secondary | ICD-10-CM | POA: Diagnosis not present

## 2015-09-21 DIAGNOSIS — R6521 Severe sepsis with septic shock: Secondary | ICD-10-CM | POA: Diagnosis present

## 2015-09-21 DIAGNOSIS — R571 Hypovolemic shock: Secondary | ICD-10-CM | POA: Diagnosis present

## 2015-09-21 DIAGNOSIS — D62 Acute posthemorrhagic anemia: Secondary | ICD-10-CM

## 2015-09-21 DIAGNOSIS — E872 Acidosis: Secondary | ICD-10-CM | POA: Diagnosis present

## 2015-09-21 DIAGNOSIS — K219 Gastro-esophageal reflux disease without esophagitis: Secondary | ICD-10-CM | POA: Diagnosis present

## 2015-09-21 DIAGNOSIS — Z452 Encounter for adjustment and management of vascular access device: Secondary | ICD-10-CM

## 2015-09-21 DIAGNOSIS — Z79899 Other long term (current) drug therapy: Secondary | ICD-10-CM | POA: Diagnosis not present

## 2015-09-21 DIAGNOSIS — G9340 Encephalopathy, unspecified: Secondary | ICD-10-CM | POA: Diagnosis not present

## 2015-09-21 DIAGNOSIS — I10 Essential (primary) hypertension: Secondary | ICD-10-CM | POA: Diagnosis present

## 2015-09-21 DIAGNOSIS — K921 Melena: Secondary | ICD-10-CM | POA: Insufficient documentation

## 2015-09-21 DIAGNOSIS — T8454XA Infection and inflammatory reaction due to internal left knee prosthesis, initial encounter: Secondary | ICD-10-CM

## 2015-09-21 DIAGNOSIS — B3781 Candidal esophagitis: Secondary | ICD-10-CM | POA: Diagnosis present

## 2015-09-21 DIAGNOSIS — Z794 Long term (current) use of insulin: Secondary | ICD-10-CM

## 2015-09-21 DIAGNOSIS — G4733 Obstructive sleep apnea (adult) (pediatric): Secondary | ICD-10-CM | POA: Diagnosis present

## 2015-09-21 DIAGNOSIS — R579 Shock, unspecified: Secondary | ICD-10-CM

## 2015-09-21 DIAGNOSIS — Z7982 Long term (current) use of aspirin: Secondary | ICD-10-CM

## 2015-09-21 DIAGNOSIS — Z6838 Body mass index (BMI) 38.0-38.9, adult: Secondary | ICD-10-CM | POA: Diagnosis not present

## 2015-09-21 DIAGNOSIS — E119 Type 2 diabetes mellitus without complications: Secondary | ICD-10-CM

## 2015-09-21 DIAGNOSIS — R578 Other shock: Secondary | ICD-10-CM | POA: Diagnosis present

## 2015-09-21 DIAGNOSIS — E876 Hypokalemia: Secondary | ICD-10-CM | POA: Diagnosis present

## 2015-09-21 DIAGNOSIS — A419 Sepsis, unspecified organism: Principal | ICD-10-CM | POA: Diagnosis present

## 2015-09-21 DIAGNOSIS — M1990 Unspecified osteoarthritis, unspecified site: Secondary | ICD-10-CM | POA: Diagnosis present

## 2015-09-21 DIAGNOSIS — I5033 Acute on chronic diastolic (congestive) heart failure: Secondary | ICD-10-CM | POA: Diagnosis present

## 2015-09-21 DIAGNOSIS — D649 Anemia, unspecified: Secondary | ICD-10-CM

## 2015-09-21 LAB — BASIC METABOLIC PANEL
Anion gap: 9 (ref 5–15)
BUN: 68 mg/dL — AB (ref 6–20)
CALCIUM: 7.1 mg/dL — AB (ref 8.9–10.3)
CHLORIDE: 106 mmol/L (ref 101–111)
CO2: 20 mmol/L — ABNORMAL LOW (ref 22–32)
CREATININE: 1.06 mg/dL (ref 0.61–1.24)
GFR calc non Af Amer: >60 mL/min (ref >60)
Glucose, Bld: 377 mg/dL — ABNORMAL HIGH (ref 65–99)
Potassium: 4.4 mmol/L (ref 3.5–5.1)
SODIUM: 135 mmol/L (ref 135–145)

## 2015-09-21 LAB — TROPONIN I: Troponin I: <0.03 ng/mL (ref <0.031)

## 2015-09-21 LAB — CBC
HEMATOCRIT: 17.9 % — AB (ref 39.0–52.0)
Hemoglobin: 6 g/dL — CL (ref 13.0–17.0)
MCH: 30.5 pg (ref 26.0–34.0)
MCHC: 33.5 g/dL (ref 30.0–36.0)
MCV: 90.9 fL (ref 78.0–100.0)
PLATELETS: 579 10*3/uL — AB (ref 150–400)
RBC: 1.97 MIL/uL — AB (ref 4.22–5.81)
RDW: 13.9 % (ref 11.5–15.5)
WBC: 25.8 10*3/uL — ABNORMAL HIGH (ref 4.0–10.5)

## 2015-09-21 LAB — CBC WITH DIFFERENTIAL/PLATELET
Band Neutrophils: 9 %
Basophils Absolute: 0 10*3/uL (ref 0.0–0.1)
Basophils Relative: 0 %
Blasts: 0 %
Eosinophils Absolute: 0 10*3/uL (ref 0.0–0.7)
Eosinophils Relative: 0 %
HCT: 17.7 % — ABNORMAL LOW (ref 39.0–52.0)
Hemoglobin: 5.6 g/dL — CL (ref 13.0–17.0)
Lymphocytes Relative: 5 %
Lymphs Abs: 1.9 10*3/uL (ref 0.7–4.0)
MCH: 29.2 pg (ref 26.0–34.0)
MCHC: 31.6 g/dL (ref 30.0–36.0)
MCV: 92.2 fL (ref 78.0–100.0)
Metamyelocytes Relative: 3 %
Monocytes Absolute: 1.5 10*3/uL — ABNORMAL HIGH (ref 0.1–1.0)
Monocytes Relative: 4 %
Myelocytes: 0 %
Neutro Abs: 34.8 10*3/uL — ABNORMAL HIGH (ref 1.7–7.7)
Neutrophils Relative %: 79 %
Other: 0 %
Platelets: 917 10*3/uL (ref 150–400)
Promyelocytes Absolute: 0 %
RBC: 1.92 MIL/uL — ABNORMAL LOW (ref 4.22–5.81)
RDW: 14.4 % (ref 11.5–15.5)
WBC: 38.2 10*3/uL — ABNORMAL HIGH (ref 4.0–10.5)
nRBC: 0 /100 WBC

## 2015-09-21 LAB — GLUCOSE, CAPILLARY
GLUCOSE-CAPILLARY: 313 mg/dL — AB (ref 65–99)
Glucose-Capillary: 341 mg/dL — ABNORMAL HIGH (ref 65–99)

## 2015-09-21 LAB — URINE MICROSCOPIC-ADD ON
Bacteria, UA: NONE SEEN
RBC / HPF: NONE SEEN RBC/hpf (ref 0–5)

## 2015-09-21 LAB — COMPREHENSIVE METABOLIC PANEL
ALK PHOS: 88 U/L (ref 38–126)
ALT: 18 U/L (ref 17–63)
ALT: 22 U/L (ref 17–63)
AST: 28 U/L (ref 15–41)
AST: 37 U/L (ref 15–41)
Albumin: 1.2 g/dL — ABNORMAL LOW (ref 3.5–5.0)
Albumin: 1.1 g/dL — ABNORMAL LOW (ref 3.5–5.0)
Alkaline Phosphatase: 41 U/L (ref 38–126)
Anion gap: 20 — ABNORMAL HIGH (ref 5–15)
Anion gap: 17 — ABNORMAL HIGH (ref 5–15)
BILIRUBIN TOTAL: 0.5 mg/dL (ref 0.3–1.2)
BUN: 61 mg/dL — ABNORMAL HIGH (ref 6–20)
BUN: 66 mg/dL — AB (ref 6–20)
CALCIUM: 7 mg/dL — AB (ref 8.9–10.3)
CHLORIDE: 104 mmol/L (ref 101–111)
CO2: 14 mmol/L — ABNORMAL LOW (ref 22–32)
CO2: 14 mmol/L — ABNORMAL LOW (ref 22–32)
CREATININE: 1.32 mg/dL — AB (ref 0.61–1.24)
Calcium: 7.7 mg/dL — ABNORMAL LOW (ref 8.9–10.3)
Chloride: 100 mmol/L — ABNORMAL LOW (ref 101–111)
Creatinine, Ser: 1.4 mg/dL — ABNORMAL HIGH (ref 0.61–1.24)
GFR calc Af Amer: 60 mL/min — ABNORMAL LOW (ref >60)
GFR calc non Af Amer: 52 mL/min — ABNORMAL LOW (ref >60)
GFR, EST NON AFRICAN AMERICAN: 55 mL/min — AB (ref >60)
Glucose, Bld: 359 mg/dL — ABNORMAL HIGH (ref 65–99)
Glucose, Bld: 327 mg/dL — ABNORMAL HIGH (ref 65–99)
Potassium: 5.7 mmol/L — ABNORMAL HIGH (ref 3.5–5.1)
Potassium: 5.3 mmol/L — ABNORMAL HIGH (ref 3.5–5.1)
Sodium: 134 mmol/L — ABNORMAL LOW (ref 135–145)
Sodium: 135 mmol/L (ref 135–145)
TOTAL PROTEIN: 3.9 g/dL — AB (ref 6.5–8.1)
Total Bilirubin: 0.6 mg/dL (ref 0.3–1.2)
Total Protein: 4.5 g/dL — ABNORMAL LOW (ref 6.5–8.1)

## 2015-09-21 LAB — URINALYSIS, ROUTINE W REFLEX MICROSCOPIC
Bilirubin Urine: NEGATIVE
Glucose, UA: >1000 mg/dL — AB
Hgb urine dipstick: NEGATIVE
Ketones, ur: NEGATIVE mg/dL
Leukocytes, UA: NEGATIVE
Nitrite: NEGATIVE
Protein, ur: NEGATIVE mg/dL
Specific Gravity, Urine: 1.022 (ref 1.005–1.030)
pH: 5 (ref 5.0–8.0)

## 2015-09-21 LAB — PROTIME-INR
INR: 1.83 — ABNORMAL HIGH (ref 0.00–1.49)
Prothrombin Time: 20.4 seconds — ABNORMAL HIGH (ref 11.6–15.2)

## 2015-09-21 LAB — PREPARE RBC (CROSSMATCH)

## 2015-09-21 LAB — APTT: APTT: 28 s (ref 24–37)

## 2015-09-21 LAB — LACTIC ACID, PLASMA
LACTIC ACID, VENOUS: 8.9 mmol/L — AB (ref 0.5–2.0)
LACTIC ACID, VENOUS: 2.7 mmol/L — AB (ref 0.5–2.0)
Lactic Acid, Venous: 1.7 mmol/L (ref 0.5–2.0)

## 2015-09-21 LAB — HEMOGLOBIN AND HEMATOCRIT, BLOOD
HEMATOCRIT: 20.4 % — AB (ref 39.0–52.0)
HEMOGLOBIN: 6.8 g/dL — AB (ref 13.0–17.0)

## 2015-09-21 LAB — I-STAT CG4 LACTIC ACID, ED: Lactic Acid, Venous: 12.27 mmol/L (ref 0.5–2.0)

## 2015-09-21 LAB — MAGNESIUM: MAGNESIUM: 1.8 mg/dL (ref 1.7–2.4)

## 2015-09-21 LAB — PROCALCITONIN: Procalcitonin: 0.67 ng/mL

## 2015-09-21 LAB — PHOSPHORUS: PHOSPHORUS: 6.4 mg/dL — AB (ref 2.5–4.6)

## 2015-09-21 LAB — POC OCCULT BLOOD, ED: Fecal Occult Bld: POSITIVE — AB

## 2015-09-21 LAB — CORTISOL: Cortisol, Plasma: 25.4 ug/dL

## 2015-09-21 MED ORDER — SODIUM CHLORIDE 0.9% FLUSH
10.0000 mL | INTRAVENOUS | Status: DC | PRN
  Administered 2015-09-25: 20 mL
  Filled 2015-09-21: qty 40

## 2015-09-21 MED ORDER — NOREPINEPHRINE BITARTRATE 1 MG/ML IV SOLN
0.0000 ug/min | INTRAVENOUS | Status: DC
  Filled 2015-09-21: qty 4

## 2015-09-21 MED ORDER — SODIUM CHLORIDE 0.9 % IV SOLN
10.0000 mL/h | Freq: Once | INTRAVENOUS | Status: AC
  Administered 2015-09-21: 10 mL/h via INTRAVENOUS

## 2015-09-21 MED ORDER — VANCOMYCIN HCL 10 G IV SOLR
1250.0000 mg | Freq: Two times a day (BID) | INTRAVENOUS | Status: DC
  Administered 2015-09-22 – 2015-09-23 (×3): 1250 mg via INTRAVENOUS
  Filled 2015-09-21 (×4): qty 1250

## 2015-09-21 MED ORDER — SODIUM CHLORIDE 0.9 % IV SOLN
Freq: Once | INTRAVENOUS | Status: AC
  Administered 2015-09-22: 02:00:00 via INTRAVENOUS

## 2015-09-21 MED ORDER — SODIUM CHLORIDE 0.9 % IV BOLUS (SEPSIS)
1000.0000 mL | Freq: Once | INTRAVENOUS | Status: AC
  Administered 2015-09-21: 1000 mL via INTRAVENOUS

## 2015-09-21 MED ORDER — HYDROCORTISONE NA SUCCINATE PF 100 MG IJ SOLR
50.0000 mg | Freq: Four times a day (QID) | INTRAMUSCULAR | Status: DC
  Administered 2015-09-21 – 2015-09-23 (×7): 50 mg via INTRAVENOUS
  Filled 2015-09-21 (×7): qty 2

## 2015-09-21 MED ORDER — SODIUM CHLORIDE 0.9 % IV BOLUS (SEPSIS)
3000.0000 mL | Freq: Once | INTRAVENOUS | Status: AC
  Administered 2015-09-21: 3000 mL via INTRAVENOUS

## 2015-09-21 MED ORDER — PANTOPRAZOLE SODIUM 40 MG IV SOLR
40.0000 mg | Freq: Two times a day (BID) | INTRAVENOUS | Status: DC
  Administered 2015-09-25: 40 mg via INTRAVENOUS
  Filled 2015-09-21 (×2): qty 40

## 2015-09-21 MED ORDER — VANCOMYCIN HCL 10 G IV SOLR
2000.0000 mg | Freq: Once | INTRAVENOUS | Status: AC
  Administered 2015-09-21: 2000 mg via INTRAVENOUS
  Filled 2015-09-21: qty 2000

## 2015-09-21 MED ORDER — SODIUM CHLORIDE 0.9 % IV SOLN
80.0000 mg | Freq: Once | INTRAVENOUS | Status: AC
  Administered 2015-09-21: 80 mg via INTRAVENOUS
  Filled 2015-09-21: qty 80

## 2015-09-21 MED ORDER — SODIUM CHLORIDE 0.9 % IV BOLUS (SEPSIS): 1000.0000 mL | INTRAVENOUS | Status: DC

## 2015-09-21 MED ORDER — INSULIN ASPART 100 UNIT/ML ~~LOC~~ SOLN
0.0000 [IU] | SUBCUTANEOUS | Status: DC
  Administered 2015-09-21: 11 [IU] via SUBCUTANEOUS
  Administered 2015-09-22: 5 [IU] via SUBCUTANEOUS
  Administered 2015-09-22: 8 [IU] via SUBCUTANEOUS
  Administered 2015-09-22: 5 [IU] via SUBCUTANEOUS
  Administered 2015-09-22: 8 [IU] via SUBCUTANEOUS
  Administered 2015-09-22: 5 [IU] via SUBCUTANEOUS
  Administered 2015-09-22: 11 [IU] via SUBCUTANEOUS
  Administered 2015-09-23 (×5): 3 [IU] via SUBCUTANEOUS
  Administered 2015-09-23: 8 [IU] via SUBCUTANEOUS
  Administered 2015-09-24: 5 [IU] via SUBCUTANEOUS
  Administered 2015-09-24: 8 [IU] via SUBCUTANEOUS
  Administered 2015-09-24 (×3): 5 [IU] via SUBCUTANEOUS
  Administered 2015-09-25 (×3): 3 [IU] via SUBCUTANEOUS

## 2015-09-21 MED ORDER — SODIUM CHLORIDE 0.9% FLUSH
10.0000 mL | Freq: Two times a day (BID) | INTRAVENOUS | Status: DC
  Administered 2015-09-22 – 2015-09-24 (×7): 10 mL

## 2015-09-21 MED ORDER — VASOPRESSIN 20 UNIT/ML IV SOLN
0.0300 [IU]/min | INTRAVENOUS | Status: DC
  Filled 2015-09-21: qty 2

## 2015-09-21 MED ORDER — SODIUM CHLORIDE 0.9 % IV SOLN
INTRAVENOUS | Status: DC
  Administered 2015-09-21: 19:00:00 via INTRAVENOUS

## 2015-09-21 MED ORDER — PIPERACILLIN-TAZOBACTAM 3.375 G IVPB 30 MIN
3.3750 g | Freq: Once | INTRAVENOUS | Status: AC
  Administered 2015-09-21: 3.375 g via INTRAVENOUS
  Filled 2015-09-21: qty 50

## 2015-09-21 MED ORDER — PIPERACILLIN-TAZOBACTAM 3.375 G IVPB
3.3750 g | Freq: Three times a day (TID) | INTRAVENOUS | Status: DC
  Administered 2015-09-21 – 2015-09-23 (×5): 3.375 g via INTRAVENOUS
  Filled 2015-09-21 (×5): qty 50

## 2015-09-21 MED ORDER — VANCOMYCIN HCL IN DEXTROSE 1-5 GM/200ML-% IV SOLN: 1000.0000 mg | Freq: Once | INTRAVENOUS | Status: DC

## 2015-09-21 MED ORDER — SODIUM CHLORIDE 0.9 % IV SOLN: 8.0000 mg/h | INTRAVENOUS | Status: DC

## 2015-09-21 MED ORDER — SODIUM CHLORIDE 0.9 % IV SOLN
8.0000 mg/h | INTRAVENOUS | Status: AC
  Administered 2015-09-21 – 2015-09-24 (×6): 8 mg/h via INTRAVENOUS
  Filled 2015-09-21 (×13): qty 80

## 2015-09-21 MED ORDER — NOREPINEPHRINE BITARTRATE 1 MG/ML IV SOLN
5.0000 ug/min | INTRAVENOUS | Status: DC
  Administered 2015-09-21 – 2015-09-22 (×2): 5 ug/min via INTRAVENOUS

## 2015-09-21 MED ORDER — SODIUM CHLORIDE 0.9 % IV SOLN: 80.0000 mg | Freq: Once | INTRAVENOUS | Status: DC

## 2015-09-21 MED ORDER — SODIUM CHLORIDE 0.9 % IV SOLN: 500.0000 mL | INTRAVENOUS | Status: DC | PRN

## 2015-09-21 MED ORDER — PIPERACILLIN-TAZOBACTAM 3.375 G IVPB 30 MIN: 3.3750 g | Freq: Once | INTRAVENOUS | Status: DC

## 2015-09-21 MED ORDER — VANCOMYCIN HCL 10 G IV SOLR: 1250.0000 mg | Freq: Two times a day (BID) | INTRAVENOUS | Status: DC

## 2015-09-21 MED ORDER — INSULIN ASPART 100 UNIT/ML ~~LOC~~ SOLN: 2.0000 [IU] | SUBCUTANEOUS | Status: DC

## 2015-09-21 NOTE — Progress Notes (Signed)
Patient ID: Hunter Santos, male   DOB: 1951-04-18, 65 y.o.   MRN: [id number]   Facility:  [facility]       No Known Allergies  Chief Complaint  Patient presents with  . Discharge Note    Sent to ED    HPI:  He is lethargic; has low blood pressure and is mildly distressed. I am very concerned about shock and will send him to the hospital for further evaluation. He is unable to participate in the hpi or ros.    Past Medical History  Diagnosis Date  . Hypertension   . Diabetes mellitus   . Arthritis   . History of transfusion   . Sleep apnea     had surgery for deviated septum previously    Past Surgical History  Procedure Laterality Date  . Tonsillectomy      as child  . Left ankle  2006  . Nasal septum surgery  2011  . Total knee arthroplasty  04/29/2012    Procedure: TOTAL KNEE ARTHROPLASTY;  Surgeon: Mauri Pole, MD;  Location: [facility];  Service: Orthopedics;  Laterality: Left;  . Excisional total knee arthroplasty Left 06/15/2013    Procedure: LEFT RESECTION TOTAL KNEE ARTHROPLASTY WITH PLACEMENT OF ANTIBIOTIC SPACERS;  Surgeon: Mauri Pole, MD;  Location: [facility];  Service: Orthopedics;  Laterality: Left;  . Total knee revision Left 08/17/2013    Procedure: REIMPLANTATION LEFT TOTAL KNEE ARTHROPLASTY WITH REMOVAL OF ANTIBIOTIC SPACER ;  Surgeon: Mauri Pole, MD;  Location: [facility];  Service: Orthopedics;  Laterality: Left;  . I&d knee with poly exchange Left 09/12/2015    Procedure: IRRIGATION AND DEBRIDEMENT KNEE WITH POLY EXCHANGE LEFT TOTAL KNEE;  Surgeon: Paralee Cancel, MD;  Location: [facility];  Service: Orthopedics;  Laterality: Left;    VITAL SIGNS BP 80/53 mmHg  Pulse 101  Temp(Src) 98.5 F (36.9 C) (Oral)  Resp 17  Ht 6' (1.829 m)  Wt 275 lb (124.739 kg)  BMI 37.29 kg/m2  SpO2 95%  Patient's Medications  New Prescriptions   No medications on file  Previous Medications   ACETAMINOPHEN (TYLENOL) 325 MG TABLET    Take 2 tablets (650 mg total) by  mouth every 6 (six) hours as needed for mild pain (or Fever >/= 101).   AMLODIPINE (NORVASC) 10 MG TABLET    Take 10 mg by mouth daily before breakfast.   ASPIRIN 81 MG TABLET    Take 81 mg by mouth daily.   CEFTRIAXONE 2 G IN DEXTROSE 5 % 50 ML    Inject 2 g into the vein daily. Continue until 10/22/2015   ENOXAPARIN (LOVENOX) 40 MG/0.4ML INJECTION    Inject 0.4 mLs (40 mg total) into the skin daily. To continue for 30 days for DVT prophylaxis then stop.   FUROSEMIDE (LASIX) 40 MG TABLET    Take 40 mg by mouth daily before breakfast.   GLIMEPIRIDE (AMARYL) 4 MG TABLET    Take 4 mg by mouth daily before breakfast.   INSULIN ASPART (NOVOLOG) 100 UNIT/ML INJECTION    Inject 0-9 Units into the skin 3 (three) times daily with meals.   MENTHOL, TOPICAL ANALGESIC, (BLUE-EMU MAXIMUM STRENGTH EX)    Apply 1 application topically 2 (two) times daily as needed (muscle strain). Patient uses on shoulders   METFORMIN (GLUCOPHAGE) 500 MG TABLET    Take 500-1,000 mg by mouth 2 (two) times daily with a meal. Take 1000mg  in the morning, and then 500mg  at night  METHOCARBAMOL (ROBAXIN) 500 MG TABLET    Take 1 tablet (500 mg total) by mouth every 8 (eight) hours as needed for muscle spasms.   ONDANSETRON (ZOFRAN) 4 MG TABLET    Take 1 tablet (4 mg total) by mouth every 6 (six) hours as needed for nausea.   OXYCODONE (OXY IR/ROXICODONE) 5 MG IMMEDIATE RELEASE TABLET    Take 1 tablet (5 mg total) by mouth every 6 (six) hours as needed for severe pain.   PHOSPHORUS (K PHOS NEUTRAL) [id number] MG TABLET    Take 1 tablet (250 mg total) by mouth daily.   PRAVASTATIN (PRAVACHOL) 40 MG TABLET    Take 40 mg by mouth every evening.   RAMIPRIL (ALTACE) 10 MG TABLET    Take 10 mg by mouth daily before breakfast.  Modified Medications   No medications on file  Discontinued Medications   No medications on file     SIGNIFICANT DIAGNOSTIC EXAMS  Review of Systems  Unable to perform ROS: medical condition    Physical Exam   Constitutional: He appears distressed.  Obese   Eyes: Conjunctivae are normal.  Neck: Neck supple. No JVD present. No thyromegaly present.  Cardiovascular: Normal rate, regular rhythm and intact distal pulses.   Respiratory: Effort normal and breath sounds normal. No respiratory distress. He has no wheezes.  GI: Soft. Bowel sounds are normal. He exhibits no distension. There is no tenderness.  Musculoskeletal: He exhibits no edema.  Able to move all extremities   Lymphadenopathy:    He has no cervical adenopathy.  Neurological:  Lethargic   Skin: Skin is warm. He is diaphoretic.  Psychiatric: He has a normal mood and affect.      ASSESSMENT/ PLAN:   Will send him to the ED for further evaluation and treatment options.    Time spent with patient  40  minutes >50% time spent counseling; reviewing medical record; tests; labs; and developing future plan of care  Ok Edwards NP [facility]  Contact [phone] Monday through Friday 8am- 5pm  After hours call [phone]

## 2015-09-21 NOTE — Progress Notes (Signed)
[place] Progress Note Patient Name: Hunter Santos DOB: 16-Jan-1951 MRN: [id number]   Date of Service  09/21/2015  HPI/Events of Note  Camera check on patient after arrival to the intensive care unit. Resting comfortably in bed.Mean arterial pressure 65 & SBP 100. Patient awake.  eICU Interventions  1. Discontinue further fluid resuscitation pending CVP 2. Continue with transfusion of second unit of packed red blood cells 3. Discontinue  Point-of-carelactic acid 4. Discontinue vasopressin infusion 5. Order placed for removal of PICC line & culture of tip     Intervention Category Major Interventions: Shock - evaluation and management  Gerrard Schacher 09/21/2015, 6:02 PM

## 2015-09-21 NOTE — ED Notes (Signed)
Bed: [id number] Expected date:  Expected time:  Means of arrival:  Comments: 57F/hyperglycemia/lethargic

## 2015-09-21 NOTE — ED Notes (Signed)
I ATTEMPTED TWICE TO COLLECT SECOND SET OF CULTURES AND WAS UNSUCCESSFUL.

## 2015-09-21 NOTE — Procedures (Signed)
Central Venous Catheter Insertion Procedure Note DAILAN RENTFRO [id number] 09/18/1950  Procedure: Insertion of Central Venous Catheter Indications: Assessment of intravascular volume and Drug and/or fluid administration  Procedure Details Consent: Risks of procedure as well as the alternatives and risks of each were explained to the (patient/caregiver).  Consent for procedure obtained. Time Out: Verified patient identification, verified procedure, site/side was marked, verified correct patient position, special equipment/implants available, medications/allergies/relevent history reviewed, required imaging and test results available.  Performed  Maximum sterile technique was used including antiseptics, cap, gloves, gown, hand hygiene, mask and sheet. Skin prep: Chlorhexidine; local anesthetic administered A antimicrobial bonded/coated triple lumen catheter was placed in the left internal jugular vein using the Seldinger technique.  U/S used in placement.  Evaluation Blood flow good Complications: No apparent complications Patient did tolerate procedure well. Chest X-ray ordered to verify placement.  CXR: pending.  Janayla Marik 09/21/2015, 3:44 PM

## 2015-09-21 NOTE — ED Notes (Signed)
Tech attempted x2 to draw blood cultures, no success, RN will consult

## 2015-09-21 NOTE — ED Notes (Signed)
Critical Lactic Acid 8.9.

## 2015-09-21 NOTE — Progress Notes (Signed)
Pharmacy Antibiotic Note  Please see note from earlier this afternoon for further detail.  Pharmacy following for vancomycin consult and have now been consulted to dose Zosyn in this patient presenting with sepsis.  First doses of Vancomycin and Zosyn have already been given in ED.  CrCl~72 ml/min.  Plan: Start Zosyn 3.375g IV q8h (4 hour infusion time).   Hershal Coria, PharmD, BCPS Pager: [phone] 09/21/2015 4:07 PM

## 2015-09-21 NOTE — ED Provider Notes (Signed)
CSN: [id number]     Arrival date & time 09/21/15  1245 History   First MD Initiated Contact with Patient 09/21/15 1311     Chief Complaint  Patient presents with  . Code Sepsis     (Consider location/radiation/quality/duration/timing/severity/associated sxs/prior Treatment) HPI   64yM presenting from NH for evaluation of increasing weakness, hypotension, melena. Pt just discharged from hospital after admission 2/26-3/4 with severe sepsis/group B strep bacteremia with septic L knee. S/p L knee I&D on 2/27. Discharged on IV rocephin via [place]. Also on prophylactic lovenox daily. Pt Peluso is critically ill. He answers questions appropriately but is vague on details. His biggest complaint is dyspnea. Progressively worsening over the last few days. He does endorse melena when asked but not sure when first noticed. Denies any pain aside from L knee which he says has been improving.     Past Medical History  Diagnosis Date  . Hypertension   . Diabetes mellitus   . Arthritis   . History of transfusion   . Sleep apnea     had surgery for deviated septum previously   Past Surgical History  Procedure Laterality Date  . Tonsillectomy      as child  . Left ankle  2006  . Nasal septum surgery  2011  . Total knee arthroplasty  04/29/2012    Procedure: TOTAL KNEE ARTHROPLASTY;  Surgeon: Mauri Pole, MD;  Location: [facility];  Service: Orthopedics;  Laterality: Left;  . Excisional total knee arthroplasty Left 06/15/2013    Procedure: LEFT RESECTION TOTAL KNEE ARTHROPLASTY WITH PLACEMENT OF ANTIBIOTIC SPACERS;  Surgeon: Mauri Pole, MD;  Location: [facility];  Service: Orthopedics;  Laterality: Left;  . Total knee revision Left 08/17/2013    Procedure: REIMPLANTATION LEFT TOTAL KNEE ARTHROPLASTY WITH REMOVAL OF ANTIBIOTIC SPACER ;  Surgeon: Mauri Pole, MD;  Location: [facility];  Service: Orthopedics;  Laterality: Left;  . I&d knee with poly exchange Left 09/12/2015    Procedure: IRRIGATION AND  DEBRIDEMENT KNEE WITH POLY EXCHANGE LEFT TOTAL KNEE;  Surgeon: Paralee Cancel, MD;  Location: [facility];  Service: Orthopedics;  Laterality: Left;   Family History  Problem Relation Age of Onset  . Cancer Father     Lung   Social History  Substance Use Topics  . Smoking status: Never Smoker   . Smokeless tobacco: Never Used  . Alcohol Use: No    Review of Systems  All systems reviewed and negative, other than as noted in HPI.   Allergies  Review of patient's allergies indicates no known allergies.  Home Medications   Prior to Admission medications   Medication Sig Start Date End Date Taking? Authorizing Provider  acetaminophen (TYLENOL) 325 MG tablet Take 2 tablets (650 mg total) by mouth every 6 (six) hours as needed for mild pain (or Fever >/= 101). 09/17/15   Silver Huguenin Elgergawy, MD  amLODipine (NORVASC) 10 MG tablet Take 10 mg by mouth daily before breakfast.    Historical Provider, MD  aspirin 81 MG tablet Take 81 mg by mouth daily.    Historical Provider, MD  cefTRIAXone 2 g in dextrose 5 % 50 mL Inject 2 g into the vein daily. Continue until 10/22/2015 09/17/15   Silver Huguenin Elgergawy, MD  enoxaparin (LOVENOX) 40 MG/0.4ML injection Inject 0.4 mLs (40 mg total) into the skin daily. To continue for 30 days for DVT prophylaxis then stop. 09/17/15   Silver Huguenin Elgergawy, MD  furosemide (LASIX) 40 MG tablet Take  40 mg by mouth daily before breakfast.    Historical Provider, MD  glimepiride (AMARYL) 4 MG tablet Take 4 mg by mouth daily before breakfast.    Historical Provider, MD  insulin aspart (NOVOLOG) 100 UNIT/ML injection Inject 0-9 Units into the skin 3 (three) times daily with meals. 09/17/15   Silver Huguenin Elgergawy, MD  Menthol, Topical Analgesic, (BLUE-EMU MAXIMUM STRENGTH EX) Apply 1 application topically 2 (two) times daily as needed (muscle strain). Patient uses on shoulders    Historical Provider, MD  metFORMIN (GLUCOPHAGE) 500 MG tablet Take 500-1,000 mg by mouth 2 (two) times daily with a  meal. Take 1000mg  in the morning, and then 500mg  at night    Historical Provider, MD  methocarbamol (ROBAXIN) 500 MG tablet Take 1 tablet (500 mg total) by mouth every 8 (eight) hours as needed for muscle spasms. 09/17/15   Silver Huguenin Elgergawy, MD  ondansetron (ZOFRAN) 4 MG tablet Take 1 tablet (4 mg total) by mouth every 6 (six) hours as needed for nausea. 09/17/15   Silver Huguenin Elgergawy, MD  oxyCODONE (OXY IR/ROXICODONE) 5 MG immediate release tablet Take 1 tablet (5 mg total) by mouth every 6 (six) hours as needed for severe pain. 09/17/15   Albertine Patricia, MD  phosphorus (K PHOS NEUTRAL) [id number] MG tablet Take 1 tablet (250 mg total) by mouth daily. 09/17/15   Silver Huguenin Elgergawy, MD  pravastatin (PRAVACHOL) 40 MG tablet Take 40 mg by mouth every evening.    Historical Provider, MD  ramipril (ALTACE) 10 MG tablet Take 10 mg by mouth daily before breakfast.    Historical Provider, MD   There were no vitals taken for this visit. Physical Exam  Constitutional: He appears well-developed. He appears distressed.  Laying in bed. Tired appearing and very pale.   HENT:  Head: Normocephalic.  Subacute ecchymosis to L periorbital region.  Eyes: Right eye exhibits no discharge. Left eye exhibits no discharge.  Pale conjunctiva  Neck: Neck supple.  Cardiovascular: Regular rhythm and normal heart sounds.  Exam reveals no gallop and no friction rub.   No murmur heard. tachycardic  Pulmonary/Chest: No respiratory distress.  Tachypnea. Decreased b/l. No adventitious breath sounds appreciated.   Abdominal: Soft. He exhibits no distension. There is no tenderness.  Scattered ecchymosis abdominal wall consistent with recent subq injections. Soft. Non tender. No distension.   Genitourinary:  Melanotic stool sample sent with patient. Melena noted when checking rectal temp.   Musculoskeletal: He exhibits no edema or tenderness.  L knee with clean/dry bandage in place. Swelling, but doesn't necessarily seem out  of proportion to me for recent surgery. Doesn't feel warm. No redness.   Neurological:  Tired. Will open eyes to voice. Follows simple commands. Speech is clear and answers questions appropriately.   Skin: Skin is warm and dry.  Nursing note and vitals reviewed.   ED Course  Procedures (including critical care time)  CRITICAL CARE Performed by: Virgel Manifold Total critical care time: 45 minutes Critical care time was exclusive of separately billable procedures and treating other patients. Critical care was necessary to treat or prevent imminent or life-threatening deterioration. Critical care was time spent personally by me on the following activities: development of treatment plan with patient and/or surrogate as well as nursing, discussions with consultants, evaluation of patient's response to treatment, examination of patient, obtaining history from patient or surrogate, ordering and performing treatments and interventions, ordering and review of laboratory studies, ordering and review of radiographic studies, pulse oximetry  and re-evaluation of patient's condition.  Labs Review Labs Reviewed  COMPREHENSIVE METABOLIC PANEL - Abnormal; Notable for the following:    Sodium 134 (*)    Potassium 5.7 (*)    Chloride 100 (*)    CO2 14 (*)    Glucose, Bld 359 (*)    BUN 61 (*)    Creatinine, Ser 1.40 (*)    Calcium 7.7 (*)    Total Protein 4.5 (*)    Albumin 1.2 (*)    GFR calc non Af Amer 52 (*)    GFR calc Af Amer 60 (*)    Anion gap 20 (*)    All other components within normal limits  CBC WITH DIFFERENTIAL/PLATELET - Abnormal; Notable for the following:    WBC 38.2 (*)    RBC 1.92 (*)    Hemoglobin 5.6 (*)    HCT 17.7 (*)    Platelets 917 (*)    Neutro Abs 34.8 (*)    Monocytes Absolute 1.5 (*)    All other components within normal limits  URINALYSIS, ROUTINE W REFLEX MICROSCOPIC (NOT AT [facility]) - Abnormal; Notable for the following:    Glucose, UA >1000 (*)    All other  components within normal limits  URINE MICROSCOPIC-ADD ON - Abnormal; Notable for the following:    Squamous Epithelial / LPF 0-5 (*)    All other components within normal limits  I-STAT CG4 LACTIC ACID, ED - Abnormal; Notable for the following:    Lactic Acid, Venous 12.27 (*)    All other components within normal limits  POC OCCULT BLOOD, ED - Abnormal; Notable for the following:    Fecal Occult Bld POSITIVE (*)    All other components within normal limits  CULTURE, BLOOD (ROUTINE X 2)  CULTURE, BLOOD (ROUTINE X 2)  URINE CULTURE  CULTURE, BLOOD (ROUTINE X 2)  CULTURE, BLOOD (ROUTINE X 2)  URINE CULTURE  OCCULT BLOOD X 1 CARD TO LAB, STOOL  PATHOLOGIST SMEAR REVIEW  CBC  COMPREHENSIVE METABOLIC PANEL  LACTIC ACID, PLASMA  LACTIC ACID, PLASMA  URINALYSIS, ROUTINE W REFLEX MICROSCOPIC (NOT AT [facility])  CORTISOL  TROPONIN I  PROTIME-INR  PROCALCITONIN  APTT  TROPONIN I  TROPONIN I  MAGNESIUM  PHOSPHORUS  BASIC METABOLIC PANEL  HEMOGLOBIN AND HEMATOCRIT, BLOOD  HEMOGLOBIN AND HEMATOCRIT, BLOOD  HEMOGLOBIN AND HEMATOCRIT, BLOOD  HEMOGLOBIN AND HEMATOCRIT, BLOOD  I-STAT CG4 LACTIC ACID, ED  TYPE AND SCREEN  PREPARE RBC (CROSSMATCH)  TYPE AND SCREEN    Imaging Review Dg Chest Portable 1 View  09/21/2015  CLINICAL DATA:  Fever EXAM: PORTABLE CHEST 1 VIEW COMPARISON:  09/11/2015 FINDINGS: Cardiac shadow is stable. The lungs are well aerated bilaterally. No focal infiltrate or sizable effusion is seen. Interval placement of right PICC line is noted in satisfactory position. IMPRESSION: No acute abnormality noted. Electronically Signed   By: Inez Catalina M.D.   On: 09/21/2015 13:44   I have personally reviewed and evaluated these images and lab results as part of my medical decision-making.   EKG Interpretation   Date/Time:  Wednesday September 21 2015 12:55:58 EST Ventricular Rate:  114 PR Interval:  144 QRS Duration: 115 QT Interval:  364 QTC Calculation: 501 R Axis:    -76 Text Interpretation:  Sinus tachycardia Right bundle branch block Inferior  infarct, age indeterminate Anterolateral infarct, age indeterminate  Confirmed by Wilson Singer  MD, [place] ([id number]) on 09/21/2015 2:58:11 PM      MDM   Final diagnoses:  Hemorrhagic  shock  Severe anemia  UGIB (upper gastrointestinal bleed)    65 year old male with tachycardia and hypotension. Suspect hemorrhagic shock/severe anemia. Melanotic stool. Very pale. Sepsis is consideration but I feel less likely particularly with concurrent Rocephin. Given how sick he is though, will broaden coverage until clinical picture is more clear. His right upper extremity PICC looks fine externally. He reports that his left knee pain has been improving since his surgery.  Is swollen, but just had surgery. No concerning redness or drainage noted. Is dyspneic but could be explained by anemia. Denies cough, fever.   1:45 PM IVF infusing. No new complaints. BP yet to improve. Will start levophed.   2:07 PM Indeed very anemic. Hemoglobin 5.6 from 11.2 just 4 days ago. Melena. BUN 61 with Cr 1.4. Likely brisk UGIB. Discharge summary lists lovenox 40mg  daily for 30 days. He has presumably been getting this. Emergent release blood ordered. PPI bolus/gtt. Will discuss with GI. Difficulty with access. Has RUE PICC, but multiple meds/drips. If cannot obtain some PIVs, will place triple lumen catheter.   3:16 PM CCM at bedside. They have some concerns about his L knee. [facility] paged per their request. Discussed with GI. On call doc currently doing ERCP. Appreciate Cecille Rubin coming to evaluate the patient.   Virgel Manifold, MD 09/21/15 [phone]

## 2015-09-21 NOTE — Progress Notes (Signed)
[place] Progress Note Patient Name: LEITH SEARCH DOB: 26-Sep-1950 MRN: [id number]   Date of Service  09/21/2015  HPI/Events of Note  Bedside nurse notified of persistent hyperglycemia. CVP 12. Patient finishing transfusion of packed red blood cells.  eICU Interventions  1. Increase sliding scale insulin algorithm 2. Continue Accu-Cheks every 4 hours 3. Awaiting labs posttransfusion 4. Holding on further IV fluid bolus given CVP 12     Intervention Category Intermediate Interventions: Hyperglycemia - evaluation and treatment  Avontay Kreinbrink 09/21/2015, 7:44 PM

## 2015-09-21 NOTE — Progress Notes (Addendum)
CM noted CM consult for pt  No family present in ED room  Pt asleep with blood infusing Not awaken Pt with septic shock  ED CM spoke with ED SW about pt being from [facility] and wife not sure if she wants pt to return  Inpatient SW consult in [facility]

## 2015-09-21 NOTE — ED Notes (Signed)
From [facility] via [place], staff report dark tarry stool, pt is hypotensive, hypergylcemic and lethargic, PICC line accessed and running on arrival, A/O X5

## 2015-09-21 NOTE — Progress Notes (Signed)
[place] Progress Note Patient Name: Hunter Santos DOB: 1951-01-29 MRN: [id number]   Date of Service  09/21/2015  HPI/Events of Note  Hgb 6.8  eICU Interventions  One unit PRBCs     Intervention Category Intermediate Interventions: Bleeding - evaluation and treatment with blood products  Merton Border 09/21/2015, 11:25 PM

## 2015-09-21 NOTE — Consult Note (Signed)
Referring Provider: [facility] Primary Care Physician:  Dwan Bolt, MD Primary Gastroenterologist:  unassigned  Reason for Consultation:  Melena, anemia    HPI: Hunter Santos is a 65 y.o. male who underwent a left total knee arthroplasty in October 2013. In December 2014 he had left resection of total knee arthroplasty with placement of antibiotic spacers due to infection. In February 2015 he had a total knee revision. He is status post recent admission to [facility] 09/11/2015 through 09/17/2015 due to infection in the left total knee arthroplasty. He was noted to have severe sepsis secondary to left knee infection/group B streptococcal bacteremia. He was evaluated by infectious disease and was initially on IV vancomycin, transition to IV Rocephin on February 28 and was to continue for 6 weeks or approximately 10/22/2015. He was discharged to rehabilitation at [facility] [place]. He has been on prophylactic Lovenox daily and received his last dose at 9:30 this morning per the nurse at [facility]. Patient reports that he has had increasing dyspnea. This morning he started having jet black stools. He denies abdominal pain, nausea, or vomiting. His wife went to visit him this morning and noted he was very pale. She states she asked the provider at rehabilitation to evaluate him and he was subsequently sent to the emergency room. In the ER he was noted to have a hemoglobin of 5.6 down from 11.2 4 days ago. BUN was noted to be 61 with a creatinine of 1.4. Patient was hypotensive and tachycardic. Blood was ordered. Past medical history significant for diabetes, arthritis, sleep apnea, and hypertension. Surgeries aside from the above-mentioned knee surgeries include left ankle surgery, nasal surgery, tonsillectomy.    Past Surgical History  Procedure Laterality Date  . Tonsillectomy      as child  . Left ankle  2006  . Nasal septum surgery  2011  . Total knee  arthroplasty  04/29/2012    Procedure: TOTAL KNEE ARTHROPLASTY;  Surgeon: Mauri Pole, MD;  Location: [facility];  Service: Orthopedics;  Laterality: Left;  . Excisional total knee arthroplasty Left 06/15/2013    Procedure: LEFT RESECTION TOTAL KNEE ARTHROPLASTY WITH PLACEMENT OF ANTIBIOTIC SPACERS;  Surgeon: Mauri Pole, MD;  Location: [facility];  Service: Orthopedics;  Laterality: Left;  . Total knee revision Left 08/17/2013    Procedure: REIMPLANTATION LEFT TOTAL KNEE ARTHROPLASTY WITH REMOVAL OF ANTIBIOTIC SPACER ;  Surgeon: Mauri Pole, MD;  Location: [facility];  Service: Orthopedics;  Laterality: Left;  . I&d knee with poly exchange Left 09/12/2015    Procedure: IRRIGATION AND DEBRIDEMENT KNEE WITH POLY EXCHANGE LEFT TOTAL KNEE;  Surgeon: Paralee Cancel, MD;  Location: [facility];  Service: Orthopedics;  Laterality: Left;    Prior to Admission medications   Medication Sig Start Date End Date Taking? Authorizing Provider  acetaminophen (TYLENOL) 325 MG tablet Take 2 tablets (650 mg total) by mouth every 6 (six) hours as needed for mild pain (or Fever >/= 101). 09/17/15  Yes Silver Huguenin Elgergawy, MD  amLODipine (NORVASC) 10 MG tablet Take 10 mg by mouth daily before breakfast.   Yes Historical Provider, MD  aspirin 81 MG tablet Take 81 mg by mouth daily.   Yes Historical Provider, MD  cefTRIAXone 2 g in dextrose 5 % 50 mL Inject 2 g into the vein daily. Continue until 10/22/2015 09/17/15  Yes Dawood S Elgergawy, MD  enoxaparin (LOVENOX) 40 MG/0.4ML injection Inject 0.4 mLs (40 mg total) into the skin  daily. To continue for 30 days for DVT prophylaxis then stop. 09/17/15  Yes Albertine Patricia, MD  furosemide (LASIX) 40 MG tablet Take 40 mg by mouth daily before breakfast.   Yes Historical Provider, MD  glimepiride (AMARYL) 4 MG tablet Take 4 mg by mouth daily before breakfast.   Yes Historical Provider, MD  insulin aspart (NOVOLOG) 100 UNIT/ML injection Inject 0-9 Units into the skin 3 (three) times daily with  meals. Patient taking differently: Inject 5 Units into the skin 3 (three) times daily with meals.  09/17/15  Yes Silver Huguenin Elgergawy, MD  Menthol, Topical Analgesic, (BLUE-EMU MAXIMUM STRENGTH EX) Apply 1 application topically 2 (two) times daily as needed (muscle strain). Patient uses on shoulders   Yes Historical Provider, MD  metFORMIN (GLUCOPHAGE) 500 MG tablet Take 500-1,000 mg by mouth 2 (two) times daily with a meal. Take 1000mg  in the morning, and then 500mg  at night   Yes Historical Provider, MD  methocarbamol (ROBAXIN) 500 MG tablet Take 1 tablet (500 mg total) by mouth every 8 (eight) hours as needed for muscle spasms. 09/17/15  Yes Silver Huguenin Elgergawy, MD  ondansetron (ZOFRAN) 4 MG tablet Take 1 tablet (4 mg total) by mouth every 6 (six) hours as needed for nausea. 09/17/15  Yes Albertine Patricia, MD  oxyCODONE (OXY IR/ROXICODONE) 5 MG immediate release tablet Take 1 tablet (5 mg total) by mouth every 6 (six) hours as needed for severe pain. 09/17/15  Yes Albertine Patricia, MD  phosphorus (K PHOS NEUTRAL) 155-852-130 MG tablet Take 1 tablet (250 mg total) by mouth daily. 09/17/15  Yes Silver Huguenin Elgergawy, MD  pravastatin (PRAVACHOL) 40 MG tablet Take 40 mg by mouth every evening.   Yes Historical Provider, MD  ramipril (ALTACE) 10 MG tablet Take 10 mg by mouth daily before breakfast.   Yes Historical Provider, MD    Current Facility-Administered Medications  Medication Dose Route Frequency Provider Last Rate Last Dose  . 0.9 %  sodium chloride infusion  10 mL/hr Intravenous Once Virgel Manifold, MD      . norepinephrine (LEVOPHED) 4 mg in dextrose 5 % 250 mL (0.016 mg/mL) infusion  0-40 mcg/min Intravenous Titrated Virgel Manifold, MD      . pantoprazole (PROTONIX) 80 mg in sodium chloride 0.9 % 250 mL (0.32 mg/mL) infusion  8 mg/hr Intravenous Continuous Virgel Manifold, MD      . sodium chloride 0.9 % bolus 1,000 mL  1,000 mL Intravenous Once Virgel Manifold, MD      . vancomycin (VANCOCIN) 2,000 mg in  sodium chloride 0.9 % 500 mL IVPB  2,000 mg Intravenous Once Virgel Manifold, MD 250 mL/hr at 09/21/15 1457 2,000 mg at 09/21/15 1457   Current Outpatient Prescriptions  Medication Sig Dispense Refill  . acetaminophen (TYLENOL) 325 MG tablet Take 2 tablets (650 mg total) by mouth every 6 (six) hours as needed for mild pain (or Fever >/= 101).    Marland Kitchen amLODipine (NORVASC) 10 MG tablet Take 10 mg by mouth daily before breakfast.    . aspirin 81 MG tablet Take 81 mg by mouth daily.    . cefTRIAXone 2 g in dextrose 5 % 50 mL Inject 2 g into the vein daily. Continue until 10/22/2015    . enoxaparin (LOVENOX) 40 MG/0.4ML injection Inject 0.4 mLs (40 mg total) into the skin daily. To continue for 30 days for DVT prophylaxis then stop. 0 Syringe   . furosemide (LASIX) 40 MG tablet Take 40 mg by  mouth daily before breakfast.    . glimepiride (AMARYL) 4 MG tablet Take 4 mg by mouth daily before breakfast.    . insulin aspart (NOVOLOG) 100 UNIT/ML injection Inject 0-9 Units into the skin 3 (three) times daily with meals. (Patient taking differently: Inject 5 Units into the skin 3 (three) times daily with meals. ) 10 mL 11  . Menthol, Topical Analgesic, (BLUE-EMU MAXIMUM STRENGTH EX) Apply 1 application topically 2 (two) times daily as needed (muscle strain). Patient uses on shoulders    . metFORMIN (GLUCOPHAGE) 500 MG tablet Take 500-1,000 mg by mouth 2 (two) times daily with a meal. Take 1000mg  in the morning, and then 500mg  at night    . methocarbamol (ROBAXIN) 500 MG tablet Take 1 tablet (500 mg total) by mouth every 8 (eight) hours as needed for muscle spasms. 20 tablet 0  . ondansetron (ZOFRAN) 4 MG tablet Take 1 tablet (4 mg total) by mouth every 6 (six) hours as needed for nausea. 20 tablet 0  . oxyCODONE (OXY IR/ROXICODONE) 5 MG immediate release tablet Take 1 tablet (5 mg total) by mouth every 6 (six) hours as needed for severe pain. 30 tablet 0  . phosphorus (K PHOS NEUTRAL) 155-852-130 MG tablet Take 1  tablet (250 mg total) by mouth daily.    . pravastatin (PRAVACHOL) 40 MG tablet Take 40 mg by mouth every evening.    . ramipril (ALTACE) 10 MG tablet Take 10 mg by mouth daily before breakfast.      Allergies as of 09/21/2015  . (No Known Allergies)    Family History  Problem Relation Age of Onset  . Cancer Father     Lung    Social History   Social History  . Marital Status: Married    Spouse Name: N/A  . Number of Children: N/A  . Years of Education: N/A   Occupational History  . Not on file.   Social History Main Topics  . Smoking status: Never Smoker   . Smokeless tobacco: Never Used  . Alcohol Use: No  . Drug Use: No  . Sexual Activity: Not on file   Other Topics Concern  . Not on file   Social History Narrative    Review of Systems: Patient very fatigued, sleepy, answer some questions but is difficult to understand.  Physical Exam: Vital signs in last 24 hours: Temp:  [98 F (36.7 C)] 98 F (36.7 C) (03/08 1443) Pulse Rate:  [106-116] 106 (03/08 1443) Resp:  [32-36] 32 (03/08 1443) BP: (72-86)/(48-57) 86/57 mmHg (03/08 1443) SpO2:  [97 %-100 %] 97 % (03/08 1443) Weight:  [270 lb (122.471 kg)] 270 lb (122.471 kg) (03/08 1327)   General:   pale, chronically E male appears in mild distress  Head:  Normocephalic and atraumatic. Eyes:  sclera nonicteric, conjunctiva pale  Mouth:   Mucous membranes dry. Lungs:  Clear throughout to auscultation.   No wheezes, crackles, or rhonchi.  Heart:  Tachycardic Abdomen:  Soft,nontender, BS active,nonpalp mass or hsm.scattered ecchymosis.   Rectal:  Deferred . ER exam with frank melena. Stool heme-positive.  Msk:  Symmetrical without gross deformities. . Pulses:  Normal pulses noted. Extremities: left knee with dressing intact. Neurologic:appears fatigued. Answers questions slowly but appropriately Skin: warm and dry   Intake/Output from previous day:   Intake/Output this shift: Total I/O In: 835  [I.V.:500; Blood:335] Out: -   Lab Results:  Recent Labs  09/21/15 1300  WBC 38.2*  HGB 5.6*  HCT 17.7*  PLT 917*   BMET  Recent Labs  09/21/15 1300  NA 134*  K 5.7*  CL 100*  CO2 14*  GLUCOSE 359*  BUN 61*  CREATININE 1.40*  CALCIUM 7.7*   LFT  Recent Labs  09/21/15 1300  PROT 4.5*  ALBUMIN 1.2*  AST 28  ALT 18  ALKPHOS 41  BILITOT 0.6      Studies/Results: Dg Chest Portable 1 View  09/21/2015  CLINICAL DATA:  Fever EXAM: PORTABLE CHEST 1 VIEW COMPARISON:  09/11/2015 FINDINGS: Cardiac shadow is stable. The lungs are well aerated bilaterally. No focal infiltrate or sizable effusion is seen. Interval placement of right PICC line is noted in satisfactory position. IMPRESSION: No acute abnormality noted. Electronically Signed   By: Inez Catalina M.D.   On: 09/21/2015 13:44    IMPRESSION/PLAN:  65 year old male status post revision of total knee replacement with postoperative infection, admitted from rehabilitation with hypertension and tachycardia. Noted to have hemoglobin of 5.6 down from 11.2 4 days earlier. BUN and creatinine elevated at 61 and 1.4 respectively, likely secondary to upper GI bleed.( ? Ischemic vs bleed from lovenox) Patient was on Lovenox and received his last dose at 9:30 this morning. White count is noted to be elevated at 38.2-orthopedics to see patient. May have sepsis as well however he has been on Rocephin. Has had central venous catheter inserted for fluid resuscitation, would transfuse to a hemoglobin of 7, trend hemoglobin. Protonix drip. Will need EGD for further evaluation when hemodynamically stabilized. Hold her Lovenox. Has been started on levothyroxine for septic shock and will continue antibiotics. PICC line likely to be removed.Cont supportive care per CCM.   Hvozdovic, Deloris Ping 09/21/2015,  Pager [phone]  Mon-Fri 8a-5p [phone] after 5p, weekends, holidays     [facility] Attending   I have taken an interval history, reviewed  the chart and examined the patient. I agree with the Advanced Practitioner's note, impression and recommendations.    Upper GI bleed (likely) with melena and severe acute blood loss anemia in setting of suspected sepsis. ? Stress ulceration vs shock-related gut ischemia and bleeding. He is also anticoagulated. He is improved with PCCM care but remains critically ill.  Will f/u in AM - he needs blood and stabilization prior to endoscopy and need to let enoxaparin wear off.  Will check coags also.  Gatha Mayer, MD, [facility] Gastroenterology [phone] (pager) [phone] after 5 PM, weekends and holidays  09/21/2015 5:02 PM

## 2015-09-21 NOTE — Progress Notes (Signed)
Pharmacy Antibiotic Note  Hunter Santos is a 65 y.o. male admitted on 09/21/2015 with sepsis.  Zosyn is ordered x 1 in the ED and pharmacy is asked to start Vancomycin. Brought from [facility] via [place] on 3/8 d/t dark tarry stool, hypotension, hyperglycemic, and lethargic. Lactic acid is elevated. Has PICC line.   Recent admission 2/26-3/4 with knee infection. Pharmacy was dosing Vanc and Zosyn. Underwent I&D on 2/27 and fluid grew Strep.agalactiae. Discharged on Rocephin.  Plan: Give Vancomycin 2g x 1 then 1250mg  IV q12h. Measure Vanc trough at steady state. Follow up renal fxn, culture results, and clinical course.  Weight: 270 lb (122.471 kg)  No data recorded.   Recent Labs Lab 09/15/15 0430 09/16/15 0500 09/17/15 0315 09/21/15 1317  WBC 17.1* 18.6* 14.2*  --   CREATININE 0.64 0.72 0.56*  --   LATICACIDVEN  --   --   --  12.27*    Estimated Creatinine Clearance: 126.1 mL/min (by C-G formula based on Cr of 0.56).    No Known Allergies  Antimicrobials this admission: Zosyn 3/8 x 1  Vanc 3/8 >>   Dose adjustments this admission:  Microbiology results: BCx: UCx:  Sputum: MRSA PCR:  Thank you for allowing pharmacy to be a part of this patient's care.  Romeo Rabon, PharmD, pager [phone]. 09/21/2015,1:44 PM.

## 2015-09-21 NOTE — H&P (Signed)
PULMONARY / CRITICAL CARE MEDICINE   Name: Hunter Santos MRN: [id number] DOB: March 28, 1951    ADMISSION DATE:  09/21/2015 CONSULTATION DATE:  09/21/2015  REFERRING MD:  EDP  CHIEF COMPLAINT:  Septic shock  HISTORY OF PRESENT ILLNESS:   65 year old male with PMH below who recently had a knee replacement then scrapped his knee.  On 2/26 he was admitted to the hospital for debridement of the left knee with concern for infection then was sent to rehab with lovenox for DVT prevention.  On 3/8 in rehab he was noted to be altered and febrile.  Patient was sent to [facility] and was noted to be febrile, hypotensive, in lactic acidosis and anemic and PCCM was consulted for admission.  Also reported is melenotic stool.  PAST MEDICAL HISTORY :  He  has a past medical history of Hypertension; Diabetes mellitus; Arthritis; History of transfusion; and Sleep apnea.  PAST SURGICAL HISTORY: He  has past surgical history that includes Tonsillectomy; left ankle (2006); Nasal septum surgery (2011); Total knee arthroplasty (04/29/2012); Excisional total knee arthroplasty (Left, 06/15/2013); Total knee revision (Left, 08/17/2013); and I&D knee with poly exchange (Left, 09/12/2015).  No Known Allergies  No current facility-administered medications on file prior to encounter.   Current Outpatient Prescriptions on File Prior to Encounter  Medication Sig  . acetaminophen (TYLENOL) 325 MG tablet Take 2 tablets (650 mg total) by mouth every 6 (six) hours as needed for mild pain (or Fever >/= 101).  Marland Kitchen amLODipine (NORVASC) 10 MG tablet Take 10 mg by mouth daily before breakfast.  . aspirin 81 MG tablet Take 81 mg by mouth daily.  . cefTRIAXone 2 g in dextrose 5 % 50 mL Inject 2 g into the vein daily. Continue until 10/22/2015  . enoxaparin (LOVENOX) 40 MG/0.4ML injection Inject 0.4 mLs (40 mg total) into the skin daily. To continue for 30 days for DVT prophylaxis then stop.  . furosemide (LASIX) 40 MG tablet Take 40 mg by  mouth daily before breakfast.  . glimepiride (AMARYL) 4 MG tablet Take 4 mg by mouth daily before breakfast.  . insulin aspart (NOVOLOG) 100 UNIT/ML injection Inject 0-9 Units into the skin 3 (three) times daily with meals. (Patient taking differently: Inject 5 Units into the skin 3 (three) times daily with meals. )  . Menthol, Topical Analgesic, (BLUE-EMU MAXIMUM STRENGTH EX) Apply 1 application topically 2 (two) times daily as needed (muscle strain). Patient uses on shoulders  . metFORMIN (GLUCOPHAGE) 500 MG tablet Take 500-1,000 mg by mouth 2 (two) times daily with a meal. Take 1000mg  in the morning, and then 500mg  at night  . methocarbamol (ROBAXIN) 500 MG tablet Take 1 tablet (500 mg total) by mouth every 8 (eight) hours as needed for muscle spasms.  . ondansetron (ZOFRAN) 4 MG tablet Take 1 tablet (4 mg total) by mouth every 6 (six) hours as needed for nausea.  Marland Kitchen oxyCODONE (OXY IR/ROXICODONE) 5 MG immediate release tablet Take 1 tablet (5 mg total) by mouth every 6 (six) hours as needed for severe pain.  . phosphorus (K PHOS NEUTRAL) 155-852-130 MG tablet Take 1 tablet (250 mg total) by mouth daily.  . pravastatin (PRAVACHOL) 40 MG tablet Take 40 mg by mouth every evening.  . ramipril (ALTACE) 10 MG tablet Take 10 mg by mouth daily before breakfast.    FAMILY HISTORY:  His indicated that his mother is alive. He indicated that his father is deceased.   SOCIAL HISTORY: He  reports that  he has never smoked. He has never used smokeless tobacco. He reports that he does not drink alcohol or use illicit drugs.  REVIEW OF SYSTEMS:   Patient is encephalopathic and wife is not living with him right now.  SUBJECTIVE:  Dizzy and SOB but no other complaints.  VITAL SIGNS: BP 86/57 mmHg  Pulse 106  Temp(Src) 98 F (36.7 C) (Oral)  Resp 32  Wt 122.471 kg (270 lb)  SpO2 97%  HEMODYNAMICS:    VENTILATOR SETTINGS:    INTAKE / OUTPUT:    PHYSICAL EXAMINATION: General:  Chronically ill  appearing male in mild respiratory distress. Neuro:  Arousable but difficult to understand (mouth very dry). HEENT:  [place]/AT, PERRL, EOM-I and DMM. Cardiovascular:  Regular, tachy, Nl S1/S2, -M/R/G. Lungs:  CTA bilaterally. Abdomen:  Soft, NT, ND and +BS. Musculoskeletal:  -edema and -tenderness except for left knee that has a dressing on it, patella is bobbing and clear evidence of fluid under the skin likely in the joint. Skin:  WNL except left knee as above.  LABS:  BMET  Recent Labs Lab 09/16/15 0500 09/17/15 0315 09/21/15 1300  NA 140 136 134*  K 3.1* 3.4* 5.7*  CL 105 101 100*  CO2 24 27 14*  BUN 17 11 61*  CREATININE 0.72 0.56* 1.40*  GLUCOSE 128* 130* 359*    Electrolytes  Recent Labs Lab 09/16/15 0500 09/17/15 0315 09/21/15 1300  CALCIUM 8.0* 7.8* 7.7*    CBC  Recent Labs Lab 09/16/15 0500 09/17/15 0315 09/21/15 1300  WBC 18.6* 14.2* 38.2*  HGB 11.8* 11.2* 5.6*  HCT 37.0* 34.8* 17.7*  PLT 306 311 917*    Coag's No results for input(s): APTT, INR in the last 168 hours.  Sepsis Markers  Recent Labs Lab 09/21/15 1317  LATICACIDVEN 12.27*    ABG No results for input(s): PHART, PCO2ART, PO2ART in the last 168 hours.  Liver Enzymes  Recent Labs Lab 09/21/15 1300  AST 28  ALT 18  ALKPHOS 41  BILITOT 0.6  ALBUMIN 1.2*    Cardiac Enzymes No results for input(s): TROPONINI, PROBNP in the last 168 hours.  Glucose  Recent Labs Lab 09/16/15 1642 09/16/15 2005 09/16/15 2356 09/17/15 0426 09/17/15 0828 09/17/15 1215  GLUCAP 199* 179* 201* 127* 174* 295*    Imaging Dg Chest Portable 1 View  09/21/2015  CLINICAL DATA:  Fever EXAM: PORTABLE CHEST 1 VIEW COMPARISON:  09/11/2015 FINDINGS: Cardiac shadow is stable. The lungs are well aerated bilaterally. No focal infiltrate or sizable effusion is seen. Interval placement of right PICC line is noted in satisfactory position. IMPRESSION: No acute abnormality noted. Electronically Signed    By: Inez Catalina M.D.   On: 09/21/2015 13:44     STUDIES:  CXR 3/8 is clear.  CULTURES: Blood 3/8>>> Urine 3/8>>> Sputum 3/8>>>  ANTIBIOTICS: Vancomycin 3/8>>> Zosyn 3/8>>>  SIGNIFICANT EVENTS:  3/8>>>Admit to the ICU for septic shock.  LINES/TUBES: L IJ TLC 3/8>>>  DISCUSSION: 65 year old male with recent knee surgery on lovenox for DVT prophylaxis in the SNF presenting with likely an infected knee and UGI bleeding.  Will admit to the ICU, resuscitate via septic shock protocol.  EDP called GI and ortho.  ASSESSMENT / PLAN:  PULMONARY A: Tachypnea to compensate for metabolic acidosis. P:   - Titrate O2 for sat of 88-92%. - Monitor for signs of fatigue in the ICU.  CARDIOVASCULAR A:  Septic shock, can not r/o hemorrhagic as well. P:  - Transfuse blood. -  IVF. - Levophed. - Treat infection. - 2D echo when more stable. - Troponins.  RENAL A:   ARF, metabolic acidosis and hyperkalemia. P:   - Hydrate. - BMET at 8 PM. - BMET in AM. - Replace electrolytes as indicated.  GASTROINTESTINAL A:   GI Bleeding likely upper. P:   - Protonix drip. - GI consult called. - Hold anti-coagulation.  HEMATOLOGIC A:   Post hemorrhagic anemia with GI bleeding. P:  - Hold all anticoagulation. - Transfusion for Hg target of 8.  INFECTIOUS A:   Concern for knee infection. P:   - Vanc/zosyn. - Pan culture. - Ortho consult for aspiration of left knee. - Remove PICC line.  ENDOCRINE A:   DM history.   P:   - Monitor. - ISS. - CBGs.  NEUROLOGIC A:   Lethargic likely due to hypotension. P:   RASS goal: N/A - Minimize sedation. - Minimize pain medications as able.   FAMILY  - Updates: Wife updated bedside.  The patient is critically ill with multiple organ systems failure and requires high complexity decision making for assessment and support, frequent evaluation and titration of therapies, application of advanced monitoring technologies and extensive  interpretation of multiple databases.   Critical Care Time devoted to patient care services described in this note is  35  Minutes. This time reflects time of care of this signee Dr Jennet Maduro. This critical care time does not reflect procedure time, or teaching time or supervisory time of PA/NP/Med student/Med Resident etc but could involve care discussion time.  Rush Farmer, M.D. [facility] Pulmonary/Critical Care Medicine. Pager: [phone]. After hours pager: [phone].  09/21/2015, 3:18 PM

## 2015-09-22 ENCOUNTER — Inpatient Hospital Stay (HOSPITAL_COMMUNITY): Payer: 59

## 2015-09-22 ENCOUNTER — Encounter (HOSPITAL_COMMUNITY)
Admission: EM | Disposition: A | Discharge status: Skilled Nursing Facility | Payer: Self-pay | Source: Home / Self Care | Attending: Internal Medicine

## 2015-09-22 DIAGNOSIS — K2210 Ulcer of esophagus without bleeding: Secondary | ICD-10-CM | POA: Insufficient documentation

## 2015-09-22 DIAGNOSIS — I4510 Unspecified right bundle-branch block: Secondary | ICD-10-CM

## 2015-09-22 DIAGNOSIS — K922 Gastrointestinal hemorrhage, unspecified: Secondary | ICD-10-CM | POA: Insufficient documentation

## 2015-09-22 HISTORY — PX: ESOPHAGOGASTRODUODENOSCOPY: SHX5428

## 2015-09-22 HISTORY — DX: Unspecified right bundle-branch block: I45.10

## 2015-09-22 LAB — CBC
HEMATOCRIT: 21.6 % — AB (ref 39.0–52.0)
HEMATOCRIT: 18.6 % — AB (ref 39.0–52.0)
HEMOGLOBIN: 7.3 g/dL — AB (ref 13.0–17.0)
Hemoglobin: 6.6 g/dL — CL (ref 13.0–17.0)
MCH: 29.6 pg (ref 26.0–34.0)
MCH: 30.1 pg (ref 26.0–34.0)
MCHC: 33.8 g/dL (ref 30.0–36.0)
MCHC: 35.5 g/dL (ref 30.0–36.0)
MCV: 87.4 fL (ref 78.0–100.0)
MCV: 84.9 fL (ref 78.0–100.0)
PLATELETS: 516 10*3/uL — AB (ref 150–400)
Platelets: 507 10*3/uL — ABNORMAL HIGH (ref 150–400)
RBC: 2.47 MIL/uL — ABNORMAL LOW (ref 4.22–5.81)
RBC: 2.19 MIL/uL — ABNORMAL LOW (ref 4.22–5.81)
RDW: 13.9 % (ref 11.5–15.5)
RDW: 14.3 % (ref 11.5–15.5)
WBC: 19.5 10*3/uL — ABNORMAL HIGH (ref 4.0–10.5)
WBC: 20.8 10*3/uL — AB (ref 4.0–10.5)

## 2015-09-22 LAB — HEMOGLOBIN AND HEMATOCRIT, BLOOD
HEMATOCRIT: 21.2 % — AB (ref 39.0–52.0)
HEMOGLOBIN: 7.3 g/dL — AB (ref 13.0–17.0)

## 2015-09-22 LAB — GLUCOSE, CAPILLARY
GLUCOSE-CAPILLARY: 209 mg/dL — AB (ref 65–99)
GLUCOSE-CAPILLARY: 283 mg/dL — AB (ref 65–99)
GLUCOSE-CAPILLARY: 342 mg/dL — AB (ref 65–99)
Glucose-Capillary: 264 mg/dL — ABNORMAL HIGH (ref 65–99)
Glucose-Capillary: 207 mg/dL — ABNORMAL HIGH (ref 65–99)

## 2015-09-22 LAB — PREPARE RBC (CROSSMATCH)

## 2015-09-22 LAB — BASIC METABOLIC PANEL
ANION GAP: 8 (ref 5–15)
BUN: 61 mg/dL — AB (ref 6–20)
CHLORIDE: 108 mmol/L (ref 101–111)
CO2: 22 mmol/L (ref 22–32)
Calcium: 7.5 mg/dL — ABNORMAL LOW (ref 8.9–10.3)
Creatinine, Ser: 1.06 mg/dL (ref 0.61–1.24)
GFR calc Af Amer: >60 mL/min (ref >60)
GFR calc non Af Amer: >60 mL/min (ref >60)
GLUCOSE: 298 mg/dL — AB (ref 65–99)
POTASSIUM: 3.7 mmol/L (ref 3.5–5.1)
Sodium: 138 mmol/L (ref 135–145)

## 2015-09-22 LAB — TROPONIN I: Troponin I: <0.03 ng/mL (ref <0.031)

## 2015-09-22 LAB — PATHOLOGIST SMEAR REVIEW

## 2015-09-22 LAB — PHOSPHORUS: Phosphorus: 3.2 mg/dL (ref 2.5–4.6)

## 2015-09-22 LAB — MAGNESIUM: Magnesium: 1.8 mg/dL (ref 1.7–2.4)

## 2015-09-22 SURGERY — EGD (ESOPHAGOGASTRODUODENOSCOPY)
Anesthesia: Moderate Sedation

## 2015-09-22 MED ORDER — MUPIROCIN 2 % EX OINT
1.0000 | TOPICAL_OINTMENT | Freq: Two times a day (BID) | CUTANEOUS | Status: DC
Start: 2015-09-22 — End: 2015-09-26
  Administered 2015-09-22 – 2015-09-26 (×8): 1 via NASAL
  Filled 2015-09-22 (×2): qty 22

## 2015-09-22 MED ORDER — BUTAMBEN-TETRACAINE-BENZOCAINE 2-2-14 % EX AERO
INHALATION_SPRAY | CUTANEOUS | Status: DC | PRN
  Administered 2015-09-22: 1 via TOPICAL

## 2015-09-22 MED ORDER — FENTANYL CITRATE (PF) 100 MCG/2ML IJ SOLN
INTRAMUSCULAR | Status: DC | PRN
  Administered 2015-09-22: 25 ug via INTRAVENOUS

## 2015-09-22 MED ORDER — SODIUM CHLORIDE 0.9 % IV SOLN
INTRAVENOUS | Status: DC
  Administered 2015-09-24: 10 mL/h via INTRAVENOUS

## 2015-09-22 MED ORDER — MIDAZOLAM HCL 5 MG/ML IJ SOLN
INTRAMUSCULAR | Status: AC
  Filled 2015-09-22: qty 3

## 2015-09-22 MED ORDER — FENTANYL CITRATE (PF) 100 MCG/2ML IJ SOLN
INTRAMUSCULAR | Status: AC
  Filled 2015-09-22: qty 4

## 2015-09-22 MED ORDER — IOHEXOL 300 MG/ML  SOLN
25.0000 mL | INTRAMUSCULAR | Status: AC
  Administered 2015-09-22 (×2): 25 mL via ORAL

## 2015-09-22 MED ORDER — SODIUM CHLORIDE 0.9 % IV SOLN
Freq: Once | INTRAVENOUS | Status: AC
  Administered 2015-09-22: 18:00:00 via INTRAVENOUS

## 2015-09-22 MED ORDER — SODIUM CHLORIDE 0.9 % IV SOLN
INTRAVENOUS | Status: DC
  Administered 2015-09-22: 09:00:00 via INTRAVENOUS

## 2015-09-22 MED ORDER — DIPHENHYDRAMINE HCL 50 MG/ML IJ SOLN
INTRAMUSCULAR | Status: AC
  Filled 2015-09-22: qty 1

## 2015-09-22 MED ORDER — CHLORHEXIDINE GLUCONATE CLOTH 2 % EX PADS
6.0000 | MEDICATED_PAD | Freq: Every day | CUTANEOUS | Status: DC
Start: 2015-09-23 — End: 2015-09-26
  Administered 2015-09-23 – 2015-09-25 (×3): 6 via TOPICAL

## 2015-09-22 MED ORDER — DIPHENHYDRAMINE HCL 50 MG/ML IJ SOLN
INTRAMUSCULAR | Status: DC | PRN
  Administered 2015-09-22: 25 mg via INTRAVENOUS

## 2015-09-22 MED ORDER — MIDAZOLAM HCL 10 MG/2ML IJ SOLN
INTRAMUSCULAR | Status: DC | PRN
  Administered 2015-09-22: 1 mg via INTRAVENOUS

## 2015-09-22 NOTE — Progress Notes (Signed)
Patient ID: Hunter Santos, male   DOB: [birth year], 65 y.o.   MRN: [id number]  Admission notes reviewed S/p I&D of left knee for septic TKR with Group B Strep  Wife and friend in room  A bit disoriented still, lethargic and weak  Left knee post-operative dressing in place, no drainage No surrounding erythema, no warmth - knee looks good, no acute issues appear to be present, clinically  Will continue to follow while in hospital Continue IV antibiotics per ID for this knee and other findings  CPM for left knee while in bed 4-6 hours a day as permitted medically

## 2015-09-22 NOTE — Progress Notes (Signed)
Dr. Carlean Purl and CT notifed patient refuses his CTscan of pelvis today.

## 2015-09-22 NOTE — Progress Notes (Signed)
Nutrition Brief Note  Patient identified on the Malnutrition Screening Tool (MST) Report  Patient with recent weight gain. Will monitor for any nutritional needs.  Wt Readings from Last 15 Encounters:  09/21/15 291 lb 3.6 oz (132.1 kg)  09/21/15 275 lb (124.739 kg)  09/19/15 262 lb (118.842 kg)  09/11/15 262 lb 12.6 oz (119.2 kg)  09/14/13 270 lb (122.471 kg)  08/17/13 261 lb (118.389 kg)  08/10/13 261 lb (118.389 kg)  07/27/13 260 lb (117.935 kg)  06/29/13 252 lb (114.306 kg)  06/15/13 262 lb (118.842 kg)  06/03/13 262 lb (118.842 kg)  04/29/12 276 lb (125.193 kg)  04/18/12 276 lb (125.193 kg)    Body mass index is 38.43 kg/(m^2). Patient meets criteria for obesity based on current BMI.   Current diet order is NPO. Will monitor PO intake for needs. Labs and medications reviewed.   No nutrition interventions warranted at this time. If nutrition issues arise, please consult RD.   Clayton Bibles, MS, RD, LDN Pager: [phone] After Hours Pager: [phone]

## 2015-09-22 NOTE — Op Note (Signed)
[facility] Patient Name: Hunter Santos Procedure Date: 09/22/2015 MRN: [id number] Attending MD: Gatha Mayer , MD Date of Birth: Jul 10, 1951 CSN:  Age: 65 Admit Type: Inpatient Account #: [id number] Procedure:                Upper GI endoscopy Indications:              Melena Providers:                Gatha Mayer, MD, Laverta Baltimore, RN, Cherylynn Ridges, Technician Referring MD:              Medicines:                Midazolam 1 mg IV, Fentanyl 25 micrograms IV,                            Diphenhydramine 25 mg IV, Cetacaine spray Complications:            No immediate complications. Estimated Blood Loss:     Estimated blood loss: none. Procedure:                Pre-Anesthesia Assessment:                           - Prior to the procedure, a History and Physical                            was performed, and patient medications and                            allergies were reviewed. The patient's tolerance of                            previous anesthesia was also reviewed. The risks                            and benefits of the procedure and the sedation                            options and risks were discussed with the patient.                            All questions were answered, and informed consent                            was obtained. Prior Anticoagulants: The patient                            last took Lovenox (enoxaparin) 1 day prior to the                            procedure. ASA Grade Assessment: III - A patient  with severe systemic disease. After reviewing the                            risks and benefits, the patient was deemed in                            satisfactory condition to undergo the procedure.                           After obtaining informed consent, the endoscope was                            passed under direct vision. Throughout the                            procedure,  the patient's blood pressure, pulse, and                            oxygen saturations were monitored continuously. The                            Endoscope was introduced through the mouth, and                            advanced to the second part of duodenum. The upper                            GI endoscopy was accomplished without difficulty.                            The patient tolerated the procedure well. Scope In: Scope Out: Findings:      LA Grade D (one or more mucosal breaks involving at least 75% of       esophageal circumference) esophagitis with no bleeding was found 20 to       40 cm from the incisors. Biopsies were taken with a cold forceps for       histology. Confluent ulceration almost entire esophagus.      The entire examined stomach was normal.      The examined duodenum was normal. Impression:               - LA Grade D reflux esophagitis. Biopsied.                            Confluent ulcer almost entire esophagus                           - Normal stomach.                           - Normal examined duodenum. Moderate Sedation:      Moderate (conscious) sedation was administered by the endoscopy nurse       and supervised by the endoscopist. The following parameters were       monitored: oxygen saturation, heart rate, blood pressure, respiratory  rate, EKG, adequacy of pulmonary ventilation, and response to care.       Total physician intraservice time was 10 minutes. Recommendation:           - Return patient to ICU for ongoing care.                           - Clear liquid diet.                           - Continue present medications. BID PPI                           - Await pathology results.                           - Will order CT abd/pelvis ? if he could have had                            ischemia to bowel as sorce of melena - not clear to                            me that severe ulcerative esophagits seen here is                             cause of bleed. he has been in bed a lot and the                            esophageal changes look like that sort of reflux                            one gets in that case. Procedure Code(s):        --- Professional ---                           [phone], Esophagogastroduodenoscopy, flexible,                            transoral; with biopsy, single or multiple                           G0500, Moderate sedation services provided by the                            same physician or other qualified health care                            professional performing a gastrointestinal                            endoscopic service that sedation supports,                            requiring the presence of an independent trained  observer to assist in the monitoring of the                            patient's level of consciousness and physiological                            status; initial 15 minutes of intra-service time;                            patient age 62 years or older (additional time may                            be reported with [phone], as appropriate) Diagnosis Code(s):        --- Professional ---                           K21.0, Gastro-esophageal reflux disease with                            esophagitis                           K92.1, Melena (includes Hematochezia) CPT copyright 2016 American Medical Association. All rights reserved. The codes documented in this report are preliminary and upon coder review may  be revised to meet current compliance requirements. Gatha Mayer, MD Gatha Mayer, MD 09/22/2015 12:30:02 PM Number of Addenda: 0

## 2015-09-22 NOTE — Progress Notes (Signed)
     [facility] Progress Note  Subjective:   Received 3 units PRBC last evening.  Hemoglobin this morning 7.3. On thank and Zosyn. Off levophed.Had a dark BM with red blood earlier this morning.   Objective:  Vital signs in last 24 hours: Temp:  [97.7 F (36.5 C)-99.2 F (37.3 C)] 97.7 F (36.5 C) (03/09 0800) Pulse Rate:  [42-122] 110 (03/09 0800) Resp:  [8-37] 20 (03/09 0900) BP: (72-115)/(37-70) 96/46 mmHg (03/09 0900) SpO2:  [75 %-100 %] 100 % (03/09 0900) Weight:  [270 lb (122.471 kg)-291 lb 3.6 oz (132.1 kg)] 291 lb 3.6 oz (132.1 kg) (03/08 1741)   General:   Alert,  Well-developed, in NAD Heart:  Regular rate and rhythm; no murmurs Pulm;lungs clear Abdomen:  Soft, nontender and nondistended. Normal bowel sounds, without guarding, and without rebound.     Intake/Output from previous day: 03/08 0701 - 03/09 0700 In: 6949.4 [I.V.:5638.4; Blood:951; IV Piggyback:350] Out: [id number] Intake/Output this shift: Total I/O In: 35 [I.V.:25; Other:10] Out: 150 [Urine:150]  Lab Results:  Recent Labs  09/21/15 1300 09/21/15 1626 09/21/15 2230 09/22/15 0645 09/22/15 0830  WBC 38.2* 25.8*  --  19.5*  --   HGB 5.6* 6.0* 6.8* 7.3* 7.3*  HCT 17.7* 17.9* 20.4* 21.6* 21.2*  PLT 917* 579*  --  507*  --    BMET  Recent Labs  09/21/15 1626 09/21/15 2230 09/22/15 0645  NA 135 135 138  K 5.3* 4.4 3.7  CL 104 106 108  CO2 14* 20* 22  GLUCOSE 327* 377* 298*  BUN 66* 68* 61*  CREATININE 1.32* 1.06 1.06  CALCIUM 7.0* 7.1* 7.5*   LFT  Recent Labs  09/21/15 1626  PROT 3.9*  ALBUMIN 1.1*  AST 37  ALT 22  ALKPHOS 88  BILITOT 0.5   PT/INR  Recent Labs  09/21/15 1626  LABPROT 20.4*  INR 1.83*       ASSESSMENT/PLAN:  65 year old male status post revision of total knee replacement with postoperative infection, admitted from rehabilitation with hypertension and tachycardia. Noted to have hemoglobin of 5.6 down from 11.2 4 days earlier.  BUN and creatinine elevated at 61 and 1.4 respectively, likely secondary to upper GI bleed. Pt rec 3 units prbcs, hgb this am 7.3. Had dark BM with red blood this am. Lovenox--last injection yesterday at 9:30 a.m.--should have washed out by now. Will review with attending--likely for EGD later today.  Active Problems:   Septic shock ([place])     LOS: 1 day   Hvozdovic, Lori P PA-C 09/22/2015, Pager [phone] Mon-Fri 8a-5p [phone] after 5p, weekends, holidays      GI Attending   I have taken an interval history, reviewed the chart and examined the patient. I agree with the Advanced Practitioner's note, impression and recommendations.    ? Primary GI bleed vs sepsis and bleed.  He is better today and appropriate fr EGD. The risks and benefits as well as alternatives of endoscopic procedure(s) have been discussed and reviewed. All questions answered. The patient agrees to proceed.  Gatha Mayer, MD, [facility] [phone] (pager) [phone] after 5 PM, weekends and holidays  09/22/2015 11:31 AM

## 2015-09-22 NOTE — Progress Notes (Addendum)
[facility] Progress Note Patient Name: Hunter Santos DOB: Oct 16, 1950 MRN: [id number]   Date of Service  09/22/2015  HPI/Events of Note  Notified by nurse of repeat Hgb 6.6 down from 7.3 this AM.  eICU Interventions  1. Transfuse 1u PRBC now 2. Repeat Hgb/Hct post transfusion     Intervention Category Intermediate Interventions: Bleeding - evaluation and treatment with blood products  Terrail Zweifel 09/22/2015, 5:28 PM

## 2015-09-22 NOTE — Progress Notes (Signed)
PULMONARY / CRITICAL CARE MEDICINE   Name: EVEN POLIO MRN: [id number] DOB: [birth year]    ADMISSION DATE:  09/21/2015 CONSULTATION DATE:  09/21/2015  REFERRING MD:  EDP  CHIEF COMPLAINT:  Septic shock  BRIEF:  65 y/o male with a history of knee replacement and recent infection with Group B strep of the prosthetic joint treated with antibiotics and surgical irrigation, debridement with poly exchange left total knee was admitted on 3/9 with shock in the setting of blood loss.    SUBJECTIVE:  Feels better today Melena overnight Several blood transfusions overnight  VITAL SIGNS: BP 90/62 mmHg  Pulse 110  Temp(Src) 97.7 F (36.5 C) (Oral)  Resp 17  Ht 6\' 1"  (1.854 m)  Wt 291 lb 3.6 oz (132.1 kg)  BMI 38.43 kg/m2  SpO2 98%  HEMODYNAMICS: CVP:  [6 mmHg-12 mmHg] 6 mmHg  VENTILATOR SETTINGS:    INTAKE / OUTPUT: I/O last 3 completed shifts: In: 6949.4 [I.V.:5638.4; Blood:951; Other:10; IV Piggyback:350] Out: 1775 [Urine:1775]  PHYSICAL EXAMINATION: General:  Chronically ill appearing male awake, alert in bed HENT: NCAT OP clear PULM: CTA B CV: RRR, no mgr GI: BS+, soft, nontender MSK: left knee wound clean, dry, no significant redness or warm, dressing intact Derm: no rash Neuro: awake and alert today, slightly inattentive  LABS:  BMET  Recent Labs Lab 09/21/15 1626 09/21/15 2230 09/22/15 0645  NA 135 135 138  K 5.3* 4.4 3.7  CL 104 106 108  CO2 14* 20* 22  BUN 66* 68* 61*  CREATININE 1.32* 1.06 1.06  GLUCOSE 327* 377* 298*    Electrolytes  Recent Labs Lab 09/21/15 1626 09/21/15 2230 09/22/15 0645  CALCIUM 7.0* 7.1* 7.5*  MG 1.8  --  1.8  PHOS 6.4*  --  3.2    CBC  Recent Labs Lab 09/21/15 1300 09/21/15 1626 09/21/15 2230 09/22/15 0645 09/22/15 0830  WBC 38.2* 25.8*  --  19.5*  --   HGB 5.6* 6.0* 6.8* 7.3* 7.3*  HCT 17.7* 17.9* 20.4* 21.6* 21.2*  PLT 917* 579*  --  507*  --     Coag's  Recent Labs Lab 09/21/15 1626  APTT  28  INR 1.83*    Sepsis Markers  Recent Labs Lab 09/21/15 1626 09/21/15 1859 09/21/15 2245  LATICACIDVEN 8.9* 2.7* 1.7  PROCALCITON 0.67  --   --     ABG No results for input(s): PHART, PCO2ART, PO2ART in the last 168 hours.  Liver Enzymes  Recent Labs Lab 09/21/15 1300 09/21/15 1626  AST 28 37  ALT 18 22  ALKPHOS 41 88  BILITOT 0.6 0.5  ALBUMIN 1.2* 1.1*    Cardiac Enzymes  Recent Labs Lab 09/21/15 1626 09/21/15 2230 09/22/15 0645  TROPONINI <0.03 <0.03 <0.03    Glucose  Recent Labs Lab 09/17/15 0828 09/17/15 1215 09/21/15 1854 09/21/15 1941 09/22/15 0021 09/22/15 0418  GLUCAP 174* 295* 313* 341* 342* 283*    Imaging  CXR 3/9 personally reviewed>  No infiltrate  STUDIES:   CULTURES: Blood 3/8>>> Urine 3/8>>> Sputum 3/8>>>  ANTIBIOTICS: Vancomycin 3/8>>> Zosyn 3/8>>>  SIGNIFICANT EVENTS:  3/8>>>Admit to the ICU for septic shock  LINES/TUBES: L IJ TLC 3/8>>>  DISCUSSION: 65 year old male with recent knee surgery on lovenox for DVT prophylaxis in the SNF presented on 3/8 with shock presumably related to a GI bleed.  There is no clear cause of sepsis.   ASSESSMENT / PLAN:  PULMONARY A: No acute issues P:   Titrate O2 for  sat of 88-92% Monitor for signs of fatigue in the ICU  CARDIOVASCULAR A:  Shock, presumably hemorrhagic Hypertension P:  Tele Monitor off of levophed Restart anticoagulants tomorrow  RENAL A:   AKI P:   Continue IVF Monitor BMET and UOP Replace electrolytes as needed  GASTROINTESTINAL A:   GI Bleeding likely upper P:   Continue protonix drip EGD later today NPO  HEMATOLOGIC A:   Post hemorrhagic anemia with GI bleeding P:  Transfuse to maintain Hgb > 8 gm/dL Monitor serial CBC  INFECTIOUS A:   Recent knee infection with Group B strep > knee looks OK now Leukocytosis on admission but no clear source of infection P:   Continue Vanc/Zosyn F/u culture Will have ortho look at left  knee  Will need to resume ceftriaxone after narrowing antibiotics  ENDOCRINE A:   DM history P:   Monitor glucose, SSI Carb modified diet when read to eat  NEUROLOGIC A:   Lethargic likely due to hypotension > improving P:   Hold sedating meds   FAMILY  - Updates: Wife updated bedside 3/9  The patient is critically ill with multiple organ systems failure and requires high complexity decision making for assessment and support, frequent evaluation and titration of therapies, application of advanced monitoring technologies and extensive interpretation of multiple databases.   Critical Care Time devoted to patient care services described in this note is  35  Minutes. This time reflects time of care of this signee Dr Roselie Awkward. This critical care time does not reflect procedure time, or teaching time or supervisory time of PA/NP/Med student/Med Resident etc but could involve care discussion time.  Roselie Awkward, MD [facility] Pager: [phone] Cell: [phone] After 3pm or if no response, call [phone]   09/22/2015, 10:53 AM

## 2015-09-22 NOTE — Clinical Social Work Note (Signed)
Clinical Social Work Assessment  Patient Details  Name: Hunter Santos MRN: [id number] Date of Birth: 1950-08-18  Date of referral:  09/22/15               Reason for consult:  Facility Placement, Discharge Planning                Permission sought to share information with:  Chartered certified accountant granted to share information::  Yes, Verbal Permission Granted  Name::        Agency::     Relationship::     Contact Information:     Housing/Transportation Living arrangements for the past 2 months:  [place], [place] of Information:  Facility, Spouse Patient Interpreter Needed:  None Criminal Activity/Legal Involvement Pertinent to Current Situation/Hospitalization:  No - Comment as needed Significant Relationships:  Spouse Lives with:  Spouse Do you feel safe going back to the place where you live?  No Need for family participation in patient care:  Yes (Comment)  Care giving concerns:  Spouse feels SNF wasn't able to manage pt's medical needs.   Social Worker assessment / plan:  Pt hospitalized on 09/20/15 with septic shock. Pt had been hospitalized at [facility] from 09/11/15 with an infected ( L ) TKA and was d/c to [facility] on 09/17/15. CSW attempted to meet with pt to assist with d/c planning. Pt was sleeping soundly. CSW spoke with pt's spouse this afternoon. Spouse has requested a new placement at d/c. CSW will initiate a new SNF search once pt is less critical. CSW will continue to follow to assist with d/c planning needs.  Employment status:  Retired Forensic scientist:  Managed Care PT Recommendations:  Not assessed at this time Information / Referral to community resources:  [place]  Patient/Family's Response to care: Spouse is requesting new placement at d/c.  Patient/Family's Understanding of and Emotional Response to Diagnosis, Current Treatment, and Prognosis:  Spouse is aware of pt's  medical status. " Hunter Santos will most likely need continued placement at d/c. I'd like to look into other option for placement.   Emotional Assessment Appearance:  Appears stated age Attitude/Demeanor/Rapport:  Unable to Assess Affect (typically observed):  Unable to Assess Orientation:  Oriented to Self, Oriented to Place, Oriented to Situation Alcohol / Substance use:  Not Applicable Psych involvement (Current and /or in the community):  No (Comment)  Discharge Needs  Concerns to be addressed:  No discharge needs identified Readmission within the last 30 days:  Yes Current discharge risk:  None Barriers to Discharge:  No Barriers Identified   Hunter Santos, [place] 09/22/2015, 3:52 PM

## 2015-09-23 ENCOUNTER — Other Ambulatory Visit (HOSPITAL_COMMUNITY): Payer: 59

## 2015-09-23 ENCOUNTER — Inpatient Hospital Stay (HOSPITAL_COMMUNITY): Payer: 59

## 2015-09-23 DIAGNOSIS — E0810 Diabetes mellitus due to underlying condition with ketoacidosis without coma: Secondary | ICD-10-CM

## 2015-09-23 DIAGNOSIS — E876 Hypokalemia: Secondary | ICD-10-CM

## 2015-09-23 DIAGNOSIS — I10 Essential (primary) hypertension: Secondary | ICD-10-CM

## 2015-09-23 DIAGNOSIS — N179 Acute kidney failure, unspecified: Secondary | ICD-10-CM

## 2015-09-23 LAB — CBC WITH DIFFERENTIAL/PLATELET
BASOS PCT: 0 %
Basophils Absolute: 0 10*3/uL (ref 0.0–0.1)
Eosinophils Absolute: 0 10*3/uL (ref 0.0–0.7)
Eosinophils Relative: 0 %
HEMATOCRIT: 20.9 % — AB (ref 39.0–52.0)
HEMOGLOBIN: 7 g/dL — AB (ref 13.0–17.0)
LYMPHS PCT: 8 %
Lymphs Abs: 1.3 10*3/uL (ref 0.7–4.0)
MCH: 29.9 pg (ref 26.0–34.0)
MCHC: 33.5 g/dL (ref 30.0–36.0)
MCV: 89.3 fL (ref 78.0–100.0)
MONO ABS: 0.6 10*3/uL (ref 0.1–1.0)
MONOS PCT: 4 %
NEUTROS ABS: 15.6 10*3/uL — AB (ref 1.7–7.7)
NEUTROS PCT: 88 %
Platelets: 352 10*3/uL (ref 150–400)
RBC: 2.34 MIL/uL — ABNORMAL LOW (ref 4.22–5.81)
RDW: 14.9 % (ref 11.5–15.5)
WBC: 17.5 10*3/uL — ABNORMAL HIGH (ref 4.0–10.5)

## 2015-09-23 LAB — CBC
HCT: 22.1 % — ABNORMAL LOW (ref 39.0–52.0)
Hemoglobin: 7.2 g/dL — ABNORMAL LOW (ref 13.0–17.0)
MCH: 29.4 pg (ref 26.0–34.0)
MCHC: 32.6 g/dL (ref 30.0–36.0)
MCV: 90.2 fL (ref 78.0–100.0)
PLATELETS: 509 10*3/uL — AB (ref 150–400)
RBC: 2.45 MIL/uL — AB (ref 4.22–5.81)
RDW: 14.9 % (ref 11.5–15.5)
WBC: 16.5 10*3/uL — ABNORMAL HIGH (ref 4.0–10.5)

## 2015-09-23 LAB — HEMOGLOBIN AND HEMATOCRIT, BLOOD
HEMATOCRIT: 20.1 % — AB (ref 39.0–52.0)
Hemoglobin: 7 g/dL — ABNORMAL LOW (ref 13.0–17.0)

## 2015-09-23 LAB — TYPE AND SCREEN
ABO/RH(D): AB POS
Antibody Screen: NEGATIVE
Unit division: 0
Unit division: 0
Unit division: 0
Unit division: 0

## 2015-09-23 LAB — GLUCOSE, CAPILLARY
GLUCOSE-CAPILLARY: 193 mg/dL — AB (ref 65–99)
GLUCOSE-CAPILLARY: 211 mg/dL — AB (ref 65–99)
Glucose-Capillary: 197 mg/dL — ABNORMAL HIGH (ref 65–99)
Glucose-Capillary: 170 mg/dL — ABNORMAL HIGH (ref 65–99)
Glucose-Capillary: 191 mg/dL — ABNORMAL HIGH (ref 65–99)
Glucose-Capillary: 178 mg/dL — ABNORMAL HIGH (ref 65–99)
Glucose-Capillary: 252 mg/dL — ABNORMAL HIGH (ref 65–99)

## 2015-09-23 LAB — BASIC METABOLIC PANEL
ANION GAP: 6 (ref 5–15)
BUN: 36 mg/dL — ABNORMAL HIGH (ref 6–20)
CALCIUM: 6.9 mg/dL — AB (ref 8.9–10.3)
CHLORIDE: 108 mmol/L (ref 101–111)
CO2: 22 mmol/L (ref 22–32)
Creatinine, Ser: 0.6 mg/dL — ABNORMAL LOW (ref 0.61–1.24)
GFR calc non Af Amer: >60 mL/min (ref >60)
GLUCOSE: 198 mg/dL — AB (ref 65–99)
POTASSIUM: 3.2 mmol/L — AB (ref 3.5–5.1)
Sodium: 136 mmol/L (ref 135–145)

## 2015-09-23 LAB — URINE CULTURE: Culture: 60000

## 2015-09-23 MED ORDER — CLOTRIMAZOLE 10 MG MT TROC
10.0000 mg | Freq: Every day | OROMUCOSAL | Status: DC
  Administered 2015-09-23 (×2): 10 mg via ORAL
  Filled 2015-09-23 (×5): qty 1

## 2015-09-23 MED ORDER — FLUCONAZOLE 100 MG PO TABS
100.0000 mg | ORAL_TABLET | Freq: Every day | ORAL | Status: DC
  Administered 2015-09-24 – 2015-09-26 (×3): 100 mg via ORAL
  Filled 2015-09-23 (×3): qty 1

## 2015-09-23 MED ORDER — FLUCONAZOLE 100 MG PO TABS
200.0000 mg | ORAL_TABLET | Freq: Once | ORAL | Status: AC
  Administered 2015-09-23: 200 mg via ORAL
  Filled 2015-09-23: qty 2

## 2015-09-23 MED ORDER — IOHEXOL 300 MG/ML  SOLN
100.0000 mL | Freq: Once | INTRAMUSCULAR | Status: AC | PRN
  Administered 2015-09-23: 100 mL via INTRAVENOUS

## 2015-09-23 MED ORDER — ENOXAPARIN SODIUM 40 MG/0.4ML ~~LOC~~ SOLN
40.0000 mg | SUBCUTANEOUS | Status: DC
  Administered 2015-09-23 – 2015-09-25 (×3): 40 mg via SUBCUTANEOUS
  Filled 2015-09-23 (×4): qty 0.4

## 2015-09-23 MED ORDER — DEXTROSE 5 % IV SOLN
2.0000 g | INTRAVENOUS | Status: DC
  Administered 2015-09-23 – 2015-09-26 (×4): 2 g via INTRAVENOUS
  Filled 2015-09-23 (×4): qty 2

## 2015-09-23 MED ORDER — OXYCODONE HCL 5 MG PO TABS: 5.0000 mg | ORAL_TABLET | ORAL | Status: DC | PRN

## 2015-09-23 MED ORDER — BIOTENE DRY MOUTH MT LIQD: 15.0000 mL | OROMUCOSAL | Status: DC | PRN

## 2015-09-23 MED ORDER — FUROSEMIDE 10 MG/ML IJ SOLN
40.0000 mg | Freq: Two times a day (BID) | INTRAMUSCULAR | Status: DC
Start: 2015-09-23 — End: 2015-09-26
  Administered 2015-09-23 – 2015-09-25 (×5): 40 mg via INTRAVENOUS
  Filled 2015-09-23 (×5): qty 4

## 2015-09-23 MED ORDER — POTASSIUM CHLORIDE 10 MEQ/50ML IV SOLN
10.0000 meq | INTRAVENOUS | Status: AC
  Administered 2015-09-23 (×4): 10 meq via INTRAVENOUS
  Filled 2015-09-23 (×4): qty 50

## 2015-09-23 MED ORDER — HYDROCORTISONE NA SUCCINATE PF 100 MG IJ SOLR
50.0000 mg | Freq: Three times a day (TID) | INTRAMUSCULAR | Status: DC
  Administered 2015-09-23 – 2015-09-24 (×3): 50 mg via INTRAVENOUS
  Filled 2015-09-23 (×3): qty 2

## 2015-09-23 MED ORDER — ALTEPLASE 2 MG IJ SOLR
2.0000 mg | Freq: Once | INTRAMUSCULAR | Status: DC
  Filled 2015-09-23: qty 2

## 2015-09-23 MED ORDER — IOHEXOL 300 MG/ML  SOLN
25.0000 mL | INTRAMUSCULAR | Status: AC
  Administered 2015-09-23 (×2): 25 mL via ORAL

## 2015-09-23 MED ORDER — SACCHAROMYCES BOULARDII 250 MG PO CAPS
250.0000 mg | ORAL_CAPSULE | Freq: Two times a day (BID) | ORAL | Status: DC
  Administered 2015-09-23 – 2015-09-26 (×7): 250 mg via ORAL
  Filled 2015-09-23 (×8): qty 1

## 2015-09-23 MED ORDER — FUROSEMIDE 10 MG/ML IJ SOLN
40.0000 mg | Freq: Once | INTRAMUSCULAR | Status: AC
  Administered 2015-09-23: 40 mg via INTRAVENOUS
  Filled 2015-09-23: qty 4

## 2015-09-23 MED ORDER — METHOCARBAMOL 500 MG PO TABS
500.0000 mg | ORAL_TABLET | Freq: Three times a day (TID) | ORAL | Status: DC | PRN
  Administered 2015-09-23: 500 mg via ORAL
  Filled 2015-09-23: qty 1

## 2015-09-23 NOTE — Progress Notes (Signed)
PHARMACY CONSULT: Lovenox for VTE prophylaxis   Wt: 132kg BMI:  38 Scr:  0.6 CrCl >30 ml/hr  H/H: 7.2/22.1 (stable after transfusion) Pltc: 509  A/P:  Resume Lovenox 40mg  SQ q24h as receiving PTA  Monitor CBC, signs/symptoms of bleeding and renal function, adjust as needed  Peggyann Juba, PharmD, BCPS Pager: [phone] 09/23/2015 6:58 PM

## 2015-09-23 NOTE — Progress Notes (Signed)
Quick Note:  Pathology shows ulcer and candida Will start fluconazole ______

## 2015-09-23 NOTE — Progress Notes (Addendum)
[facility] PROGRESS NOTE  Hunter Santos [id number] DOB: 1951-03-09 DOA: 09/21/2015 PCP: Dwan Bolt, MD  Brief Summary  65 y/o male with a history of left total knee replacement in October 2013 which became infected in 06/2013 requiring requiring spacer therapy.  He had reimplantation of the left total knee in 2015, but was hospitalized a few weeks prior to this admission with Group B strep bacteremia and infection of the prosthetic joint.  He was treated with antibiotics, surgical irrigation and debridement, and polyethylene exchange.  He was discharged with a PICC line to continue ceftriaxone and lovenox until 10/22/2015.  He developed progressive dyspnea and pallor and on the morning of admission developed jet black stools.  In the ER, his hemoglobin was 5.6 witih an elevated BUN:Cr.  He was hypotensive and tachycardic.  There was concern for sepsis and hemorrhagic shock.    SIGNIFICANT EVENTS: 3/8>>>Admit to the ICU for septic shock, started on levophed 3/9 >> levophed off.  EGD:  LA grade D esophagitis with confluent ulceration of the esophagus 3/10 >> CT ab/pelvis  LINES/TUBES: L IJ TLC 3/8>>>  Assessment/Plan  Shock, presumably hemorrhagic, blood pressures stable off levophed and hemoglobin stable at 7mg /dl from last night until this morning. No further GIB since yesterday's BM. -  Continue close monitoring in stepdown -  Continue to hold blood pressure medications -  D/c IVF since diet advanced to CLD  GI Bleeding likely upper given elevated BUN:Cr, however, according to the gastroenterologist, the ulcerative esophagitis may not fully explain this gentleman's anemia -  Continue PPI -  CT abd/pelvis -  Continue to hold lovenox pending CT results -  If CT demonstrates no obvious masses or concerning findings for imminent bleeding, then plan to resume anticoagulation -  Greatly appreciate GI assistance  Acute blood loss anemia, hgb stable from last night until  this morning -  Transfuse 1 unit PRBC  Wheezing and peripheral edeam are concerning for acute on chronic diastolic heart failure.  Last ECHO 09/13/15 demonstrated EF 55-60% with grade 1 DD, suggestion of RV increased pressure or volume overload\ -  Trial of lasix   Acute renal failure secondary to hemorrhagic shock -  Creatinine continues to trend down -  Monitor creatinine carefully during diuresis  Recent knee infection with Group B strep > knee looks OK now -  Blood, urine, PICC line cultures so far negative -  D/c Vanc/Zosyn -  Resume ceftriaxone which is supposed to continue until 4/8 -  Appreciate orthopedics assistance  DM history, last a1c 8.1 09/12/2015 - Continue SSI  Lethargy due to hypotension, resolved  Leukocytosis, likely secondary to acute anemia and trending down -  Repeat CBC in AM  Hypokalemia, iatrogenic -  IV potassium repletation  Diet:  Awaiting CT scan, then CLD Access:  Right IJ TLC IVF:  off Proph:  SCDs  Code Status: full code Family Communication: patient alone Disposition Plan: pending CT scan abd/pelvis.  Will need to resume anticoagulation if safe and monitor for 24-48 hours for bleeding prior to discharge   Consultants:  PCCM  Gastroenterology, Dr. Carlean Purl  Orthopedics, Dr. Alvan Dame  CULTURES: Blood 3/8>>> NGTD Urine 3/8>>> NGTD Sputum 3/8>>> NGTD PICC tip culture >>> NGTD  ANTIBIOTICS: Vancomycin 3/8>>> 3/10 Zosyn 3/8>>> 3/10 Ceftriaxone 3/10 >>  HPI/Subjective:  Feeling a little sob and wheezy with a cough.  He has some increased lower extremity edema nad his hands are swollen.  Unsure of when his last bleeding was but he thinks it  was yesterday.  Denies abdominal pain and vomiting.    Objective: Filed Vitals:   09/23/15 0500 09/23/15 0600 09/23/15 0800 09/23/15 1200  BP:  147/52 136/64 138/65  Pulse:      Temp:      TempSrc:      Resp: 20 17 20 26   Height:      Weight:      SpO2: 95% 94% 94% 96%    Intake/Output  Summary (Last 24 hours) at 09/23/15 1247 Last data filed at 09/23/15 1134  Gross per 24 hour  Intake   3710 ml  Output   2226 ml  Net   1484 ml   Filed Weights   09/21/15 1327 09/21/15 1741  Weight: 122.471 kg (270 lb) 132.1 kg (291 lb 3.6 oz)   Body mass index is 38.43 kg/(m^2).  Exam:   General:  Adult male, No acute distress  HEENT:  NCAT, MMM, right IJ TLC  Cardiovascular:  RRR, nl S1, S2 no mrg, 2+ pulses, warm extremities  Respiratory:  Diminished at the bilateral bases, faint wheeze, no focal rales or rhonchi, no increased WOB  Abdomen:   NABS, soft, NT/ND  MSK:   Normal tone and bulk, 2+ pitting bilateral LEE.  SCDs have pushed a lot of the fluid from his legs  Neuro:  Grossly moves all extremities.  LLE movement limited by pain.    Data Reviewed: Basic Metabolic Panel:  Recent Labs Lab 09/21/15 1300 09/21/15 1626 09/21/15 2230 09/22/15 0645 09/23/15 0500  NA 134* 135 135 138 136  K 5.7* 5.3* 4.4 3.7 3.2*  CL 100* 104 106 108 108  CO2 14* 14* 20* 22 22  GLUCOSE 359* 327* 377* 298* 198*  BUN 61* 66* 68* 61* 36*  CREATININE 1.40* 1.32* 1.06 1.06 0.60*  CALCIUM 7.7* 7.0* 7.1* 7.5* 6.9*  MG  --  1.8  --  1.8  --   PHOS  --  6.4*  --  3.2  --    Liver Function Tests:  Recent Labs Lab 09/21/15 1300 09/21/15 1626  AST 28 37  ALT 18 22  ALKPHOS 41 88  BILITOT 0.6 0.5  PROT 4.5* 3.9*  ALBUMIN 1.2* 1.1*   No results for input(s): LIPASE, AMYLASE in the last 168 hours. No results for input(s): AMMONIA in the last 168 hours. CBC:  Recent Labs Lab 09/21/15 1300 09/21/15 1626  09/22/15 0645 09/22/15 0830 09/22/15 1649 09/22/15 2359 09/23/15 0500  WBC 38.2* 25.8*  --  19.5*  --  20.8*  --  17.5*  NEUTROABS 34.8*  --   --   --   --   --   --  15.6*  HGB 5.6* 6.0*  < > 7.3* 7.3* 6.6* 7.0* 7.0*  HCT 17.7* 17.9*  < > 21.6* 21.2* 18.6* 20.1* 20.9*  MCV 92.2 90.9  --  87.4  --  84.9  --  89.3  PLT 917* 579*  --  507*  --  516*  --  352  < > =  values in this interval not displayed.  Recent Results (from the past 240 hour(s))  Culture, blood (routine x 2)     Status: None (Preliminary result)   Collection Time: 09/21/15  1:00 PM  Result Value Ref Range Status   Specimen Description BLOOD RIGHT ARM  Final   Special Requests BOTTLES DRAWN AEROBIC AND ANAEROBIC 5CC  Final   Culture   Final    NO GROWTH 2 DAYS Performed at [facility]    Report Status PENDING  Incomplete  Urine culture     Status: None (Preliminary result)   Collection Time: 09/21/15  2:11 PM  Result Value Ref Range Status   Specimen Description URINE, CATHETERIZED  Final   Special Requests NONE  Final   Culture   Final    TOO YOUNG TO READ Performed at [facility]    Report Status PENDING  Incomplete  Culture, blood (x 2)     Status: None (Preliminary result)   Collection Time: 09/21/15  4:26 PM  Result Value Ref Range Status   Specimen Description BLOOD  Final   Special Requests   Final    BOTTLES DRAWN AEROBIC AND ANAEROBIC 5 CC EXTERNAL JUGLAR   Culture   Final    NO GROWTH 2 DAYS Performed at [facility]    Report Status PENDING  Incomplete  Cath Tip Culture     Status: None (Preliminary result)   Collection Time: 09/21/15  7:20 PM  Result Value Ref Range Status   Specimen Description CATH TIP PICC LINE  Final   Special Requests Normal  Final   Culture   Final    NO GROWTH 1 DAY Performed at [facility]    Report Status PENDING  Incomplete     Studies: Dg Chest Port 1 View  09/22/2015  CLINICAL DATA:  Shortness of breath. EXAM: PORTABLE CHEST 1 VIEW COMPARISON:  09/21/2015. FINDINGS: Left IJ line in stable position. Mediastinum hilar structures normal. Cardiomegaly pulmonary vascular prominence. Mild bilateral interstitial prominence and small left pleural effusion. Findings consistent mild congestive heart failure. Low lung volumes with mild bibasilar atelectasis. No definite pneumothorax. Tiny density noted  over the left apex most likely skin fold, attention this region on subsequent exams suggested . IMPRESSION: 1. Left IJ line in stable position. Tiny linear density noted over the left pulmonary apex appears to represent a skin fold, attention to this region on subsequent exams suggested. No definite pneumothorax. 2. Cardiomegaly with pulmonary vascular prominence and interstitial prominence consistent with mild congestive heart failure. Small left pleural effusion. Electronically Signed   By: Marcello Moores  Register   On: 09/22/2015 07:23   Dg Chest Port 1 View  09/21/2015  CLINICAL DATA:  Central line placement EXAM: PORTABLE CHEST 1 VIEW COMPARISON:  09/21/2015 FINDINGS: Stable right PICC line. New left internal jugular central line with tip projecting over the superior vena cava. No pneumothorax. Low lung volumes with cardiac enlargement similar to prior study. IMPRESSION: Left IJ central line as described Electronically Signed   By: Skipper Cliche M.D.   On: 09/21/2015 15:58   Dg Chest Portable 1 View  09/21/2015  CLINICAL DATA:  Fever EXAM: PORTABLE CHEST 1 VIEW COMPARISON:  09/11/2015 FINDINGS: Cardiac shadow is stable. The lungs are well aerated bilaterally. No focal infiltrate or sizable effusion is seen. Interval placement of right PICC line is noted in satisfactory position. IMPRESSION: No acute abnormality noted. Electronically Signed   By: Inez Catalina M.D.   On: 09/21/2015 13:44    Scheduled Meds: . alteplase  2 mg Intracatheter Once  . cefTRIAXone (ROCEPHIN)  IV  2 g Intravenous Q24H  . Chlorhexidine Gluconate Cloth  6 each Topical Q0600  . clotrimazole  10 mg Oral 5 X Daily  . hydrocortisone sod succinate (SOLU-CORTEF) inj  50 mg Intravenous Q8H  . insulin aspart  0-15 Units Subcutaneous 6 times per day  . mupirocin ointment  1 application Nasal BID  . [  START ON 09/25/2015] pantoprazole (PROTONIX) IV  40 mg Intravenous Q12H  . saccharomyces boulardii  250 mg Oral BID  . sodium chloride flush   10-40 mL Intracatheter Q12H   Continuous Infusions: . sodium chloride    . pantoprozole (PROTONIX) infusion 8 mg/hr (09/23/15 0904)    Active Problems:   Septic shock (HCC)   UGIB (upper gastrointestinal bleed)   Ulcerative esophagitis    Time spent: 30 min    Korine Winton, [place] [facility] Pager [phone]. If 7PM-7AM, please contact night-coverage at [email or web address], password [facility] 09/23/2015, 12:47 PM  LOS: 2 days

## 2015-09-23 NOTE — Progress Notes (Signed)
[facility] ADULT ICU REPLACEMENT PROTOCOL FOR AM LAB REPLACEMENT ONLY  The patient does apply for the [facility] Adult ICU Electrolyte Replacment Protocol based on the criteria listed below:   1. Is GFR >/= 40 ml/min? Yes.    Patient's GFR today is >60 2. Is urine output >/= 0.5 ml/kg/hr for the last 6 hours? Yes.   Patient's UOP is 0.75 ml/kg/hr 3. Is BUN < 60 mg/dL? Yes.    Patient's BUN today is 36 4. Abnormal electrolyte  K 3.2 5. Ordered repletion with: per protocol 6. If a panic level lab has been reported, has the CCM MD in charge been notified? Yes.  .   Physician:  E Deterding  Christeen Douglas 09/23/2015 5:43 AM

## 2015-09-23 NOTE — Progress Notes (Signed)
     [facility] Progress Note  Subjective:  Hemoglobin this morning 7.0. Received 2 additional units PRBCs yesterday. EGD March 9 with LA grade D esophagitis, biopsied. Confluent ulcer almost entire esophagus. CT scan abd /pelvis was ordered but pt preferred to defer that test til today. Feels well today. Denies abd pain, N/V/D,SOB, CP.  Objective:  Vital signs in last 24 hours: Temp:  [97.9 F (36.6 C)-98.8 F (37.1 C)] 98.8 F (37.1 C) (03/10 0000) Pulse Rate:  [107] 107 (03/09 1128) Resp:  [16-26] 20 (03/10 0800) BP: (89-147)/(39-78) 136/64 mmHg (03/10 0800) SpO2:  [90 %-100 %] 94 % (03/10 0800) Last BM Date: 09/22/15 General:   Alert,  Well-developed,    in NAD Heart:  Regular rate and rhythm; no murmurs Pulm;lungs clear Abdomen:  Soft, nontender and nondistended. Normal bowel sounds, without guarding, and without rebound.   Extremities:  Left knee dressing intact, LLE currently in CPM machine.  Intake/Output from previous day: 03/09 0701 - 03/10 0700 In: 4095 [P.O.:800; I.V.:2250; Blood:335; IV Piggyback:700] Out: 2626 [Urine:2625; Stool:1] Intake/Output this shift:    Lab Results:  Recent Labs  09/22/15 0645  09/22/15 1649 09/22/15 2359 09/23/15 0500  WBC 19.5*  --  20.8*  --  17.5*  HGB 7.3*  < > 6.6* 7.0* 7.0*  HCT 21.6*  < > 18.6* 20.1* 20.9*  PLT 507*  --  516*  --  352  < > = values in this interval not displayed. BMET  Recent Labs  09/21/15 2230 09/22/15 0645 09/23/15 0500  NA 135 138 136  K 4.4 3.7 3.2*  CL 106 108 108  CO2 20* 22 22  GLUCOSE 377* 298* 198*  BUN 68* 61* 36*  CREATININE 1.06 1.06 0.60*  CALCIUM 7.1* 7.5* 6.9*     ASSESSMENT/PLAN:   65 yo male adm with GIB, EGD revealed grade D esophagitis--biopsies pending. For CT to eval for possible ischemic bowel as source of bleeding.(later this morning). Continue  supportive care per hospitalists.   LOS: 2 days   Hvozdovic, Vita Barley PA-C 09/23/2015, Pager [phone]  Mon-Fri 8a-5p [phone] after 5p, weekends, holidays     GI Attending   I have taken an interval history, reviewed the chart and examined the patient. I agree with the Advanced Practitioner's note, impression and recommendations.   CT did not show any source of bleeding or sepsis. Maybe it was the esophagitis though I retain some suspicion of bleeding from small bowel or colon, possibly from low flow ischemia and subsequent ulceration.  He is better overall. At some point in the future he should have a colonoscopy. Most likely after he has recovered unless clinical course indicates a need sooner.  I am not opposed to DVT prophylaxis restarting while her and under observation if needed.   Gatha Mayer, MD, [facility] [phone] (pager) [phone] after 5 PM, weekends and holidays  09/23/2015 3:45 PM

## 2015-09-23 NOTE — NC FL2 (Signed)
[place] LEVEL OF CARE SCREENING TOOL     IDENTIFICATION  Patient Name: Hunter Santos Birthdate: 10-Apr-1951 Sex: male Admission Date (Current Location): 09/21/2015  [facility] and [place] Number:  Herbalist and Address:  [facility],  [place] [address], [place]      Provider Number: [id number]  Attending Physician Name and Address:  Janece Canterbury, MD  Relative Name and Phone Number:       Current Level of Care: Hospital Recommended Level of Care: [place] Prior Approval Number:    Date Approved/Denied:   PASRR Number: [id number]  Discharge Plan: SNF    Current Diagnoses: Patient Active Problem List   Diagnosis Date Noted  . UGIB (upper gastrointestinal bleed)   . Ulcerative esophagitis   . Hemorrhagic shock   . Shoulder pain, left 09/20/2015  . Hyperlipidemia 09/20/2015  . Streptococcal bacteremia 09/12/2015  . Septic shock ([place]) 09/11/2015  . High anion gap metabolic acidosis [id number]  . Acute renal failure (ARF) ([place]) 09/11/2015  . Hypokalemia 09/11/2015  . Severe sepsis ([place]) 09/11/2015  . Lactic acid acidosis 09/11/2015  . S/P revision of total knee 08/17/2013  . DM (diabetes mellitus) ([place]) 06/16/2013  . HTN (hypertension) 06/16/2013  . OSA (obstructive sleep apnea) 06/16/2013  . Infection of prosthetic left knee joint ([place]) 06/16/2013  . S/P left knee resection, abx spacer 06/15/2013  . Postoperative anemia due to acute blood loss 04/30/2012  . Obese 04/30/2012  . Hyponatremia 04/30/2012    Orientation RESPIRATION BLADDER Height & Weight     Self, Time, Situation, Place  O2 Indwelling catheter Weight: 132.1 kg (291 lb 3.6 oz) Height:  6\' 1"  (185.4 cm)  BEHAVIORAL SYMPTOMS/MOOD NEUROLOGICAL BOWEL NUTRITION STATUS  Other (Comment) (no behaviors)   Continent Diet (clears - expect to progress)  AMBULATORY STATUS COMMUNICATION OF NEEDS Skin   Extensive Assist Verbally Surgical wounds                        Personal Care Assistance Level of Assistance  Bathing, Feeding, Dressing Bathing Assistance: Limited assistance Feeding assistance: Independent Dressing Assistance: Limited assistance     Functional Limitations Info  Sight, Hearing, Speech Sight Info: Adequate Hearing Info: Adequate Speech Info: Adequate    SPECIAL CARE FACTORS FREQUENCY                       Contractures      Additional Factors Info  Insulin Sliding Scale, Isolation Precautions               Current Medications (09/23/2015):  This is the current hospital active medication list Current Facility-Administered Medications  Medication Dose Route Frequency Provider Last Rate Last Dose  . 0.9 %  sodium chloride infusion   Intravenous Continuous Janece Canterbury, MD      . alteplase (CATHFLO ACTIVASE) injection 2 mg  2 mg Intracatheter Once Rush Farmer, MD   2 mg at 09/23/15 0245  . antiseptic oral rinse (BIOTENE) solution 15 mL  15 mL Mouth Rinse PRN Janece Canterbury, MD      . cefTRIAXone (ROCEPHIN) 2 g in dextrose 5 % 50 mL IVPB  2 g Intravenous Q24H Janece Canterbury, MD   2 g at 09/23/15 0905  . Chlorhexidine Gluconate Cloth 2 % PADS 6 each  6 each Topical Q0600 Rush Farmer, MD   6 each at 09/23/15 1000  . clotrimazole (MYCELEX) troche 10  mg  10 mg Oral 5 X Daily Janece Canterbury, MD   10 mg at 09/23/15 0956  . hydrocortisone sodium succinate (SOLU-CORTEF) 100 MG injection 50 mg  50 mg Intravenous Q8H Janece Canterbury, MD      . insulin aspart (novoLOG) injection 0-15 Units  0-15 Units Subcutaneous 6 times per day Javier Glazier, MD   3 Units at 09/23/15 0757  . methocarbamol (ROBAXIN) tablet 500 mg  500 mg Oral Q8H PRN Janece Canterbury, MD      . mupirocin ointment (BACTROBAN) 2 % 1 application  1 application Nasal BID Rush Farmer, MD   1 application at [id number]  . oxyCODONE (Oxy IR/ROXICODONE) immediate release tablet 5-10 mg  5-10 mg Oral Q4H PRN Janece Canterbury,  MD      . pantoprazole (PROTONIX) 80 mg in sodium chloride 0.9 % 250 mL (0.32 mg/mL) infusion  8 mg/hr Intravenous Continuous Virgel Manifold, MD 25 mL/hr at 09/23/15 0904 8 mg/hr at 09/23/15 0904  . [START ON 09/25/2015] pantoprazole (PROTONIX) injection 40 mg  40 mg Intravenous Q12H Rush Farmer, MD      . saccharomyces boulardii (FLORASTOR) capsule 250 mg  250 mg Oral BID Janece Canterbury, MD   250 mg at 09/23/15 0957  . sodium chloride flush (NS) 0.9 % injection 10-40 mL  10-40 mL Intracatheter Q12H Rush Farmer, MD   10 mL at 09/23/15 1129  . sodium chloride flush (NS) 0.9 % injection 10-40 mL  10-40 mL Intracatheter PRN Rush Farmer, MD         Discharge Medications: Please see discharge summary for a list of discharge medications.  Relevant Imaging Results:  Relevant Lab Results:   Additional Information SS # [id number], MRSA + pcr 09/11/15, PICC line in place for IV Rocephin 2 g daily through 10/22/15.  Doil Kamara, Randall An, LCSW

## 2015-09-24 DIAGNOSIS — B3781 Candidal esophagitis: Secondary | ICD-10-CM

## 2015-09-24 DIAGNOSIS — K921 Melena: Secondary | ICD-10-CM | POA: Insufficient documentation

## 2015-09-24 LAB — BASIC METABOLIC PANEL
ANION GAP: 7 (ref 5–15)
Anion gap: 10 (ref 5–15)
BUN: 24 mg/dL — ABNORMAL HIGH (ref 6–20)
BUN: 18 mg/dL (ref 6–20)
CALCIUM: 7.3 mg/dL — AB (ref 8.9–10.3)
CO2: 25 mmol/L (ref 22–32)
CO2: 24 mmol/L (ref 22–32)
CREATININE: 0.55 mg/dL — AB (ref 0.61–1.24)
CREATININE: 0.65 mg/dL (ref 0.61–1.24)
Calcium: 7.2 mg/dL — ABNORMAL LOW (ref 8.9–10.3)
Chloride: 106 mmol/L (ref 101–111)
Chloride: 103 mmol/L (ref 101–111)
GFR calc Af Amer: >60 mL/min (ref >60)
GLUCOSE: 293 mg/dL — AB (ref 65–99)
Glucose, Bld: 206 mg/dL — ABNORMAL HIGH (ref 65–99)
POTASSIUM: 3 mmol/L — AB (ref 3.5–5.1)
Potassium: 2.9 mmol/L — ABNORMAL LOW (ref 3.5–5.1)
SODIUM: 138 mmol/L (ref 135–145)
SODIUM: 137 mmol/L (ref 135–145)

## 2015-09-24 LAB — GLUCOSE, CAPILLARY
GLUCOSE-CAPILLARY: 193 mg/dL — AB (ref 65–99)
GLUCOSE-CAPILLARY: 215 mg/dL — AB (ref 65–99)
GLUCOSE-CAPILLARY: 236 mg/dL — AB (ref 65–99)
GLUCOSE-CAPILLARY: 249 mg/dL — AB (ref 65–99)
GLUCOSE-CAPILLARY: 250 mg/dL — AB (ref 65–99)
GLUCOSE-CAPILLARY: 264 mg/dL — AB (ref 65–99)
Glucose-Capillary: 229 mg/dL — ABNORMAL HIGH (ref 65–99)
Glucose-Capillary: 226 mg/dL — ABNORMAL HIGH (ref 65–99)

## 2015-09-24 LAB — CBC
HCT: 21.4 % — ABNORMAL LOW (ref 39.0–52.0)
HEMOGLOBIN: 7 g/dL — AB (ref 13.0–17.0)
MCH: 30 pg (ref 26.0–34.0)
MCHC: 32.7 g/dL (ref 30.0–36.0)
MCV: 91.8 fL (ref 78.0–100.0)
PLATELETS: 455 10*3/uL — AB (ref 150–400)
RBC: 2.33 MIL/uL — AB (ref 4.22–5.81)
RDW: 14.9 % (ref 11.5–15.5)
WBC: 12.6 10*3/uL — AB (ref 4.0–10.5)

## 2015-09-24 LAB — MAGNESIUM: MAGNESIUM: 1.8 mg/dL (ref 1.7–2.4)

## 2015-09-24 MED ORDER — HYDROCORTISONE NA SUCCINATE PF 100 MG IJ SOLR
50.0000 mg | Freq: Two times a day (BID) | INTRAMUSCULAR | Status: DC
  Administered 2015-09-24 – 2015-09-25 (×2): 50 mg via INTRAVENOUS
  Filled 2015-09-24 (×2): qty 1

## 2015-09-24 MED ORDER — SODIUM CHLORIDE 0.9 % IV SOLN
2.0000 g | Freq: Once | INTRAVENOUS | Status: AC
  Administered 2015-09-24: 2 g via INTRAVENOUS
  Filled 2015-09-24: qty 20

## 2015-09-24 MED ORDER — POTASSIUM CHLORIDE CRYS ER 20 MEQ PO TBCR
40.0000 meq | EXTENDED_RELEASE_TABLET | ORAL | Status: AC
  Administered 2015-09-24 (×2): 40 meq via ORAL
  Filled 2015-09-24 (×2): qty 2

## 2015-09-24 MED ORDER — POTASSIUM CHLORIDE CRYS ER 20 MEQ PO TBCR
40.0000 meq | EXTENDED_RELEASE_TABLET | ORAL | Status: DC
  Administered 2015-09-24: 40 meq via ORAL
  Filled 2015-09-24: qty 2

## 2015-09-24 MED ORDER — POTASSIUM CHLORIDE 10 MEQ/100ML IV SOLN
10.0000 meq | INTRAVENOUS | Status: AC
  Administered 2015-09-24 (×2): 10 meq via INTRAVENOUS
  Filled 2015-09-24 (×2): qty 100

## 2015-09-24 MED ORDER — MAGNESIUM SULFATE 2 GM/50ML IV SOLN
2.0000 g | Freq: Once | INTRAVENOUS | Status: AC
  Administered 2015-09-24: 2 g via INTRAVENOUS
  Filled 2015-09-24: qty 50

## 2015-09-24 NOTE — Progress Notes (Signed)
     [facility] Progress Note  Subjective:  Hgb this morning 7.  LA grade D esophagitis, biopsied. Confluent ulcer almost entire esophagus.  Path with ulcer and candida. Fluconazole started. CT abd/pelvis:IMPRESSION: 1. No evidence of source for gastrointestinal bleeding. 2. No retroperitoneal hematoma or free fluid in the abdomen or pelvis 3. Subcapsular splenic fluid collection may represent remote trauma. 4. Bilateral pleural effusions and mild anasarca. Feels well. No complaints.No BM.   Objective:  Vital signs in last 24 hours: Temp:  [97.6 F (36.4 C)-98.2 F (36.8 C)] 97.7 F (36.5 C) (03/11 0800) Resp:  [11-26] 17 (03/11 0800) BP: (115-138)/(59-73) 119/69 mmHg (03/11 0800) SpO2:  [93 %-100 %] 93 % (03/11 0800) Last BM Date: 09/24/15 General:   Alert,  Well-developed,    in NAD Heart:  Regular rate and rhythm; no murmurs Pulm;diminished BS Abdomen:  Soft, nontender and nondistended. Normal bowel sounds, without guarding, and without rebound.  . Lab Results:  Recent Labs  09/23/15 0500 09/23/15 1610 09/24/15 0630  WBC 17.5* 16.5* 12.6*  HGB 7.0* 7.2* 7.0*  HCT 20.9* 22.1* 21.4*  PLT 352 509* 455*    ASSESSMENT/PLAN:  65 yo male adm with GIB, EGD revealed grade D esophagitis, biopsies with ulcer and candida. CT did not show any source of bleeding or sepsis.At some point in the future he should have a colonoscopy. Most likely after he has recovered unless clinical course indicates a need sooner.Trend Hgb. Contniue supportive care per hospitalists.    LOS: 3 days   Hvozdovic, Vita Barley PA-C 09/24/2015, Pager [phone] Mon-Fri 8a-5p [phone] after 5p, weekends, holidays    [facility] Attending   I have taken an interval history, reviewed the chart and examined the patient. I agree with the Advanced Practitioner's note, impression and recommendations.    hgb stable - still w/ small amounts of dark stools - likely old blood. I am not inclined to  pursue a colonoscopy anytime soon given co-morbidities. Suggest feraheme if not done Back on Lovenox - if he rebleeds could force our hand.  21 d fluconazole for Candida esophagitis  He can have f/u GI when physically able to have a colonoscopy or call us back if clinical situation demands sooner.  Signing off  Gatha Mayer, MD, [facility] [phone] (pager) [phone] after 5 PM, weekends and holidays  09/24/2015 3:07 PM

## 2015-09-24 NOTE — Progress Notes (Signed)
[facility] PROGRESS NOTE  Hunter Santos [id number] DOB: [birth year] DOA: 09/21/2015 PCP: Dwan Bolt, MD  Brief Summary  65 y/o male with a history of left total knee replacement in October 2013 which became infected in 06/2013 requiring requiring spacer therapy.  He had reimplantation of the left total knee in 2015, but was hospitalized a few weeks prior to this admission with Group B strep bacteremia and infection of the prosthetic joint.  He was treated with antibiotics, surgical irrigation and debridement, and polyethylene exchange.  He was discharged with a PICC line to continue ceftriaxone and lovenox until 10/22/2015.  He developed progressive dyspnea and pallor and on the morning of admission developed jet black stools.  In the ER, his hemoglobin was 5.6 witih an elevated BUN:Cr.  He was hypotensive and tachycardic.  There was concern for sepsis and hemorrhagic shock.    SIGNIFICANT EVENTS: 3/8>>>Admit to the ICU for septic shock, started on levophed 3/9 >> levophed off.  EGD:  LA grade D esophagitis with confluent ulceration of the esophagus 3/10 >> CT ab/pelvis:  No obvious explanation for anemia  LINES/TUBES: L IJ TLC 3/8>>>  Assessment/Plan  Shock, presumably hemorrhagic, blood pressures low normal.  Off levophed and hemoglobin stable at 7mg /dl for last two days. No BIG in 2 days -  Transfer to floor bed -  Continue to hold blood pressure medications -  D/c IVF since diet advanced to CLD  GI Bleeding likely upper given elevated BUN:Cr, however, according to the gastroenterologist, the ulcerative esophagitis and candidal esophagitis may not fully explain this gentleman's anemia -  Continue PPI -  Fluconazole started on 3/10 -  CT abd/pelvis:  No obvious explanation for bleeding -  resumed lovenox on 3/10 -  Greatly appreciate GI assistance -  May need colonoscopy prior to discharge  Acute blood loss anemia, hgb stable from last night until this  morning -  Start iron supplementation  Wheezing and peripheral edeam are concerning for acute on chronic diastolic heart failure.  Last ECHO 09/13/15 demonstrated EF 55-60% with grade 1 DD, suggestion of RV increased pressure or volume overload -  Continue lasix  -  Daily weights and strict I/O:  -1.375L yesterday  Acute renal failure secondary to hemorrhagic shock, resolved.  Recent knee infection with Group B strep > knee healing -  Continue ceftriaxone which is supposed to continue until 4/8 -  Appreciate orthopedics assistance  DM history, last a1c 8.1 09/12/2015 - Continue SSI  Lethargy due to hypotension, resolved  Leukocytosis, likely secondary to acute anemia and trending down -  Repeat CBC in AM  Hypokalemia, iatrogenic -  IV and oral potassium repletation  Diet:  CLD Access:  Right IJ TLC IVF:  off Proph:  SCDs  Code Status: full code Family Communication: patient alone Disposition Plan: pending stable hemoglobin, further diuresis, likely in a few days   Consultants:  PCCM  Gastroenterology, Dr. Carlean Purl  Orthopedics, Dr. Alvan Dame  CULTURES: Blood 3/8>>> NGTD Urine 3/8>>> NGTD Sputum 3/8>>> NGTD PICC tip culture >>> NGTD  ANTIBIOTICS: Vancomycin 3/8>>> 3/10 Zosyn 3/8>>> 3/10 Ceftriaxone 3/10 >>  HPI/Subjective:  Still sob and wheezy with a cough.  Denies abdominal pain.  Has not had recent bleeding that he knows of.  Denies chest pain but feels a little like food or pills get stuck in his throat when he swallows  Objective: Filed Vitals:   09/24/15 0800 09/24/15 1000 09/24/15 1200 09/24/15 1400  BP: 119/69  98/80 126/72  Pulse:  Temp: 97.7 F (36.5 C)     TempSrc: Oral     Resp: 17 20 22 18   Height:      Weight:      SpO2: 93% 95% 98%     Intake/Output Summary (Last 24 hours) at 09/24/15 1435 Last data filed at 09/24/15 1200  Gross per 24 hour  Intake   1240 ml  Output   3225 ml  Net  -1985 ml   Filed Weights   09/21/15 1327  09/21/15 1741  Weight: 122.471 kg (270 lb) 132.1 kg (291 lb 3.6 oz)   Body mass index is 38.43 kg/(m^2).  Exam:   General:  Adult male, No acute distress  HEENT:  NCAT, MMM, right IJ TLC  Cardiovascular:  RRR, nl S1, S2 no mrg, 2+ pulses, warm extremities  Respiratory:  Diminished at the bilateral bases, faint wheeze, no focal rales or rhonchi, no increased WOB  Abdomen:   NABS, soft, NT/ND  MSK:   Normal tone and bulk, 2+ pitting bilateral LEE.  SCDs have pushed a lot of the fluid from his legs  Neuro:  Grossly moves all extremities.  LLE movement limited by pain.    Data Reviewed: Basic Metabolic Panel:  Recent Labs Lab 09/21/15 1626 09/21/15 2230 09/22/15 0645 09/23/15 0500 09/24/15 0630  NA 135 135 138 136 138  K 5.3* 4.4 3.7 3.2* 2.9*  CL 104 106 108 108 106  CO2 14* 20* 22 22 25   GLUCOSE 327* 377* 298* 198* 206*  BUN 66* 68* 61* 36* 24*  CREATININE 1.32* 1.06 1.06 0.60* 0.55*  CALCIUM 7.0* 7.1* 7.5* 6.9* 7.3*  MG 1.8  --  1.8  --  1.8  PHOS 6.4*  --  3.2  --   --    Liver Function Tests:  Recent Labs Lab 09/21/15 1300 09/21/15 1626  AST 28 37  ALT 18 22  ALKPHOS 41 88  BILITOT 0.6 0.5  PROT 4.5* 3.9*  ALBUMIN 1.2* 1.1*   No results for input(s): LIPASE, AMYLASE in the last 168 hours. No results for input(s): AMMONIA in the last 168 hours. CBC:  Recent Labs Lab 09/21/15 1300  09/22/15 0645  09/22/15 1649 09/22/15 2359 09/23/15 0500 09/23/15 1610 09/24/15 0630  WBC 38.2*  < > 19.5*  --  20.8*  --  17.5* 16.5* 12.6*  NEUTROABS 34.8*  --   --   --   --   --  15.6*  --   --   HGB 5.6*  < > 7.3*  < > 6.6* 7.0* 7.0* 7.2* 7.0*  HCT 17.7*  < > 21.6*  < > 18.6* 20.1* 20.9* 22.1* 21.4*  MCV 92.2  < > 87.4  --  84.9  --  89.3 90.2 91.8  PLT 917*  < > 507*  --  516*  --  352 509* 455*  < > = values in this interval not displayed.  Recent Results (from the past 240 hour(s))  Culture, blood (routine x 2)     Status: None (Preliminary result)    Collection Time: 09/21/15  1:00 PM  Result Value Ref Range Status   Specimen Description BLOOD RIGHT ARM  Final   Special Requests BOTTLES DRAWN AEROBIC AND ANAEROBIC 5CC  Final   Culture   Final    NO GROWTH 2 DAYS Performed at [facility]    Report Status PENDING  Incomplete  Urine culture     Status: None   Collection Time: 09/21/15  2:11 PM  Result Value Ref Range Status   Specimen Description URINE, CATHETERIZED  Final   Special Requests NONE  Final   Culture   Final    60,000 COLONIES/ml YEAST Performed at [facility]    Report Status 09/23/2015 FINAL  Final  Culture, blood (x 2)     Status: None (Preliminary result)   Collection Time: 09/21/15  4:26 PM  Result Value Ref Range Status   Specimen Description BLOOD  Final   Special Requests   Final    BOTTLES DRAWN AEROBIC AND ANAEROBIC 5 CC EXTERNAL JUGLAR   Culture   Final    NO GROWTH 2 DAYS Performed at [facility]    Report Status PENDING  Incomplete  Cath Tip Culture     Status: None (Preliminary result)   Collection Time: 09/21/15  7:20 PM  Result Value Ref Range Status   Specimen Description CATH TIP PICC LINE  Final   Special Requests Normal  Final   Culture   Final    NO GROWTH 2 DAYS Performed at [facility]    Report Status PENDING  Incomplete     Studies: Ct Abdomen Pelvis W Contrast  09/23/2015  CLINICAL DATA:  "65 year old male status post revision of total knee replacement with postoperative infection, admitted from rehabilitation with hypertension and tachycardia. Noted to have hemoglobin of 5.6 down from 11.2. Concern for upper GI bleed. EXAM: CT ABDOMEN AND PELVIS WITH CONTRAST TECHNIQUE: Multidetector CT imaging of the abdomen and pelvis was performed using the standard protocol following bolus administration of intravenous contrast. CONTRAST:  139mL OMNIPAQUE IOHEXOL 300 MG/ML  SOLN COMPARISON:  None. FINDINGS: Lower chest: Bilateral small pleural effusions with  associated passive atelectasis. Hepatobiliary: No focal hepatic lesion. No biliary duct dilatation. Gallbladder is normal. Common bile duct is normal. Pancreas: Pancreas is normal. No ductal dilatation. No pancreatic inflammation. Spleen: 2 low-density subcapsular collection along the anterior margin of the spleen (image 12, series 2. Adrenals/urinary tract: Adrenal glands and kidneys are normal. The ureters and bladder normal. Stomach/Bowel: Stomach, small bowel cecum normal. Appendix not identified. The ascending transverse and descending colon are normal. There several diverticula of the descending colon. The rectosigmoid colon normal. Mild thickening of the distal rectum. Vascular/Lymphatic: Abdominal aorta is normal caliber. There is no retroperitoneal or periportal lymphadenopathy. No pelvic lymphadenopathy. Reproductive: Normal prostate Other: No retroperitoneal hematoma. No intraperitoneal free fluid. Line anasarca of the soft tissues. Musculoskeletal: No aggressive osseous lesion. IMPRESSION: 1. No evidence of source for gastrointestinal bleeding. 2. No retroperitoneal hematoma or free fluid in the abdomen or pelvis 3. Subcapsular splenic fluid collection may represent remote trauma. 4. Bilateral pleural effusions and mild anasarca. Electronically Signed   By: Suzy Bouchard M.D.   On: 09/23/2015 14:18    Scheduled Meds: . alteplase  2 mg Intracatheter Once  . cefTRIAXone (ROCEPHIN)  IV  2 g Intravenous Q24H  . Chlorhexidine Gluconate Cloth  6 each Topical Q0600  . enoxaparin (LOVENOX) injection  40 mg Subcutaneous Q24H  . fluconazole  100 mg Oral Daily  . furosemide  40 mg Intravenous BID  . hydrocortisone sod succinate (SOLU-CORTEF) inj  50 mg Intravenous Q12H  . insulin aspart  0-15 Units Subcutaneous 6 times per day  . mupirocin ointment  1 application Nasal BID  . [START ON 09/25/2015] pantoprazole (PROTONIX) IV  40 mg Intravenous Q12H  . potassium chloride  40 mEq Oral Q4H  .  saccharomyces boulardii  250 mg Oral  BID  . sodium chloride flush  10-40 mL Intracatheter Q12H   Continuous Infusions: . sodium chloride 10 mL/hr (09/24/15 [id number])    Principal Problem:   Hemorrhagic shock Active Problems:   DM (diabetes mellitus) (HCC)   HTN (hypertension)   OSA (obstructive sleep apnea)   Infection of prosthetic left knee joint (HCC)   Septic shock (HCC)   Acute renal failure (ARF) (HCC)   Hypokalemia   UGIB (upper gastrointestinal bleed)   Ulcerative esophagitis    Time spent: 30 min    Maisen Schmit, [facility] Pager [phone]. If 7PM-7AM, please contact night-coverage at [email or web address], password [facility] 09/24/2015, 2:35 PM  LOS: 3 days

## 2015-09-24 NOTE — Progress Notes (Signed)
Utilization review completed.  

## 2015-09-25 DIAGNOSIS — T8454XA Infection and inflammatory reaction due to internal left knee prosthesis, initial encounter: Secondary | ICD-10-CM

## 2015-09-25 LAB — CBC
HCT: 21.7 % — ABNORMAL LOW (ref 39.0–52.0)
HEMOGLOBIN: 7 g/dL — AB (ref 13.0–17.0)
MCH: 29.9 pg (ref 26.0–34.0)
MCHC: 32.3 g/dL (ref 30.0–36.0)
MCV: 92.7 fL (ref 78.0–100.0)
Platelets: 399 10*3/uL (ref 150–400)
RBC: 2.34 MIL/uL — AB (ref 4.22–5.81)
RDW: 14.8 % (ref 11.5–15.5)
WBC: 8.7 10*3/uL (ref 4.0–10.5)

## 2015-09-25 LAB — BASIC METABOLIC PANEL
ANION GAP: 7 (ref 5–15)
BUN: 16 mg/dL (ref 6–20)
CHLORIDE: 105 mmol/L (ref 101–111)
CO2: 27 mmol/L (ref 22–32)
CREATININE: 0.51 mg/dL — AB (ref 0.61–1.24)
Calcium: 7.5 mg/dL — ABNORMAL LOW (ref 8.9–10.3)
GFR calc non Af Amer: >60 mL/min (ref >60)
GLUCOSE: 207 mg/dL — AB (ref 65–99)
Potassium: 3.7 mmol/L (ref 3.5–5.1)
Sodium: 139 mmol/L (ref 135–145)

## 2015-09-25 LAB — CATH TIP CULTURE
CULTURE: NO GROWTH
SPECIAL REQUESTS: NORMAL

## 2015-09-25 LAB — HIV ANTIBODY (ROUTINE TESTING W REFLEX): HIV Screen 4th Generation wRfx: NONREACTIVE

## 2015-09-25 LAB — PHOSPHORUS: Phosphorus: 2.6 mg/dL (ref 2.5–4.6)

## 2015-09-25 LAB — GLUCOSE, CAPILLARY
GLUCOSE-CAPILLARY: 186 mg/dL — AB (ref 65–99)
GLUCOSE-CAPILLARY: 169 mg/dL — AB (ref 65–99)
GLUCOSE-CAPILLARY: 310 mg/dL — AB (ref 65–99)
Glucose-Capillary: 306 mg/dL — ABNORMAL HIGH (ref 65–99)
Glucose-Capillary: 285 mg/dL — ABNORMAL HIGH (ref 65–99)

## 2015-09-25 LAB — MAGNESIUM: Magnesium: 1.9 mg/dL (ref 1.7–2.4)

## 2015-09-25 MED ORDER — POTASSIUM CHLORIDE CRYS ER 20 MEQ PO TBCR
40.0000 meq | EXTENDED_RELEASE_TABLET | Freq: Every day | ORAL | Status: DC
  Administered 2015-09-25 – 2015-09-26 (×2): 40 meq via ORAL
  Filled 2015-09-25 (×2): qty 2

## 2015-09-25 MED ORDER — SODIUM CHLORIDE 0.9% FLUSH
10.0000 mL | INTRAVENOUS | Status: DC | PRN
Start: 2015-09-25 — End: 2015-09-26
  Administered 2015-09-26: 10 mL
  Filled 2015-09-25: qty 40

## 2015-09-25 MED ORDER — INSULIN ASPART 100 UNIT/ML ~~LOC~~ SOLN
0.0000 [IU] | Freq: Three times a day (TID) | SUBCUTANEOUS | Status: DC
  Administered 2015-09-25: 11 [IU] via SUBCUTANEOUS
  Administered 2015-09-25 – 2015-09-26 (×2): 8 [IU] via SUBCUTANEOUS
  Administered 2015-09-26: 11 [IU] via SUBCUTANEOUS

## 2015-09-25 MED ORDER — SODIUM CHLORIDE 0.9 % IV SOLN
510.0000 mg | Freq: Once | INTRAVENOUS | Status: AC
  Administered 2015-09-25: 510 mg via INTRAVENOUS
  Filled 2015-09-25: qty 17

## 2015-09-25 MED ORDER — FERROUS SULFATE 325 (65 FE) MG PO TABS
325.0000 mg | ORAL_TABLET | Freq: Two times a day (BID) | ORAL | Status: DC
  Administered 2015-09-25 – 2015-09-26 (×3): 325 mg via ORAL
  Filled 2015-09-25 (×3): qty 1

## 2015-09-25 MED ORDER — SODIUM CHLORIDE 0.9% FLUSH: 10.0000 mL | Freq: Two times a day (BID) | INTRAVENOUS | Status: DC

## 2015-09-25 MED ORDER — PANTOPRAZOLE SODIUM 40 MG PO TBEC
40.0000 mg | DELAYED_RELEASE_TABLET | Freq: Two times a day (BID) | ORAL | Status: DC
Start: 2015-09-25 — End: 2015-09-26
  Administered 2015-09-25 – 2015-09-26 (×3): 40 mg via ORAL
  Filled 2015-09-25 (×4): qty 1

## 2015-09-25 NOTE — Progress Notes (Signed)
LIJ CVC d/c'ed per order.   No signs of infection or bleeding noted.  Site cleaned with CHG, covered with vaseline guaze and dry 4x4 occlusive dressing.  Verbalizes understanding of site care and when to call doctor via teachback method.

## 2015-09-25 NOTE — Progress Notes (Signed)
Peripherally Inserted Central Catheter/Midline Placement  The IV Nurse has discussed with the patient and/or persons authorized to consent for the patient, the purpose of this procedure and the potential benefits and risks involved with this procedure.  The benefits include less needle sticks, lab draws from the catheter and patient may be discharged home with the catheter.  Risks include, but not limited to, infection, bleeding, blood clot (thrombus formation), and puncture of an artery; nerve damage and irregular heat beat.  Alternatives to this procedure were also discussed.  PICC/Midline Placement Documentation  PICC Single Lumen 09/25/15 PICC Right Brachial 46 cm 0 cm (Active)  Indication for Insertion or Continuance of Line Home intravenous therapies (PICC only) 09/25/2015  1:17 PM  Exposed Catheter (cm) 0 cm 09/25/2015  1:17 PM  Site Assessment Clean;Dry;Intact 09/25/2015  1:17 PM  Line Status Flushed;Saline locked;Blood return noted 09/25/2015  1:17 PM  Dressing Type Transparent 09/25/2015  1:17 PM  Dressing Status Clean;Dry;Intact 09/25/2015  1:17 PM  Dressing Change Due 10/02/15 09/25/2015  1:17 PM       [facility] 09/25/2015, 1:19 PM

## 2015-09-25 NOTE — Progress Notes (Signed)
[facility] PROGRESS NOTE  Hunter Santos [id number] DOB: Nov 03, 1950 DOA: 09/21/2015 PCP: Dwan Bolt, MD  Brief Summary  65 y/o male with a history of left total knee replacement in October 2013 which became infected in 06/2013 requiring requiring spacer therapy.  He had reimplantation of the left total knee in 2015, but was hospitalized a few weeks prior to this admission with Group B strep bacteremia and infection of the prosthetic joint.  He was treated with antibiotics, surgical irrigation and debridement, and polyethylene exchange.  He was discharged with a PICC line to continue ceftriaxone and lovenox until 10/22/2015.  He developed progressive dyspnea and pallor and on the morning of admission developed jet black stools.  In the ER, his hemoglobin was 5.6 witih an elevated BUN:Cr.  He was hypotensive and tachycardic.  There was concern for sepsis and hemorrhagic shock.    SIGNIFICANT EVENTS: 3/8>>>Admit to the ICU for septic shock, started on levophed 3/9 >> levophed off.  EGD:  LA grade D esophagitis with confluent ulceration of the esophagus 3/10 >> CT ab/pelvis:  No obvious explanation for anemia  LINES/TUBES: L IJ TLC 3/8>>> PICC 3/12 >>  Assessment/Plan  Shock, presumably hemorrhagic, blood pressures low normal.  Off levophed and hemoglobin stable at 7mg /dl for last three days. No BIG in 2 days -  Continue to hold blood pressure medications  GI Bleeding likely upper given elevated BUN:Cr, however, according to the gastroenterologist, the ulcerative esophagitis and candidal esophagitis (likely due to antibiotics) may not fully explain this gentleman's anemia -  Continue PPI -  Fluconazole started on 3/10 -  CT abd/pelvis:  No obvious explanation for bleeding -  resumed lovenox on 3/10 -  Greatly appreciate GI assistance -  Advance to solid foods  Acute blood loss anemia, hgb stable from last night until this morning -  Start iron supplementation -   Feraheme infusion today  Acute on chronic diastolic heart failure due to volume resuscitation and blood transfusions.  Last ECHO 09/13/15 demonstrated EF 55-60% with grade 1 DD, suggestion of RV increased pressure or volume overload -  Continue lasix 40mg  IV BID -  Daily weights and strict I/O:  -2.4 L yesterday  Acute renal failure secondary to hemorrhagic shock, resolved.  Recent knee infection with Group B strep > knee healing -  Continue ceftriaxone which is supposed to continue until 4/8 -  PICC line today and remove central line  -  Appreciate orthopedics assistance -  Ongoing rehab with [facility]  DM history, last a1c 8.1 09/12/2015 - Continue SSI  Lethargy due to hypotension, resolved  Leukocytosis, likely secondary to acute anemia and resolved  Hypokalemia, iatrogenic and resolved with IV and oral potassium repletation  Diet:  Diabetic/healthy heart Access:  Right IJ TLC IVF:  off Proph:  SCDs  Code Status: full code Family Communication: patient alone Disposition Plan:  Possible discharge tomorrow once PICC line placed, central line removed, repeat BMP and CBC to check creatinine with diuresis and verify stable hemoglobin after resuming solid foods today.  To SNF.  SW consult placed.  May want new facility   Consultants:  PCCM  Gastroenterology, Dr. Carlean Purl  Orthopedics, Dr. Alvan Dame  CULTURES: Blood 3/8>>> NGTD Urine 3/8>>> NGTD Sputum 3/8>>> NGTD PICC tip culture >>> NGTD  ANTIBIOTICS: Vancomycin 3/8>>> 3/10 Zosyn 3/8>>> 3/10 Ceftriaxone 3/10 >>  HPI/Subjective:  Improved sob and wheeze.  Peripheral edema also improving.   Multiple stools, but no obvious blood.   Objective: Filed Vitals:  09/24/15 1831 09/24/15 2017 09/25/15 0314 09/25/15 0626  BP: 118/57 120/62 126/66 125/53  Pulse: 85 81 76 80  Temp: 97.5 F (36.4 C) 98.3 F (36.8 C) 98.2 F (36.8 C) 97.9 F (36.6 C)  TempSrc: Oral Oral Oral Oral  Resp: 18 22 16 16   Height: 6\' 1"  (1.854 m)      Weight: 120.2 kg (264 lb 15.9 oz)     SpO2: 97% 96% 97% 98%    Intake/Output Summary (Last 24 hours) at 09/25/15 1341 Last data filed at 09/25/15 1124  Gross per 24 hour  Intake    585 ml  Output   4350 ml  Net  -3765 ml   Filed Weights   09/21/15 1327 09/21/15 1741 09/24/15 1831  Weight: 122.471 kg (270 lb) 132.1 kg (291 lb 3.6 oz) 120.2 kg (264 lb 15.9 oz)   Body mass index is 34.97 kg/(m^2).  Exam:   General:  Adult male, No acute distress  HEENT:  NCAT, MMM, right IJ TLC  Cardiovascular:  RRR, nl S1, S2 no mrg, 2+ pulses, warm extremities  Respiratory:  CTAB, no increased WOB  Abdomen:   NABS, soft, NT/ND  MSK:   Normal tone and bulk, 2+ pitting bilateral LEE.   Neuro:  Grossly moves all extremities.  LLE movement limited by pain.    Data Reviewed: Basic Metabolic Panel:  Recent Labs Lab 09/21/15 1626  09/22/15 0645 09/23/15 0500 09/24/15 0630 09/24/15 1625 09/25/15 0419  NA 135  < > 138 136 138 137 139  K 5.3*  < > 3.7 3.2* 2.9* 3.0* 3.7  CL 104  < > 108 108 106 103 105  CO2 14*  < > 22 22 25 24 27   GLUCOSE 327*  < > 298* 198* 206* 293* 207*  BUN 66*  < > 61* 36* 24* 18 16  CREATININE 1.32*  < > 1.06 0.60* 0.55* 0.65 0.51*  CALCIUM 7.0*  < > 7.5* 6.9* 7.3* 7.2* 7.5*  MG 1.8  --  1.8  --  1.8  --  1.9  PHOS 6.4*  --  3.2  --   --   --  2.6  < > = values in this interval not displayed. Liver Function Tests:  Recent Labs Lab 09/21/15 1300 09/21/15 1626  AST 28 37  ALT 18 22  ALKPHOS 41 88  BILITOT 0.6 0.5  PROT 4.5* 3.9*  ALBUMIN 1.2* 1.1*   No results for input(s): LIPASE, AMYLASE in the last 168 hours. No results for input(s): AMMONIA in the last 168 hours. CBC:  Recent Labs Lab 09/21/15 1300  09/22/15 1649 09/22/15 2359 09/23/15 0500 09/23/15 1610 09/24/15 0630 09/25/15 0419  WBC 38.2*  < > 20.8*  --  17.5* 16.5* 12.6* 8.7  NEUTROABS 34.8*  --   --   --  15.6*  --   --   --   HGB 5.6*  < > 6.6* 7.0* 7.0* 7.2* 7.0* 7.0*  HCT  17.7*  < > 18.6* 20.1* 20.9* 22.1* 21.4* 21.7*  MCV 92.2  < > 84.9  --  89.3 90.2 91.8 92.7  PLT 917*  < > 516*  --  352 509* 455* 399  < > = values in this interval not displayed.  Recent Results (from the past 240 hour(s))  Culture, blood (routine x 2)     Status: None (Preliminary result)   Collection Time: 09/21/15  1:00 PM  Result Value Ref Range Status   Specimen Description BLOOD  RIGHT ARM  Final   Special Requests BOTTLES DRAWN AEROBIC AND ANAEROBIC 5CC  Final   Culture   Final    NO GROWTH 4 DAYS Performed at [facility]    Report Status PENDING  Incomplete  Urine culture     Status: None   Collection Time: 09/21/15  2:11 PM  Result Value Ref Range Status   Specimen Description URINE, CATHETERIZED  Final   Special Requests NONE  Final   Culture   Final    60,000 COLONIES/ml YEAST Performed at [facility]    Report Status 09/23/2015 FINAL  Final  Culture, blood (x 2)     Status: None (Preliminary result)   Collection Time: 09/21/15  4:26 PM  Result Value Ref Range Status   Specimen Description BLOOD  Final   Special Requests   Final    BOTTLES DRAWN AEROBIC AND ANAEROBIC 5 CC EXTERNAL JUGLAR   Culture   Final    NO GROWTH 4 DAYS Performed at [facility]    Report Status PENDING  Incomplete  Cath Tip Culture     Status: None   Collection Time: 09/21/15  7:20 PM  Result Value Ref Range Status   Specimen Description CATH TIP PICC LINE  Final   Special Requests Normal  Final   Culture   Final    NO GROWTH 3 DAYS Performed at [facility]    Report Status 09/25/2015 FINAL  Final     Studies: Ct Abdomen Pelvis W Contrast  09/23/2015  CLINICAL DATA:  "65 year old male status post revision of total knee replacement with postoperative infection, admitted from rehabilitation with hypertension and tachycardia. Noted to have hemoglobin of 5.6 down from 11.2. Concern for upper GI bleed. EXAM: CT ABDOMEN AND PELVIS WITH CONTRAST TECHNIQUE:  Multidetector CT imaging of the abdomen and pelvis was performed using the standard protocol following bolus administration of intravenous contrast. CONTRAST:  131mL OMNIPAQUE IOHEXOL 300 MG/ML  SOLN COMPARISON:  None. FINDINGS: Lower chest: Bilateral small pleural effusions with associated passive atelectasis. Hepatobiliary: No focal hepatic lesion. No biliary duct dilatation. Gallbladder is normal. Common bile duct is normal. Pancreas: Pancreas is normal. No ductal dilatation. No pancreatic inflammation. Spleen: 2 low-density subcapsular collection along the anterior margin of the spleen (image 12, series 2. Adrenals/urinary tract: Adrenal glands and kidneys are normal. The ureters and bladder normal. Stomach/Bowel: Stomach, small bowel cecum normal. Appendix not identified. The ascending transverse and descending colon are normal. There several diverticula of the descending colon. The rectosigmoid colon normal. Mild thickening of the distal rectum. Vascular/Lymphatic: Abdominal aorta is normal caliber. There is no retroperitoneal or periportal lymphadenopathy. No pelvic lymphadenopathy. Reproductive: Normal prostate Other: No retroperitoneal hematoma. No intraperitoneal free fluid. Line anasarca of the soft tissues. Musculoskeletal: No aggressive osseous lesion. IMPRESSION: 1. No evidence of source for gastrointestinal bleeding. 2. No retroperitoneal hematoma or free fluid in the abdomen or pelvis 3. Subcapsular splenic fluid collection may represent remote trauma. 4. Bilateral pleural effusions and mild anasarca. Electronically Signed   By: Suzy Bouchard M.D.   On: 09/23/2015 14:18    Scheduled Meds: . alteplase  2 mg Intracatheter Once  . cefTRIAXone (ROCEPHIN)  IV  2 g Intravenous Q24H  . Chlorhexidine Gluconate Cloth  6 each Topical Q0600  . enoxaparin (LOVENOX) injection  40 mg Subcutaneous Q24H  . fluconazole  100 mg Oral Daily  . furosemide  40 mg Intravenous BID  . insulin aspart  0-15 Units  Subcutaneous TID WC  . mupirocin ointment  1 application Nasal BID  . pantoprazole  40 mg Oral BID AC  . potassium chloride  40 mEq Oral Daily  . saccharomyces boulardii  250 mg Oral BID  . sodium chloride flush  10-40 mL Intracatheter Q12H  . sodium chloride flush  10-40 mL Intracatheter Q12H   Continuous Infusions:    Principal Problem:   Hemorrhagic shock Active Problems:   DM (diabetes mellitus) (HCC)   HTN (hypertension)   OSA (obstructive sleep apnea)   Infection of prosthetic left knee joint (HCC)   Septic shock (HCC)   Acute renal failure (ARF) (HCC)   Hypokalemia   UGIB (upper gastrointestinal bleed)   Ulcerative esophagitis   Candida esophagitis ([place])   Melena    Time spent: 30 min    Brinson Tozzi, [facility] Pager [phone]. If 7PM-7AM, please contact night-coverage at [email or web address], password [facility] 09/25/2015, 1:41 PM  LOS: 4 days

## 2015-09-26 DIAGNOSIS — B3781 Candidal esophagitis: Secondary | ICD-10-CM

## 2015-09-26 DIAGNOSIS — D62 Acute posthemorrhagic anemia: Secondary | ICD-10-CM

## 2015-09-26 HISTORY — DX: Candidal esophagitis: B37.81

## 2015-09-26 LAB — CBC
HEMATOCRIT: 22.2 % — AB (ref 39.0–52.0)
HEMOGLOBIN: 7.1 g/dL — AB (ref 13.0–17.0)
MCH: 30 pg (ref 26.0–34.0)
MCHC: 32 g/dL (ref 30.0–36.0)
MCV: 93.7 fL (ref 78.0–100.0)
Platelets: 357 10*3/uL (ref 150–400)
RBC: 2.37 MIL/uL — AB (ref 4.22–5.81)
RDW: 15.5 % (ref 11.5–15.5)
WBC: 7.6 10*3/uL (ref 4.0–10.5)

## 2015-09-26 LAB — BASIC METABOLIC PANEL
ANION GAP: 7 (ref 5–15)
BUN: 13 mg/dL (ref 6–20)
CHLORIDE: 99 mmol/L — AB (ref 101–111)
CO2: 30 mmol/L (ref 22–32)
Calcium: 7.7 mg/dL — ABNORMAL LOW (ref 8.9–10.3)
Creatinine, Ser: 0.56 mg/dL — ABNORMAL LOW (ref 0.61–1.24)
Glucose, Bld: 248 mg/dL — ABNORMAL HIGH (ref 65–99)
POTASSIUM: 3.5 mmol/L (ref 3.5–5.1)
SODIUM: 136 mmol/L (ref 135–145)

## 2015-09-26 LAB — MAGNESIUM: Magnesium: 1.6 mg/dL — ABNORMAL LOW (ref 1.7–2.4)

## 2015-09-26 LAB — CULTURE, BLOOD (ROUTINE X 2)
Culture: NO GROWTH
Culture: NO GROWTH

## 2015-09-26 LAB — GLUCOSE, CAPILLARY
GLUCOSE-CAPILLARY: 256 mg/dL — AB (ref 65–99)
GLUCOSE-CAPILLARY: 308 mg/dL — AB (ref 65–99)

## 2015-09-26 MED ORDER — FLUCONAZOLE 100 MG PO TABS: 100.0000 mg | ORAL_TABLET | Freq: Every day | ORAL | Status: DC

## 2015-09-26 MED ORDER — FUROSEMIDE 40 MG PO TABS
40.0000 mg | ORAL_TABLET | Freq: Two times a day (BID) | ORAL | Status: DC
  Administered 2015-09-26: 40 mg via ORAL
  Filled 2015-09-26: qty 1

## 2015-09-26 MED ORDER — HEPARIN SOD (PORK) LOCK FLUSH 100 UNIT/ML IV SOLN
250.0000 [IU] | INTRAVENOUS | Status: AC | PRN
  Administered 2015-09-26: 250 [IU]

## 2015-09-26 MED ORDER — PANTOPRAZOLE SODIUM 40 MG PO TBEC: 40.0000 mg | DELAYED_RELEASE_TABLET | Freq: Two times a day (BID) | ORAL | Status: DC

## 2015-09-26 MED ORDER — MAGNESIUM SULFATE 2 GM/50ML IV SOLN
2.0000 g | Freq: Once | INTRAVENOUS | Status: AC
  Administered 2015-09-26: 2 g via INTRAVENOUS
  Filled 2015-09-26: qty 50

## 2015-09-26 MED ORDER — SACCHAROMYCES BOULARDII 250 MG PO CAPS: 250.0000 mg | ORAL_CAPSULE | Freq: Two times a day (BID) | ORAL | Status: DC

## 2015-09-26 MED ORDER — FERROUS SULFATE 325 (65 FE) MG PO TABS: 325.0000 mg | ORAL_TABLET | Freq: Two times a day (BID) | ORAL | Status: DC

## 2015-09-26 NOTE — Clinical Social Work Placement (Signed)
Patient is set to discharge to [facility] today. Patient & wife, Hunter Santos at bedside are aware. Discharge packet will be given to RN, Anderson Malta. [facility] will be called for transport once admission paperwork is completed this afternoon.     Raynaldo Opitz, [facility] Clinical Social Worker cell #: [phone]    CLINICAL SOCIAL WORK PLACEMENT  NOTE  Date:  09/26/2015  Patient Details  Name: Hunter Santos MRN: [id number] Date of Birth: [birth year]  Clinical Social Work is seeking post-discharge placement for this patient at the [place] level of care (*CSW will initial, date and re-position this form in  chart as items are completed):  Yes   Patient/family provided with [place] Work Department's list of facilities offering this level of care within the geographic area requested by the patient (or if unable, by the patient's family).  Yes   Patient/family informed of their freedom to choose among providers that offer the needed level of care, that participate in Medicare, Medicaid or managed care program needed by the patient, have an available bed and are willing to accept the patient.  Yes   Patient/family informed of 's ownership interest in [facility] and [facility], as well as of the fact that they are under no obligation to receive care at these facilities.  PASRR submitted to EDS on 09/26/15     PASRR number received on 09/26/15     Existing PASRR number confirmed on       FL2 transmitted to all facilities in geographic area requested by pt/family on 09/26/15     FL2 transmitted to all facilities within larger geographic area on       Patient informed that his/her managed care company has contracts with or will negotiate with certain facilities, including the following:        Yes   Patient/family informed of bed offers received.  Patient chooses bed at [facility]      Physician recommends and patient chooses bed at      Patient to be transferred to [facility] on 09/26/15.  Patient to be transferred to facility by [facility]     Patient family notified on 09/26/15 of transfer.  Name of family member notified:  patient's wife, Hunter Santos at bedside     PHYSICIAN       Additional Comment:    _______________________________________________ Standley Brooking, LCSW 09/26/2015, 12:27 PM

## 2015-09-26 NOTE — Progress Notes (Signed)
PHARMACY CONSULT: Lovenox for VTE prophylaxis   Wt: 132kg BMI:  38 Scr:  0.56 CrCl >30 ml/hr  H/H: 7.1/22.2 (stable after transfusion) Pltc: 357  A/P:  Continue Lovenox 40mg  SQ q24h as receiving PTA  Monitor CBC, signs/symptoms of bleeding and renal function, adjust as needed   Adrian Saran, PharmD, BCPS Pager [phone] 09/26/2015 8:19 AM

## 2015-09-26 NOTE — Progress Notes (Signed)
Inpatient Diabetes Program Recommendations  AACE/ADA: New Consensus Statement on Inpatient Glycemic Control (2015)  Target Ranges:  Prepandial:   less than 140 mg/dL      Peak postprandial:   less than 180 mg/dL (1-2 hours)      Critically ill patients:  140 - 180 mg/dL   Review of Glycemic Control  Results for KINGDOM, DOBRIN (MRN [id number]) as of 09/26/2015 11:47  Ref. Range 09/25/2015 11:48 09/25/2015 16:13 09/25/2015 20:51 09/26/2015 07:27  Glucose-Capillary Latest Ref Range: 65-99 mg/dL 306 (H) 285 (H) 310 (H) 256 (H)   Steroids discontinued on 3/12. FBS > 180 mg/dL.  Inpatient Diabetes Program Recommendations:    May need small amount of basal insulin - Consider addition of Lantus 15 units QHS Add meal coverage insulin - Novolog 4 units tidwc. (Pt on 5 units tid at home.)  Will continue to follow. Thank you. Lorenda Peck, RD, LDN, CDE Inpatient Diabetes Coordinator [phone]

## 2015-09-26 NOTE — Progress Notes (Signed)
Report called to [place] accepting report for this facility. At the time of transfer Pt's assessment is without changes and VSS.

## 2015-09-26 NOTE — Progress Notes (Signed)
[facility] called for transport. Patient, wife - Hunter Santos & RN, Anderson Malta aware.    Raynaldo Opitz, [facility] Clinical Social Worker cell #: [phone]

## 2015-09-26 NOTE — Discharge Summary (Signed)
Physician Discharge Summary  Hunter Santos [id number] DOB: Mar 06, 1951 DOA: 09/21/2015  PCP: Dwan Bolt, MD  Admit date: 09/21/2015 Discharge date: 09/26/2015  Recommendations for Outpatient Follow-up:  1.  Follow up with gastroenterology within 1 month to discuss possible colonoscopy 2.  Continue fluconazole through duration of IV antibiotics and then for an additional 1 week (through April 14th)  3.  CMP and CBC in 1 week to check creatinine, hemoglobin, and liver function tests since this will be a long course of fluconazole 4.  Continue lovenox through 4/2, then stop 5.  Continue ceftiaxone through April 8th, then stop 6.  Follow up with Dr. Alvan Dame in 2 weeks 7.  Continue CPM for leg knee while in bed for 4-6 hours per day if possible 8.  Ongoing PT/OT  9.  Repeat anemia testing in 1 month to determine need for additional iron supplementation  Discharge Diagnoses:  Principal Problem:   Hemorrhagic shock Active Problems:   DM (diabetes mellitus) ([place])   HTN (hypertension)   OSA (obstructive sleep apnea)   Infection of prosthetic left knee joint (HCC)   Septic shock (HCC)   Acute renal failure (ARF) (HCC)   Hypokalemia   UGIB (upper gastrointestinal bleed)   Ulcerative esophagitis   Candida esophagitis (HCC)   Melena   Candidal esophagitis (HCC)   Acute blood loss anemia   Discharge Condition: stable, improved  Diet recommendation: diabetic diet  Wt Readings from Last 3 Encounters:  09/24/15 120.2 kg (264 lb 15.9 oz)  09/21/15 124.739 kg (275 lb)  09/19/15 118.842 kg (262 lb)    History of present illness:   65 y/o male with a history of left total knee replacement in October 2013 which became infected in 06/2013 requiring requiring spacer therapy. He had reimplantation of the left total knee in 2015, but was hospitalized a few weeks prior to this admission with Group B strep bacteremia and infection of the prosthetic joint. He was treated with  antibiotics, surgical irrigation and debridement, and polyethylene exchange. He was discharged with a PICC line to continue ceftriaxone and lovenox until 10/22/2015. He developed progressive dyspnea and pallor and on the morning of admission developed jet black stools. In the ER, his hemoglobin was 5.6 witih an elevated BUN:Cr. He was hypotensive and tachycardic. There was concern for sepsis and hemorrhagic shock.   SIGNIFICANT EVENTS: 3/8>>>Admit to the ICU for septic shock, started on levophed 3/9 >> levophed off. EGD: LA grade D esophagitis with confluent ulceration of the esophagus 3/10 >> CT ab/pelvis: No obvious explanation for anemia, started fluconazole  3/11 >> resumed lovenox, started diuresis 3/12 >> advanced to soft diet  LINES/TUBES: L IJ TLC 3/8>>> PICC 3/12 >>  Hospital Course:   Hemorrhagic shock secondary to GIB.  He was admitted to the ICU and required central line placement, IVF, blood transfusions and was briefly on levophed to support blood pressures.  He required a total of 4 units of blood for acute blood loss anemia and his hemoglobin has remained near 7 mg/dl.  He was seen by gastroenterology who felt that his bleeding was likely upper GIB because of his elevated BUN to creatinine ratio.  He underwent EGD on 3/9 which demonstrated LA grade D esophagitis with confluent ulceration of the esophagus.  Pathology confirmed candidal esophagitis.  He was placed on PPI and fluconazole was started after pathology confirmed diagnosis.  Gastroenterology felt that his ulcerations may not be the full explanation for his anemia and ordered a CT of  the abdomen and pelvis which demonstrated no obvious mass or other finding to explain anemia.  He will need a colonoscopy once he is more recovered from this illness.  He likely developed thrush because of a long course of IV antibiotics he was receiving for septic arthritis of the left knee and from ulcerations from GERD while essentially  bedbound. He was given one dose of feraheme and should continue oral iron supplementation.    Acute on chronic diastolic heart failure due to volume resuscitation and blood transfusions. Had wheezing and 2+ pitting edema of legs which have been resolving with diuresis.  Last ECHO 09/13/15 demonstrated EF 55-60% with grade 1 DD, suggestion of RV increased pressure or volume overload.  He was started on lasix 40mg  IV BID and diuresed briskly, 2-4.6L per day for several days.  His CO2 started to rise slightly so his diuresis was converted to oral medication, however, his BUN and creatinine continued to trend down.    Acute renal failure secondary to hemorrhagic shock, resolved.  Creatinine 0.56 at discharge.  Septic knee arthritis/infected hardware of the left knee with Group B strep.  Orthopedic surgery evaluated his knee during hospitalization.  When he presented with hypotension, he was treated for possible septic shock because he had a profound leukocytosis.  He remained afebrile.  He had no obvious pneumonia or UTI and most likely his leukocytosis was secondary to acute blood loss anemia.  His antibiotics were changed back to ceftriaxone to continue as previously prescribed through 10/22/2015.    DM history, last a1c 8.1 09/12/2015, resumed home medications but with sliding scale with meals again   Essential hypertension, blood pressure medications held for hypotension.  Have not resumed blood pressure medications yet since blood pressures have been stable.  Was previously on ramipril 10mg  daily and norvasc 10mg .  Lethargy due to hypotension, resolved  Leukocytosis, likely secondary to acute anemia and resolved  Hypokalemia, iatrogenic and resolved with IV and oral potassium repletation  Consultants:  PCCM  Gastroenterology, Dr. Carlean Purl  Orthopedics, Dr. Alvan Dame  CULTURES: Blood 3/8>>> NGTD Urine 3/8>>> NGTD Sputum 3/8>>> NGTD PICC tip culture >>> NGTD  ANTIBIOTICS: Vancomycin 3/8>>>  3/10 Zosyn 3/8>>> 3/10 Ceftriaxone 3/10 >> Fluconazole 3/10 >>  Discharge Exam: Filed Vitals:   09/25/15 2107 09/26/15 0440  BP: 136/66 130/66  Pulse: 87 87  Temp: 97.6 F (36.4 C) 98.3 F (36.8 C)  Resp: 18 18   Filed Vitals:   09/25/15 0626 09/25/15 1410 09/25/15 2107 09/26/15 0440  BP: 125/53 121/66 136/66 130/66  Pulse: 80 97 87 87  Temp: 97.9 F (36.6 C) 97.8 F (36.6 C) 97.6 F (36.4 C) 98.3 F (36.8 C)  TempSrc: Oral Oral Oral Oral  Resp: 16 18 18 18   Height:      Weight:      SpO2: 98% 97% 98% 92%     General: Adult male, No acute distress  HEENT: NCAT, MMM  Cardiovascular: RRR, nl S1, S2 no mrg, 2+ pulses, warm extremities  Respiratory: CTAB, no increased WOB  Abdomen: NABS, soft, NT/ND  MSK: Normal tone and bulk, 1+ pitting bilateral LEE.   Neuro: Grossly moves all extremities. LLE movement limited by pain. Right arm PICC line  Discharge Instructions      Discharge Instructions    (HEART FAILURE PATIENTS) Call MD:  Anytime you have any of the following symptoms: 1) 3 pound weight gain in 24 hours or 5 pounds in 1 week 2) shortness of breath, with or without  a dry hacking cough 3) swelling in the hands, feet or stomach 4) if you have to sleep on extra pillows at night in order to breathe.    Complete by:  As directed      Call MD for:  difficulty breathing, headache or visual disturbances    Complete by:  As directed      Call MD for:  extreme fatigue    Complete by:  As directed      Call MD for:  hives    Complete by:  As directed      Call MD for:  persistant dizziness or light-headedness    Complete by:  As directed      Call MD for:  persistant nausea and vomiting    Complete by:  As directed      Call MD for:  redness, tenderness, or signs of infection (pain, swelling, redness, odor or green/yellow discharge around incision site)    Complete by:  As directed      Call MD for:  severe uncontrolled pain    Complete by:  As  directed      Call MD for:  temperature >100.4    Complete by:  As directed      Diet - low sodium heart healthy    Complete by:  As directed      Diet Carb Modified    Complete by:  As directed      Increase activity slowly    Complete by:  As directed      Leave dressing on - Keep it clean, dry, and intact until clinic visit    Complete by:  As directed             Medication List    STOP taking these medications        amLODipine 10 MG tablet  Commonly known as:  NORVASC     aspirin 81 MG tablet     methocarbamol 500 MG tablet  Commonly known as:  ROBAXIN     ondansetron 4 MG tablet  Commonly known as:  ZOFRAN     oxyCODONE 5 MG immediate release tablet  Commonly known as:  Oxy IR/ROXICODONE     ramipril 10 MG tablet  Commonly known as:  ALTACE      TAKE these medications        acetaminophen 325 MG tablet  Commonly known as:  TYLENOL  Take 2 tablets (650 mg total) by mouth every 6 (six) hours as needed for mild pain (or Fever >/= 101).     BLUE-EMU MAXIMUM STRENGTH EX  Apply 1 application topically 2 (two) times daily as needed (muscle strain). Patient uses on shoulders     cefTRIAXone 2 g in dextrose 5 % 50 mL  Inject 2 g into the vein daily. Continue until 10/22/2015     enoxaparin 40 MG/0.4ML injection  Commonly known as:  LOVENOX  Inject 0.4 mLs (40 mg total) into the skin daily. To continue for 30 days for DVT prophylaxis then stop.     ferrous sulfate 325 (65 FE) MG tablet  Take 1 tablet (325 mg total) by mouth 2 (two) times daily with a meal.     fluconazole 100 MG tablet  Commonly known as:  DIFLUCAN  Take 1 tablet (100 mg total) by mouth daily.     furosemide 40 MG tablet  Commonly known as:  LASIX  Take 40 mg by mouth daily before breakfast.  glimepiride 4 MG tablet  Commonly known as:  AMARYL  Take 4 mg by mouth daily before breakfast.     insulin aspart 100 UNIT/ML injection  Commonly known as:  novoLOG  Inject 0-9 Units into  the skin 3 (three) times daily with meals.     metFORMIN 500 MG tablet  Commonly known as:  GLUCOPHAGE  Take 500-1,000 mg by mouth 2 (two) times daily with a meal. Take 1000mg  in the morning, and then 500mg  at night     pantoprazole 40 MG tablet  Commonly known as:  PROTONIX  Take 1 tablet (40 mg total) by mouth 2 (two) times daily before a meal.     phosphorus 155-852-130 MG tablet  Commonly known as:  K PHOS NEUTRAL  Take 1 tablet (250 mg total) by mouth daily.     pravastatin 40 MG tablet  Commonly known as:  PRAVACHOL  Take 40 mg by mouth every evening.     saccharomyces boulardii 250 MG capsule  Commonly known as:  FLORASTOR  Take 1 capsule (250 mg total) by mouth 2 (two) times daily.       Follow-up Information    Follow up with Dwan Bolt, MD. Schedule an appointment as soon as possible for a visit in 2 weeks.   Specialty:  Endocrinology   Contact information:   [address] [place] [address] [phone]       Follow up with Mauri Pole, MD. Schedule an appointment as soon as possible for a visit in 2 weeks.   Specialty:  Orthopedic Surgery   Contact information:   [address] [place] [address] [place] [address] [phone]       Follow up with Silvano Rusk, MD. Schedule an appointment as soon as possible for a visit in 1 month.   Specialty:  Gastroenterology   Contact information:   [address] [address] [phone]        The results of significant diagnostics from this hospitalization (including imaging, microbiology, ancillary and laboratory) are listed below for reference.    Significant Diagnostic Studies: Ct Abdomen Pelvis W Contrast  09/23/2015  CLINICAL DATA:  "65 year old male status post revision of total knee replacement with postoperative infection, admitted from rehabilitation with hypertension and tachycardia. Noted to have hemoglobin of 5.6 down from 11.2. Concern for upper GI  bleed. EXAM: CT ABDOMEN AND PELVIS WITH CONTRAST TECHNIQUE: Multidetector CT imaging of the abdomen and pelvis was performed using the standard protocol following bolus administration of intravenous contrast. CONTRAST:  153mL OMNIPAQUE IOHEXOL 300 MG/ML  SOLN COMPARISON:  None. FINDINGS: Lower chest: Bilateral small pleural effusions with associated passive atelectasis. Hepatobiliary: No focal hepatic lesion. No biliary duct dilatation. Gallbladder is normal. Common bile duct is normal. Pancreas: Pancreas is normal. No ductal dilatation. No pancreatic inflammation. Spleen: 2 low-density subcapsular collection along the anterior margin of the spleen (image 12, series 2. Adrenals/urinary tract: Adrenal glands and kidneys are normal. The ureters and bladder normal. Stomach/Bowel: Stomach, small bowel cecum normal. Appendix not identified. The ascending transverse and descending colon are normal. There several diverticula of the descending colon. The rectosigmoid colon normal. Mild thickening of the distal rectum. Vascular/Lymphatic: Abdominal aorta is normal caliber. There is no retroperitoneal or periportal lymphadenopathy. No pelvic lymphadenopathy. Reproductive: Normal prostate Other: No retroperitoneal hematoma. No intraperitoneal free fluid. Line anasarca of the soft tissues. Musculoskeletal: No aggressive osseous lesion. IMPRESSION: 1. No evidence of source for gastrointestinal bleeding. 2. No retroperitoneal hematoma  or free fluid in the abdomen or pelvis 3. Subcapsular splenic fluid collection may represent remote trauma. 4. Bilateral pleural effusions and mild anasarca. Electronically Signed   By: Suzy Bouchard M.D.   On: 09/23/2015 14:18   Dg Chest Port 1 View  09/22/2015  CLINICAL DATA:  Shortness of breath. EXAM: PORTABLE CHEST 1 VIEW COMPARISON:  09/21/2015. FINDINGS: Left IJ line in stable position. Mediastinum hilar structures normal. Cardiomegaly pulmonary vascular prominence. Mild bilateral  interstitial prominence and small left pleural effusion. Findings consistent mild congestive heart failure. Low lung volumes with mild bibasilar atelectasis. No definite pneumothorax. Tiny density noted over the left apex most likely skin fold, attention this region on subsequent exams suggested . IMPRESSION: 1. Left IJ line in stable position. Tiny linear density noted over the left pulmonary apex appears to represent a skin fold, attention to this region on subsequent exams suggested. No definite pneumothorax. 2. Cardiomegaly with pulmonary vascular prominence and interstitial prominence consistent with mild congestive heart failure. Small left pleural effusion. Electronically Signed   By: Marcello Moores  Register   On: 09/22/2015 07:23   Dg Chest Port 1 View  09/21/2015  CLINICAL DATA:  Central line placement EXAM: PORTABLE CHEST 1 VIEW COMPARISON:  09/21/2015 FINDINGS: Stable right PICC line. New left internal jugular central line with tip projecting over the superior vena cava. No pneumothorax. Low lung volumes with cardiac enlargement similar to prior study. IMPRESSION: Left IJ central line as described Electronically Signed   By: Skipper Cliche M.D.   On: 09/21/2015 15:58   Dg Chest Portable 1 View  09/21/2015  CLINICAL DATA:  Fever EXAM: PORTABLE CHEST 1 VIEW COMPARISON:  09/11/2015 FINDINGS: Cardiac shadow is stable. The lungs are well aerated bilaterally. No focal infiltrate or sizable effusion is seen. Interval placement of right PICC line is noted in satisfactory position. IMPRESSION: No acute abnormality noted. Electronically Signed   By: Inez Catalina M.D.   On: 09/21/2015 13:44   Dg Chest Port 1 View  09/11/2015  CLINICAL DATA:  Acute fever and shortness of breath. EXAM: PORTABLE CHEST 1 VIEW COMPARISON:  10/20/2010 chest radiograph FINDINGS: The cardiomediastinal silhouette is unremarkable. There is no evidence of focal airspace disease, pulmonary edema, suspicious pulmonary nodule/mass, pleural  effusion, or pneumothorax. No acute bony abnormalities are identified. IMPRESSION: No active disease. Electronically Signed   By: Margarette Canada M.D.   On: 09/11/2015 14:02   Dg Shoulder Left  09/14/2015  CLINICAL DATA:  Left shoulder pain for lower for 1 week. No known injury. Initial encounter. EXAM: LEFT SHOULDER - 2+ VIEW COMPARISON:  None. FINDINGS: No acute bony or joint abnormality is identified. The humeral head is high-riding suggestive of chronic rotator cuff tear. There is glenohumeral and acromioclavicular osteoarthritis. A calcification is seen projecting just lateral to the acromion and is likely in the subacromial/subdeltoid bursa. Imaged left lung and ribs appear normal. IMPRESSION: No acute abnormality. Acromioclavicular and glenohumeral osteoarthritis. Findings suggestive of chronic rotator cuff tear. Finding suggestive of calcific subacromial/subdeltoid bursitis. Electronically Signed   By: Inge Rise M.D.   On: 09/14/2015 18:50    Microbiology: Recent Results (from the past 240 hour(s))  Culture, blood (routine x 2)     Status: None   Collection Time: 09/21/15  1:00 PM  Result Value Ref Range Status   Specimen Description BLOOD RIGHT ARM  Final   Special Requests BOTTLES DRAWN AEROBIC AND ANAEROBIC 5CC  Final   Culture   Final    NO GROWTH 5  DAYS Performed at [facility]    Report Status 09/26/2015 FINAL  Final  Urine culture     Status: None   Collection Time: 09/21/15  2:11 PM  Result Value Ref Range Status   Specimen Description URINE, CATHETERIZED  Final   Special Requests NONE  Final   Culture   Final    60,000 COLONIES/ml YEAST Performed at [facility]    Report Status 09/23/2015 FINAL  Final  Culture, blood (x 2)     Status: None   Collection Time: 09/21/15  4:26 PM  Result Value Ref Range Status   Specimen Description BLOOD  Final   Special Requests   Final    BOTTLES DRAWN AEROBIC AND ANAEROBIC 5 CC EXTERNAL JUGLAR   Culture   Final     NO GROWTH 5 DAYS Performed at [facility]    Report Status 09/26/2015 FINAL  Final  Cath Tip Culture     Status: None   Collection Time: 09/21/15  7:20 PM  Result Value Ref Range Status   Specimen Description CATH TIP PICC LINE  Final   Special Requests Normal  Final   Culture   Final    NO GROWTH 3 DAYS Performed at [facility]    Report Status 09/25/2015 FINAL  Final     Labs: Basic Metabolic Panel:  Recent Labs Lab 09/21/15 1626  09/22/15 0645 09/23/15 0500 09/24/15 0630 09/24/15 1625 09/25/15 0419 09/26/15 0400  NA 135  < > 138 136 138 137 139 136  K 5.3*  < > 3.7 3.2* 2.9* 3.0* 3.7 3.5  CL 104  < > 108 108 106 103 105 99*  CO2 14*  < > 22 22 25 24 27 30   GLUCOSE 327*  < > 298* 198* 206* 293* 207* 248*  BUN 66*  < > 61* 36* 24* 18 16 13   CREATININE 1.32*  < > 1.06 0.60* 0.55* 0.65 0.51* 0.56*  CALCIUM 7.0*  < > 7.5* 6.9* 7.3* 7.2* 7.5* 7.7*  MG 1.8  --  1.8  --  1.8  --  1.9 1.6*  PHOS 6.4*  --  3.2  --   --   --  2.6  --   < > = values in this interval not displayed. Liver Function Tests:  Recent Labs Lab 09/21/15 1300 09/21/15 1626  AST 28 37  ALT 18 22  ALKPHOS 41 88  BILITOT 0.6 0.5  PROT 4.5* 3.9*  ALBUMIN 1.2* 1.1*   No results for input(s): LIPASE, AMYLASE in the last 168 hours. No results for input(s): AMMONIA in the last 168 hours. CBC:  Recent Labs Lab 09/21/15 1300  09/23/15 0500 09/23/15 1610 09/24/15 0630 09/25/15 0419 09/26/15 0400  WBC 38.2*  < > 17.5* 16.5* 12.6* 8.7 7.6  NEUTROABS 34.8*  --  15.6*  --   --   --   --   HGB 5.6*  < > 7.0* 7.2* 7.0* 7.0* 7.1*  HCT 17.7*  < > 20.9* 22.1* 21.4* 21.7* 22.2*  MCV 92.2  < > 89.3 90.2 91.8 92.7 93.7  PLT 917*  < > 352 509* 455* 399 357  < > = values in this interval not displayed. Cardiac Enzymes:  Recent Labs Lab 09/21/15 1626 09/21/15 2230 09/22/15 0645  TROPONINI <0.03 <0.03 <0.03   BNP: BNP (last 3 results) No results for input(s): BNP in the last 8760  hours.  ProBNP (last 3 results) No results for input(s): PROBNP  in the last 8760 hours.  CBG:  Recent Labs Lab 09/25/15 0729 09/25/15 1148 09/25/15 1613 09/25/15 2051 09/26/15 0727  GLUCAP 169* 306* 285* 310* 256*    Time coordinating discharge: 35 minutes  Signed:  Candus Braud  [facility] 09/26/2015, 12:15 PM

## 2015-09-27 ENCOUNTER — Telehealth: Payer: Self-pay | Admitting: Internal Medicine

## 2015-09-27 NOTE — Telephone Encounter (Signed)
Dr. Jerilee Field is calling to schedule a follow up .  I have reviewed your last note from the hospital and youi indicated he needed follow up if his symptoms worsened or improved substantially where he could have a colonoscopy.  They are just calling for routine post hospital.  Please advise

## 2015-09-27 NOTE — Telephone Encounter (Signed)
Patient is scheduled for 11/21/15 2:30

## 2015-09-27 NOTE — Telephone Encounter (Signed)
It is reasonable to see him next available and sooner prn. He has multiple issues that will get in way of a colonoscopy for next couple of months

## 2015-09-28 ENCOUNTER — Encounter (HOSPITAL_COMMUNITY): Payer: Self-pay | Admitting: Internal Medicine

## 2015-10-17 ENCOUNTER — Ambulatory Visit (INDEPENDENT_AMBULATORY_CARE_PROVIDER_SITE_OTHER): Payer: 59 | Admitting: Internal Medicine

## 2015-10-17 DIAGNOSIS — T8454XA Infection and inflammatory reaction due to internal left knee prosthesis, initial encounter: Secondary | ICD-10-CM

## 2015-10-17 LAB — CBC
HEMATOCRIT: 27.5 % — AB (ref 38.5–50.0)
Hemoglobin: 8.6 g/dL — ABNORMAL LOW (ref 13.2–17.1)
MCH: 26.8 pg — AB (ref 27.0–33.0)
MCHC: 31.3 g/dL — AB (ref 32.0–36.0)
MCV: 85.7 fL (ref 80.0–100.0)
MPV: 8.5 fL (ref 7.5–12.5)
Platelets: 471 10*3/uL — ABNORMAL HIGH (ref 140–400)
RBC: 3.21 MIL/uL — ABNORMAL LOW (ref 4.20–5.80)
RDW: 16.4 % — ABNORMAL HIGH (ref 11.0–15.0)
WBC: 7.4 10*3/uL (ref 3.8–10.8)

## 2015-10-17 LAB — COMPREHENSIVE METABOLIC PANEL
ALBUMIN: 2.4 g/dL — AB (ref 3.6–5.1)
ALT: 9 U/L (ref 9–46)
AST: 8 U/L — ABNORMAL LOW (ref 10–35)
Alkaline Phosphatase: 66 U/L (ref 40–115)
BUN: 10 mg/dL (ref 7–25)
CALCIUM: 7.6 mg/dL — AB (ref 8.6–10.3)
CHLORIDE: 102 mmol/L (ref 98–110)
CO2: 24 mmol/L (ref 20–31)
Creat: 0.55 mg/dL — ABNORMAL LOW (ref 0.70–1.25)
Glucose, Bld: 103 mg/dL — ABNORMAL HIGH (ref 65–99)
POTASSIUM: 3.5 mmol/L (ref 3.5–5.3)
Sodium: 138 mmol/L (ref 135–146)
Total Bilirubin: 0.3 mg/dL (ref 0.2–1.2)
Total Protein: 5.9 g/dL — ABNORMAL LOW (ref 6.1–8.1)

## 2015-10-17 LAB — C-REACTIVE PROTEIN: CRP: 10.5 mg/dL — ABNORMAL HIGH (ref <0.60)

## 2015-10-17 LAB — SEDIMENTATION RATE: SED RATE: 66 mm/h — AB (ref 0–20)

## 2015-10-17 MED ORDER — DEXTROSE 5 % IV SOLN: 2.0000 g | INTRAVENOUS | Status: AC

## 2015-10-17 MED ORDER — AMOXICILLIN 500 MG PO CAPS: 500.0000 mg | ORAL_CAPSULE | Freq: Three times a day (TID) | ORAL | Status: DC

## 2015-10-17 NOTE — Assessment & Plan Note (Addendum)
He seems to be improving slowly on therapy for group B streptococcal infection of his left prosthetic knee. I will repeat his inflammatory markers today. He will complete IV ceftriaxone on 10/21/2015 and then switched to oral amoxicillin. I'm planning on a total of 6 months of antibiotic therapy. He can have his PICC removed after he completes ceftriaxone on 10/21/2015. I'm not entirely certain why he is having shoulder pain but I see no evidence that he has had septic arthritis. He will follow-up with me in 6 weeks.

## 2015-10-17 NOTE — Progress Notes (Signed)
[facility]ious [facility]  Patient Active Problem List   Diagnosis Date Noted  . Streptococcal bacteremia 09/12/2015    Priority: High  . Septic shock ([place]) 09/11/2015    Priority: High  . Severe sepsis ([place]) 09/11/2015    Priority: High  . Infection of prosthetic left knee joint ([place]) 06/16/2013    Priority: High  . S/P revision of total knee 08/17/2013    Priority: Medium  . S/P left knee resection, abx spacer 06/15/2013    Priority: Medium  . Candidal esophagitis ([place]) 09/26/2015  . Acute blood loss anemia 09/26/2015  . Candida esophagitis ([place])   . Melena   . UGIB (upper gastrointestinal bleed)   . Ulcerative esophagitis   . Hemorrhagic shock   . Shoulder pain, left 09/20/2015  . Hyperlipidemia 09/20/2015  . High anion gap metabolic acidosis XX123456  . Acute renal failure (ARF) ([place]) 09/11/2015  . Hypokalemia 09/11/2015  . Lactic acid acidosis 09/11/2015  . DM (diabetes mellitus) ([place]) 06/16/2013  . HTN (hypertension) 06/16/2013  . OSA (obstructive sleep apnea) 06/16/2013  . Postoperative anemia due to acute blood loss 04/30/2012  . Obese 04/30/2012  . Hyponatremia 04/30/2012    Patient's Medications  New Prescriptions   AMOXICILLIN (AMOXIL) 500 MG CAPSULE    Take 1 capsule (500 mg total) by mouth 3 (three) times daily.  Previous Medications   ACETAMINOPHEN (TYLENOL) 325 MG TABLET    Take 2 tablets (650 mg total) by mouth every 6 (six) hours as needed for mild pain (or Fever >/= 101).   ENOXAPARIN (LOVENOX) 40 MG/0.4ML INJECTION    Inject 0.4 mLs (40 mg total) into the skin daily. To continue for 30 days for DVT prophylaxis then stop.   FERROUS SULFATE 325 (65 FE) MG TABLET    Take 1 tablet (325 mg total) by mouth 2 (two) times daily with a meal.   FLUCONAZOLE (DIFLUCAN) 100 MG TABLET    Take 1 tablet (100 mg total) by mouth daily.   FUROSEMIDE (LASIX) 40 MG TABLET    Take 40 mg by mouth daily before breakfast.   GLIMEPIRIDE (AMARYL) 4  MG TABLET    Take 4 mg by mouth daily before breakfast.   INSULIN ASPART (NOVOLOG) 100 UNIT/ML INJECTION    Inject 0-9 Units into the skin 3 (three) times daily with meals.   MENTHOL, TOPICAL ANALGESIC, (BLUE-EMU MAXIMUM STRENGTH EX)    Apply 1 application topically 2 (two) times daily as needed (muscle strain). Patient uses on shoulders   METFORMIN (GLUCOPHAGE) 500 MG TABLET    Take 500-1,000 mg by mouth 2 (two) times daily with a meal. Take 1000mg  in the morning, and then 500mg  at night   PANTOPRAZOLE (PROTONIX) 40 MG TABLET    Take 1 tablet (40 mg total) by mouth 2 (two) times daily before a meal.   PHOSPHORUS (K PHOS NEUTRAL) 155-852-130 MG TABLET    Take 1 tablet (250 mg total) by mouth daily.   PRAVASTATIN (PRAVACHOL) 40 MG TABLET    Take 40 mg by mouth every evening.   SACCHAROMYCES BOULARDII (FLORASTOR) 250 MG CAPSULE    Take 1 capsule (250 mg total) by mouth 2 (two) times daily.  Modified Medications   Modified Medication Previous Medication   CEFTRIAXONE 2 G IN DEXTROSE 5 % 50 ML cefTRIAXone 2 g in dextrose 5 % 50 mL      Inject 2 g into the vein daily. Continue until 10/22/2015  Inject 2 g into the vein daily. Continue until 10/22/2015  Discontinued Medications   No medications on file    Subjective: [facility] Santos is in for his hospital follow-up visit. He is accompanied by his wife. He is a 65 year old with DJD and a prior left total knee arthroplasty. In 2014 he developed coagulase-negative staph infection of his prosthetic knee and underwent a two-stage reconstruction and was cured. In late February he fell and scraped his left knee. He then began having pain and swelling. He had an outpatient aspiration of his knee that grew beta-hemolytic strep. He was admitted to the hospital and underwent incision and drainage with poly-exchange. Operative cultures grew group B streptococcus. He was discharged on IV ceftriaxone. He is now completed 38 days of total therapy. He's had no problems  tolerating his PICC or ceftriaxone. He was readmitted to the hospital earlier this month with a GI bleed. He is now back in a skilled nursing facility undergoing physical therapy. He states that he is making slow progress. He walks with a walker and states that his left knee pain has improved to 4 out of 10. However, he is having shoulder pain, right greater than left, ever since he injured his knee. He has not noticed any swelling, redness or warmth of the shoulders.   Review of Systems: Review of Systems  Constitutional: Positive for malaise/fatigue. Negative for fever, chills, weight loss and diaphoresis.  HENT: Negative for sore throat.   Respiratory: Negative for cough, sputum production and shortness of breath.   Cardiovascular: Negative for chest pain.  Gastrointestinal: Negative for nausea, vomiting, abdominal pain and diarrhea.  Genitourinary: Negative for dysuria.  Musculoskeletal: Positive for joint pain. Negative for myalgias.  Skin: Negative for rash.  Neurological: Negative for dizziness, focal weakness and headaches.    Past Medical History  Diagnosis Date  . Hypertension   . Diabetes mellitus   . Arthritis   . History of transfusion   . Sleep apnea     had surgery for deviated septum previously    Social History  Substance Use Topics  . Smoking status: Never Smoker   . Smokeless tobacco: Never Used  . Alcohol Use: No    Family History  Problem Relation Age of Onset  . Cancer Father     Lung    No Known Allergies  Objective: Filed Vitals:   10/17/15 1612  BP: 114/76  Pulse: 102  Temp: 97.8 F (36.6 C)  TempSrc: Oral   There is no weight on file to calculate BMI.  Physical Exam  Constitutional: He is oriented to person, place, and time.  He is pleasant and in no distress. He is seated in a wheelchair.  Eyes: Conjunctivae are normal.  Cardiovascular: Normal rate and regular rhythm.   No murmur heard. Pulmonary/Chest: Breath sounds normal.    Abdominal: Soft. There is no tenderness.  Musculoskeletal: Normal range of motion.  He has a clean dry dressing over his left knee. There is no warmth or redness. He has good range of motion.  There is no swelling, warmth or redness of the shoulders. He has difficulty with abduction bilaterally because of pain and weakness.  Neurological: He is alert and oriented to person, place, and time.  Skin: No rash noted.  Right arm PICC site looks good.  Psychiatric: Mood and affect normal.    Lab Results    Problem List Items Addressed This Visit      High   Infection of prosthetic left  knee joint ([place])    He seems to be improving slowly on therapy for group B streptococcal infection of his left prosthetic knee. I will repeat his inflammatory markers today. He will complete IV ceftriaxone on 10/21/2015 and then switched to oral amoxicillin. I'm planning on a total of 6 months of antibiotic therapy. He can have his PICC removed after he completes ceftriaxone on 10/21/2015. I'm not entirely certain why he is having shoulder pain but I see no evidence that he has had septic arthritis. He will follow-up with me in 6 weeks.      Relevant Medications   amoxicillin (AMOXIL) 500 MG capsule   cefTRIAXone 2 g in dextrose 5 % 50 mL   Other Relevant Orders   CBC   Comprehensive metabolic panel   C-reactive protein   Sedimentation rate       [name], MD [facility] for Infectious [place] [phone] pager   [phone] cell 10/17/2015, 4:53 PM

## 2015-10-24 ENCOUNTER — Telehealth: Payer: Self-pay | Admitting: *Deleted

## 2015-10-24 NOTE — Telephone Encounter (Signed)
Patient notified [facility] Santos  

## 2015-10-24 NOTE — Telephone Encounter (Signed)
Please let him know that his inflammatory markers are still elevated but are improving.

## 2015-10-24 NOTE — Telephone Encounter (Signed)
Patient called for his lab results. Wanted to be sure that his infection is improving, now that his picc line has been removed. He understands that he will be on the oral antibiotic for awhile. He thinks six months and this looks correct per Dr. Hale Bogus last office note. Advised him the inflammatory markers show improvement and I will call him back once Dr. Megan Salon reviews his labs. [facility]

## 2015-11-14 DIAGNOSIS — M6281 Muscle weakness (generalized): Secondary | ICD-10-CM | POA: Diagnosis not present

## 2015-11-14 DIAGNOSIS — M1990 Unspecified osteoarthritis, unspecified site: Secondary | ICD-10-CM | POA: Diagnosis not present

## 2015-11-14 DIAGNOSIS — T8454XA Infection and inflammatory reaction due to internal left knee prosthesis, initial encounter: Secondary | ICD-10-CM | POA: Diagnosis not present

## 2015-11-14 DIAGNOSIS — I110 Hypertensive heart disease with heart failure: Secondary | ICD-10-CM | POA: Diagnosis not present

## 2015-11-14 DIAGNOSIS — E119 Type 2 diabetes mellitus without complications: Secondary | ICD-10-CM | POA: Diagnosis not present

## 2015-11-14 DIAGNOSIS — B951 Streptococcus, group B, as the cause of diseases classified elsewhere: Secondary | ICD-10-CM | POA: Diagnosis not present

## 2015-11-15 DIAGNOSIS — M6281 Muscle weakness (generalized): Secondary | ICD-10-CM | POA: Diagnosis not present

## 2015-11-15 DIAGNOSIS — I110 Hypertensive heart disease with heart failure: Secondary | ICD-10-CM | POA: Diagnosis not present

## 2015-11-15 DIAGNOSIS — B951 Streptococcus, group B, as the cause of diseases classified elsewhere: Secondary | ICD-10-CM | POA: Diagnosis not present

## 2015-11-15 DIAGNOSIS — E119 Type 2 diabetes mellitus without complications: Secondary | ICD-10-CM | POA: Diagnosis not present

## 2015-11-15 DIAGNOSIS — M1990 Unspecified osteoarthritis, unspecified site: Secondary | ICD-10-CM | POA: Diagnosis not present

## 2015-11-15 DIAGNOSIS — T8454XA Infection and inflammatory reaction due to internal left knee prosthesis, initial encounter: Secondary | ICD-10-CM | POA: Diagnosis not present

## 2015-11-16 DIAGNOSIS — I110 Hypertensive heart disease with heart failure: Secondary | ICD-10-CM | POA: Diagnosis not present

## 2015-11-16 DIAGNOSIS — M6281 Muscle weakness (generalized): Secondary | ICD-10-CM | POA: Diagnosis not present

## 2015-11-16 DIAGNOSIS — E119 Type 2 diabetes mellitus without complications: Secondary | ICD-10-CM | POA: Diagnosis not present

## 2015-11-16 DIAGNOSIS — T8454XA Infection and inflammatory reaction due to internal left knee prosthesis, initial encounter: Secondary | ICD-10-CM | POA: Diagnosis not present

## 2015-11-16 DIAGNOSIS — B951 Streptococcus, group B, as the cause of diseases classified elsewhere: Secondary | ICD-10-CM | POA: Diagnosis not present

## 2015-11-16 DIAGNOSIS — M1990 Unspecified osteoarthritis, unspecified site: Secondary | ICD-10-CM | POA: Diagnosis not present

## 2015-11-18 DIAGNOSIS — B951 Streptococcus, group B, as the cause of diseases classified elsewhere: Secondary | ICD-10-CM | POA: Diagnosis not present

## 2015-11-18 DIAGNOSIS — T8454XA Infection and inflammatory reaction due to internal left knee prosthesis, initial encounter: Secondary | ICD-10-CM | POA: Diagnosis not present

## 2015-11-18 DIAGNOSIS — E119 Type 2 diabetes mellitus without complications: Secondary | ICD-10-CM | POA: Diagnosis not present

## 2015-11-18 DIAGNOSIS — M6281 Muscle weakness (generalized): Secondary | ICD-10-CM | POA: Diagnosis not present

## 2015-11-18 DIAGNOSIS — I110 Hypertensive heart disease with heart failure: Secondary | ICD-10-CM | POA: Diagnosis not present

## 2015-11-18 DIAGNOSIS — M1990 Unspecified osteoarthritis, unspecified site: Secondary | ICD-10-CM | POA: Diagnosis not present

## 2015-11-21 ENCOUNTER — Ambulatory Visit: Payer: 59 | Admitting: Internal Medicine

## 2015-11-21 DIAGNOSIS — I110 Hypertensive heart disease with heart failure: Secondary | ICD-10-CM | POA: Diagnosis not present

## 2015-11-21 DIAGNOSIS — M1990 Unspecified osteoarthritis, unspecified site: Secondary | ICD-10-CM | POA: Diagnosis not present

## 2015-11-21 DIAGNOSIS — T8454XA Infection and inflammatory reaction due to internal left knee prosthesis, initial encounter: Secondary | ICD-10-CM | POA: Diagnosis not present

## 2015-11-21 DIAGNOSIS — B951 Streptococcus, group B, as the cause of diseases classified elsewhere: Secondary | ICD-10-CM | POA: Diagnosis not present

## 2015-11-21 DIAGNOSIS — M6281 Muscle weakness (generalized): Secondary | ICD-10-CM | POA: Diagnosis not present

## 2015-11-21 DIAGNOSIS — E119 Type 2 diabetes mellitus without complications: Secondary | ICD-10-CM | POA: Diagnosis not present

## 2015-11-22 ENCOUNTER — Other Ambulatory Visit: Payer: Self-pay | Admitting: *Deleted

## 2015-11-22 DIAGNOSIS — E119 Type 2 diabetes mellitus without complications: Secondary | ICD-10-CM | POA: Diagnosis not present

## 2015-11-22 DIAGNOSIS — M1990 Unspecified osteoarthritis, unspecified site: Secondary | ICD-10-CM | POA: Diagnosis not present

## 2015-11-22 DIAGNOSIS — I110 Hypertensive heart disease with heart failure: Secondary | ICD-10-CM | POA: Diagnosis not present

## 2015-11-22 DIAGNOSIS — B951 Streptococcus, group B, as the cause of diseases classified elsewhere: Secondary | ICD-10-CM | POA: Diagnosis not present

## 2015-11-22 DIAGNOSIS — T8454XA Infection and inflammatory reaction due to internal left knee prosthesis, initial encounter: Secondary | ICD-10-CM | POA: Diagnosis not present

## 2015-11-22 DIAGNOSIS — M6281 Muscle weakness (generalized): Secondary | ICD-10-CM | POA: Diagnosis not present

## 2015-11-22 MED ORDER — AMOXICILLIN 500 MG PO CAPS: 500.0000 mg | ORAL_CAPSULE | Freq: Three times a day (TID) | ORAL | Status: DC

## 2015-11-23 DIAGNOSIS — M1990 Unspecified osteoarthritis, unspecified site: Secondary | ICD-10-CM | POA: Diagnosis not present

## 2015-11-23 DIAGNOSIS — B951 Streptococcus, group B, as the cause of diseases classified elsewhere: Secondary | ICD-10-CM | POA: Diagnosis not present

## 2015-11-23 DIAGNOSIS — T8454XA Infection and inflammatory reaction due to internal left knee prosthesis, initial encounter: Secondary | ICD-10-CM | POA: Diagnosis not present

## 2015-11-23 DIAGNOSIS — I110 Hypertensive heart disease with heart failure: Secondary | ICD-10-CM | POA: Diagnosis not present

## 2015-11-23 DIAGNOSIS — M6281 Muscle weakness (generalized): Secondary | ICD-10-CM | POA: Diagnosis not present

## 2015-11-23 DIAGNOSIS — E119 Type 2 diabetes mellitus without complications: Secondary | ICD-10-CM | POA: Diagnosis not present

## 2015-11-24 DIAGNOSIS — E119 Type 2 diabetes mellitus without complications: Secondary | ICD-10-CM | POA: Diagnosis not present

## 2015-11-24 DIAGNOSIS — M1990 Unspecified osteoarthritis, unspecified site: Secondary | ICD-10-CM | POA: Diagnosis not present

## 2015-11-24 DIAGNOSIS — I110 Hypertensive heart disease with heart failure: Secondary | ICD-10-CM | POA: Diagnosis not present

## 2015-11-24 DIAGNOSIS — T8454XA Infection and inflammatory reaction due to internal left knee prosthesis, initial encounter: Secondary | ICD-10-CM | POA: Diagnosis not present

## 2015-11-24 DIAGNOSIS — B951 Streptococcus, group B, as the cause of diseases classified elsewhere: Secondary | ICD-10-CM | POA: Diagnosis not present

## 2015-11-24 DIAGNOSIS — M6281 Muscle weakness (generalized): Secondary | ICD-10-CM | POA: Diagnosis not present

## 2015-11-25 DIAGNOSIS — T8454XA Infection and inflammatory reaction due to internal left knee prosthesis, initial encounter: Secondary | ICD-10-CM | POA: Diagnosis not present

## 2015-11-25 DIAGNOSIS — B951 Streptococcus, group B, as the cause of diseases classified elsewhere: Secondary | ICD-10-CM | POA: Diagnosis not present

## 2015-11-25 DIAGNOSIS — M6281 Muscle weakness (generalized): Secondary | ICD-10-CM | POA: Diagnosis not present

## 2015-11-25 DIAGNOSIS — E119 Type 2 diabetes mellitus without complications: Secondary | ICD-10-CM | POA: Diagnosis not present

## 2015-11-25 DIAGNOSIS — I110 Hypertensive heart disease with heart failure: Secondary | ICD-10-CM | POA: Diagnosis not present

## 2015-11-25 DIAGNOSIS — M1990 Unspecified osteoarthritis, unspecified site: Secondary | ICD-10-CM | POA: Diagnosis not present

## 2015-11-27 DIAGNOSIS — M6281 Muscle weakness (generalized): Secondary | ICD-10-CM | POA: Diagnosis not present

## 2015-11-27 DIAGNOSIS — I110 Hypertensive heart disease with heart failure: Secondary | ICD-10-CM | POA: Diagnosis not present

## 2015-11-27 DIAGNOSIS — B951 Streptococcus, group B, as the cause of diseases classified elsewhere: Secondary | ICD-10-CM | POA: Diagnosis not present

## 2015-11-27 DIAGNOSIS — T8454XA Infection and inflammatory reaction due to internal left knee prosthesis, initial encounter: Secondary | ICD-10-CM | POA: Diagnosis not present

## 2015-11-27 DIAGNOSIS — E119 Type 2 diabetes mellitus without complications: Secondary | ICD-10-CM | POA: Diagnosis not present

## 2015-11-27 DIAGNOSIS — M1990 Unspecified osteoarthritis, unspecified site: Secondary | ICD-10-CM | POA: Diagnosis not present

## 2015-11-29 DIAGNOSIS — M6281 Muscle weakness (generalized): Secondary | ICD-10-CM | POA: Diagnosis not present

## 2015-11-29 DIAGNOSIS — I110 Hypertensive heart disease with heart failure: Secondary | ICD-10-CM | POA: Diagnosis not present

## 2015-11-29 DIAGNOSIS — E119 Type 2 diabetes mellitus without complications: Secondary | ICD-10-CM | POA: Diagnosis not present

## 2015-11-29 DIAGNOSIS — M1990 Unspecified osteoarthritis, unspecified site: Secondary | ICD-10-CM | POA: Diagnosis not present

## 2015-11-29 DIAGNOSIS — T8454XA Infection and inflammatory reaction due to internal left knee prosthesis, initial encounter: Secondary | ICD-10-CM | POA: Diagnosis not present

## 2015-11-29 DIAGNOSIS — B951 Streptococcus, group B, as the cause of diseases classified elsewhere: Secondary | ICD-10-CM | POA: Diagnosis not present

## 2015-12-01 ENCOUNTER — Encounter: Payer: Self-pay | Admitting: Internal Medicine

## 2015-12-01 ENCOUNTER — Ambulatory Visit (INDEPENDENT_AMBULATORY_CARE_PROVIDER_SITE_OTHER): Payer: Medicare Other | Admitting: Internal Medicine

## 2015-12-01 DIAGNOSIS — T8454XA Infection and inflammatory reaction due to internal left knee prosthesis, initial encounter: Secondary | ICD-10-CM

## 2015-12-01 DIAGNOSIS — Z471 Aftercare following joint replacement surgery: Secondary | ICD-10-CM | POA: Diagnosis not present

## 2015-12-01 DIAGNOSIS — Z96652 Presence of left artificial knee joint: Secondary | ICD-10-CM | POA: Diagnosis not present

## 2015-12-01 LAB — C-REACTIVE PROTEIN: CRP: 1.8 mg/dL — AB (ref <0.60)

## 2015-12-01 NOTE — Assessment & Plan Note (Signed)
He is improving slowly on therapy for his prosthetic knee infection. He will continue amoxicillin for at least 3 more months. I will repeat his inflammatory markers today.

## 2015-12-01 NOTE — Progress Notes (Signed)
[facility] for Infectious Disease  Patient Active Problem List   Diagnosis Date Noted  . Streptococcal bacteremia 09/12/2015    Priority: High  . Septic shock ([place]) 09/11/2015    Priority: High  . Severe sepsis ([place]) 09/11/2015    Priority: High  . Infection of prosthetic left knee joint ([place]) 06/16/2013    Priority: High  . S/P revision of total knee 08/17/2013    Priority: Medium  . S/P left knee resection, abx spacer 06/15/2013    Priority: Medium  . Candidal esophagitis ([place]) 09/26/2015  . Acute blood loss anemia 09/26/2015  . Candida esophagitis ([place])   . Melena   . UGIB (upper gastrointestinal bleed)   . Ulcerative esophagitis   . Hemorrhagic shock   . Shoulder pain, left 09/20/2015  . Hyperlipidemia 09/20/2015  . High anion gap metabolic acidosis XX123456  . Acute renal failure (ARF) ([place]) 09/11/2015  . Hypokalemia 09/11/2015  . Lactic acid acidosis 09/11/2015  . DM (diabetes mellitus) ([place]) 06/16/2013  . HTN (hypertension) 06/16/2013  . OSA (obstructive sleep apnea) 06/16/2013  . Postoperative anemia due to acute blood loss 04/30/2012  . Obese 04/30/2012  . Hyponatremia 04/30/2012    Patient's Medications  New Prescriptions   No medications on file  Previous Medications   ACETAMINOPHEN (TYLENOL) 325 MG TABLET    Take 2 tablets (650 mg total) by mouth every 6 (six) hours as needed for mild pain (or Fever >/= 101).   AMOXICILLIN (AMOXIL) 500 MG CAPSULE    Take 1 capsule (500 mg total) by mouth 3 (three) times daily.   ENOXAPARIN (LOVENOX) 40 MG/0.4ML INJECTION    Inject 0.4 mLs (40 mg total) into the skin daily. To continue for 30 days for DVT prophylaxis then stop.   FERROUS SULFATE 325 (65 FE) MG TABLET    Take 1 tablet (325 mg total) by mouth 2 (two) times daily with a meal.   FUROSEMIDE (LASIX) 40 MG TABLET    Take 40 mg by mouth daily before breakfast.   GLIMEPIRIDE (AMARYL) 4 MG TABLET    Take 4 mg by mouth daily before breakfast.   INSULIN ASPART (NOVOLOG) 100 UNIT/ML INJECTION    Inject 0-9 Units into the skin 3 (three) times daily with meals.   MENTHOL, TOPICAL ANALGESIC, (BLUE-EMU MAXIMUM STRENGTH EX)    Apply 1 application topically 2 (two) times daily as needed (muscle strain). Patient uses on shoulders   METFORMIN (GLUCOPHAGE) 500 MG TABLET    Take 500-1,000 mg by mouth 2 (two) times daily with a meal. Take 1000mg  in the morning, and then 500mg  at night   PANTOPRAZOLE (PROTONIX) 40 MG TABLET    Take 1 tablet (40 mg total) by mouth 2 (two) times daily before a meal.   PHOSPHORUS (K PHOS NEUTRAL) 155-852-130 MG TABLET    Take 1 tablet (250 mg total) by mouth daily.   PRAVASTATIN (PRAVACHOL) 40 MG TABLET    Take 40 mg by mouth every evening.   SACCHAROMYCES BOULARDII (FLORASTOR) 250 MG CAPSULE    Take 1 capsule (250 mg total) by mouth 2 (two) times daily.  Modified Medications   No medications on file  Discontinued Medications   FLUCONAZOLE (DIFLUCAN) 100 MG TABLET    Take 1 tablet (100 mg total) by mouth daily.    Subjective: Hunter Santos is in for his routine follow-up visit. He is now completed 3 months of total antibiotic therapy for his group B streptococcal left  prosthetic knee infection. He is having any problems tolerating his amoxicillin. He is having only minimal left knee pain and will occasionally take ibuprofen. He continues outpatient physical therapy.  Review of Systems: Review of Systems  Constitutional: Positive for weight loss. Negative for fever, chills, malaise/fatigue and diaphoresis.  HENT: Negative for sore throat.   Respiratory: Negative for cough.   Cardiovascular: Negative for chest pain.  Gastrointestinal: Negative for nausea, vomiting, abdominal pain and diarrhea.  Musculoskeletal: Positive for joint pain.  Skin: Negative for rash.  Neurological: Negative for headaches.    Past Medical History  Diagnosis Date  . Hypertension   . Diabetes mellitus   . Arthritis   . History of transfusion     . Sleep apnea     had surgery for deviated septum previously    Social History  Substance Use Topics  . Smoking status: Never Smoker   . Smokeless tobacco: Never Used  . Alcohol Use: No    Family History  Problem Relation Age of Onset  . Cancer Father     Lung    No Known Allergies  Objective: Filed Vitals:   12/01/15 1000  BP: 117/78  Pulse: 108  Temp: 97.8 F (36.6 C)  TempSrc: Oral  Height: 6\' 1"  (1.854 m)  Weight: 240 lb (108.863 kg)   Body mass index is 31.67 kg/(m^2).  Physical Exam  Constitutional: He is oriented to person, place, and time.  He is joking and in very good spirits today. He is accompanied by his wife.  Musculoskeletal:  His left knee is slightly swollen and warm but this is unchanged from previous exams. His incision is fully healed. He walks with the aid of a cane.  Neurological: He is alert and oriented to person, place, and time.  Skin: No rash noted.  Psychiatric: Mood and affect normal.    Lab Results SED RATE (mm/hr)  Date Value  10/17/2015 66*  09/14/2015 76*  06/18/2013 36*   CRP (mg/dL)  Date Value  10/17/2015 10.5*  09/14/2015 14.6*  06/18/2013 4.4*     Problem List Items Addressed This Visit      High   Infection of prosthetic left knee joint ([place])    He is improving slowly on therapy for his prosthetic knee infection. He will continue amoxicillin for at least 3 more months. I will repeat his inflammatory markers today.      Relevant Orders   C-reactive protein   Sedimentation rate       [name], MD [facility] for Infectious [place] [phone] pager   [phone] cell 12/01/2015, 10:46 AM

## 2015-12-02 DIAGNOSIS — E119 Type 2 diabetes mellitus without complications: Secondary | ICD-10-CM | POA: Diagnosis not present

## 2015-12-02 DIAGNOSIS — B951 Streptococcus, group B, as the cause of diseases classified elsewhere: Secondary | ICD-10-CM | POA: Diagnosis not present

## 2015-12-02 DIAGNOSIS — I110 Hypertensive heart disease with heart failure: Secondary | ICD-10-CM | POA: Diagnosis not present

## 2015-12-02 DIAGNOSIS — M6281 Muscle weakness (generalized): Secondary | ICD-10-CM | POA: Diagnosis not present

## 2015-12-02 DIAGNOSIS — M1990 Unspecified osteoarthritis, unspecified site: Secondary | ICD-10-CM | POA: Diagnosis not present

## 2015-12-02 DIAGNOSIS — T8454XA Infection and inflammatory reaction due to internal left knee prosthesis, initial encounter: Secondary | ICD-10-CM | POA: Diagnosis not present

## 2015-12-02 LAB — SEDIMENTATION RATE: Sed Rate: 8 mm/hr (ref 0–20)

## 2015-12-08 DIAGNOSIS — M19012 Primary osteoarthritis, left shoulder: Secondary | ICD-10-CM | POA: Diagnosis not present

## 2015-12-08 DIAGNOSIS — M6281 Muscle weakness (generalized): Secondary | ICD-10-CM | POA: Diagnosis not present

## 2015-12-08 DIAGNOSIS — M25562 Pain in left knee: Secondary | ICD-10-CM | POA: Diagnosis not present

## 2015-12-08 DIAGNOSIS — M19011 Primary osteoarthritis, right shoulder: Secondary | ICD-10-CM | POA: Diagnosis not present

## 2015-12-13 DIAGNOSIS — M6281 Muscle weakness (generalized): Secondary | ICD-10-CM | POA: Diagnosis not present

## 2015-12-13 DIAGNOSIS — M25562 Pain in left knee: Secondary | ICD-10-CM | POA: Diagnosis not present

## 2015-12-13 DIAGNOSIS — M19012 Primary osteoarthritis, left shoulder: Secondary | ICD-10-CM | POA: Diagnosis not present

## 2015-12-13 DIAGNOSIS — M19011 Primary osteoarthritis, right shoulder: Secondary | ICD-10-CM | POA: Diagnosis not present

## 2015-12-14 DIAGNOSIS — M25562 Pain in left knee: Secondary | ICD-10-CM | POA: Diagnosis not present

## 2015-12-14 DIAGNOSIS — M19011 Primary osteoarthritis, right shoulder: Secondary | ICD-10-CM | POA: Diagnosis not present

## 2015-12-14 DIAGNOSIS — M6281 Muscle weakness (generalized): Secondary | ICD-10-CM | POA: Diagnosis not present

## 2015-12-14 DIAGNOSIS — M19012 Primary osteoarthritis, left shoulder: Secondary | ICD-10-CM | POA: Diagnosis not present

## 2015-12-16 DIAGNOSIS — M25562 Pain in left knee: Secondary | ICD-10-CM | POA: Diagnosis not present

## 2015-12-16 DIAGNOSIS — M19012 Primary osteoarthritis, left shoulder: Secondary | ICD-10-CM | POA: Diagnosis not present

## 2015-12-16 DIAGNOSIS — M19011 Primary osteoarthritis, right shoulder: Secondary | ICD-10-CM | POA: Diagnosis not present

## 2015-12-16 DIAGNOSIS — M6281 Muscle weakness (generalized): Secondary | ICD-10-CM | POA: Diagnosis not present

## 2015-12-19 DIAGNOSIS — M25562 Pain in left knee: Secondary | ICD-10-CM | POA: Diagnosis not present

## 2015-12-19 DIAGNOSIS — M19011 Primary osteoarthritis, right shoulder: Secondary | ICD-10-CM | POA: Diagnosis not present

## 2015-12-19 DIAGNOSIS — M6281 Muscle weakness (generalized): Secondary | ICD-10-CM | POA: Diagnosis not present

## 2015-12-19 DIAGNOSIS — M19012 Primary osteoarthritis, left shoulder: Secondary | ICD-10-CM | POA: Diagnosis not present

## 2015-12-21 DIAGNOSIS — M25562 Pain in left knee: Secondary | ICD-10-CM | POA: Diagnosis not present

## 2015-12-21 DIAGNOSIS — M6281 Muscle weakness (generalized): Secondary | ICD-10-CM | POA: Diagnosis not present

## 2015-12-21 DIAGNOSIS — M19011 Primary osteoarthritis, right shoulder: Secondary | ICD-10-CM | POA: Diagnosis not present

## 2015-12-21 DIAGNOSIS — M19012 Primary osteoarthritis, left shoulder: Secondary | ICD-10-CM | POA: Diagnosis not present

## 2015-12-23 DIAGNOSIS — M19012 Primary osteoarthritis, left shoulder: Secondary | ICD-10-CM | POA: Diagnosis not present

## 2015-12-23 DIAGNOSIS — M19011 Primary osteoarthritis, right shoulder: Secondary | ICD-10-CM | POA: Diagnosis not present

## 2015-12-23 DIAGNOSIS — M6281 Muscle weakness (generalized): Secondary | ICD-10-CM | POA: Diagnosis not present

## 2015-12-23 DIAGNOSIS — M25562 Pain in left knee: Secondary | ICD-10-CM | POA: Diagnosis not present

## 2015-12-26 DIAGNOSIS — M19011 Primary osteoarthritis, right shoulder: Secondary | ICD-10-CM | POA: Diagnosis not present

## 2015-12-26 DIAGNOSIS — M25562 Pain in left knee: Secondary | ICD-10-CM | POA: Diagnosis not present

## 2015-12-26 DIAGNOSIS — M19012 Primary osteoarthritis, left shoulder: Secondary | ICD-10-CM | POA: Diagnosis not present

## 2015-12-26 DIAGNOSIS — M6281 Muscle weakness (generalized): Secondary | ICD-10-CM | POA: Diagnosis not present

## 2015-12-28 DIAGNOSIS — M19011 Primary osteoarthritis, right shoulder: Secondary | ICD-10-CM | POA: Diagnosis not present

## 2015-12-28 DIAGNOSIS — M25562 Pain in left knee: Secondary | ICD-10-CM | POA: Diagnosis not present

## 2015-12-28 DIAGNOSIS — M6281 Muscle weakness (generalized): Secondary | ICD-10-CM | POA: Diagnosis not present

## 2015-12-28 DIAGNOSIS — M19012 Primary osteoarthritis, left shoulder: Secondary | ICD-10-CM | POA: Diagnosis not present

## 2015-12-29 DIAGNOSIS — M25562 Pain in left knee: Secondary | ICD-10-CM | POA: Diagnosis not present

## 2015-12-29 DIAGNOSIS — M19011 Primary osteoarthritis, right shoulder: Secondary | ICD-10-CM | POA: Diagnosis not present

## 2015-12-29 DIAGNOSIS — M6281 Muscle weakness (generalized): Secondary | ICD-10-CM | POA: Diagnosis not present

## 2015-12-29 DIAGNOSIS — M19012 Primary osteoarthritis, left shoulder: Secondary | ICD-10-CM | POA: Diagnosis not present

## 2016-01-05 DIAGNOSIS — M19011 Primary osteoarthritis, right shoulder: Secondary | ICD-10-CM | POA: Diagnosis not present

## 2016-01-05 DIAGNOSIS — M25562 Pain in left knee: Secondary | ICD-10-CM | POA: Diagnosis not present

## 2016-01-05 DIAGNOSIS — M6281 Muscle weakness (generalized): Secondary | ICD-10-CM | POA: Diagnosis not present

## 2016-01-05 DIAGNOSIS — M19012 Primary osteoarthritis, left shoulder: Secondary | ICD-10-CM | POA: Diagnosis not present

## 2016-01-09 DIAGNOSIS — M6281 Muscle weakness (generalized): Secondary | ICD-10-CM | POA: Diagnosis not present

## 2016-01-09 DIAGNOSIS — M25562 Pain in left knee: Secondary | ICD-10-CM | POA: Diagnosis not present

## 2016-01-09 DIAGNOSIS — M19012 Primary osteoarthritis, left shoulder: Secondary | ICD-10-CM | POA: Diagnosis not present

## 2016-01-09 DIAGNOSIS — M19011 Primary osteoarthritis, right shoulder: Secondary | ICD-10-CM | POA: Diagnosis not present

## 2016-01-11 DIAGNOSIS — M25562 Pain in left knee: Secondary | ICD-10-CM | POA: Diagnosis not present

## 2016-01-11 DIAGNOSIS — M19012 Primary osteoarthritis, left shoulder: Secondary | ICD-10-CM | POA: Diagnosis not present

## 2016-01-11 DIAGNOSIS — M6281 Muscle weakness (generalized): Secondary | ICD-10-CM | POA: Diagnosis not present

## 2016-01-11 DIAGNOSIS — M19011 Primary osteoarthritis, right shoulder: Secondary | ICD-10-CM | POA: Diagnosis not present

## 2016-01-18 DIAGNOSIS — Z471 Aftercare following joint replacement surgery: Secondary | ICD-10-CM | POA: Diagnosis not present

## 2016-01-18 DIAGNOSIS — Z96652 Presence of left artificial knee joint: Secondary | ICD-10-CM | POA: Diagnosis not present

## 2016-01-24 DIAGNOSIS — M19011 Primary osteoarthritis, right shoulder: Secondary | ICD-10-CM | POA: Diagnosis not present

## 2016-01-24 DIAGNOSIS — M19012 Primary osteoarthritis, left shoulder: Secondary | ICD-10-CM | POA: Diagnosis not present

## 2016-01-24 DIAGNOSIS — M25562 Pain in left knee: Secondary | ICD-10-CM | POA: Diagnosis not present

## 2016-01-24 DIAGNOSIS — M6281 Muscle weakness (generalized): Secondary | ICD-10-CM | POA: Diagnosis not present

## 2016-01-26 DIAGNOSIS — M6281 Muscle weakness (generalized): Secondary | ICD-10-CM | POA: Diagnosis not present

## 2016-01-26 DIAGNOSIS — M19012 Primary osteoarthritis, left shoulder: Secondary | ICD-10-CM | POA: Diagnosis not present

## 2016-01-26 DIAGNOSIS — M19011 Primary osteoarthritis, right shoulder: Secondary | ICD-10-CM | POA: Diagnosis not present

## 2016-01-26 DIAGNOSIS — M25562 Pain in left knee: Secondary | ICD-10-CM | POA: Diagnosis not present

## 2016-01-31 DIAGNOSIS — M19011 Primary osteoarthritis, right shoulder: Secondary | ICD-10-CM | POA: Diagnosis not present

## 2016-01-31 DIAGNOSIS — M6281 Muscle weakness (generalized): Secondary | ICD-10-CM | POA: Diagnosis not present

## 2016-01-31 DIAGNOSIS — M25562 Pain in left knee: Secondary | ICD-10-CM | POA: Diagnosis not present

## 2016-01-31 DIAGNOSIS — M19012 Primary osteoarthritis, left shoulder: Secondary | ICD-10-CM | POA: Diagnosis not present

## 2016-02-02 DIAGNOSIS — M6281 Muscle weakness (generalized): Secondary | ICD-10-CM | POA: Diagnosis not present

## 2016-02-02 DIAGNOSIS — M25562 Pain in left knee: Secondary | ICD-10-CM | POA: Diagnosis not present

## 2016-02-02 DIAGNOSIS — M19012 Primary osteoarthritis, left shoulder: Secondary | ICD-10-CM | POA: Diagnosis not present

## 2016-02-02 DIAGNOSIS — M19011 Primary osteoarthritis, right shoulder: Secondary | ICD-10-CM | POA: Diagnosis not present

## 2016-02-07 DIAGNOSIS — M19012 Primary osteoarthritis, left shoulder: Secondary | ICD-10-CM | POA: Diagnosis not present

## 2016-02-07 DIAGNOSIS — E789 Disorder of lipoprotein metabolism, unspecified: Secondary | ICD-10-CM | POA: Diagnosis not present

## 2016-02-07 DIAGNOSIS — E118 Type 2 diabetes mellitus with unspecified complications: Secondary | ICD-10-CM | POA: Diagnosis not present

## 2016-02-07 DIAGNOSIS — M25562 Pain in left knee: Secondary | ICD-10-CM | POA: Diagnosis not present

## 2016-02-07 DIAGNOSIS — I10 Essential (primary) hypertension: Secondary | ICD-10-CM | POA: Diagnosis not present

## 2016-02-07 DIAGNOSIS — M19011 Primary osteoarthritis, right shoulder: Secondary | ICD-10-CM | POA: Diagnosis not present

## 2016-02-07 DIAGNOSIS — M6281 Muscle weakness (generalized): Secondary | ICD-10-CM | POA: Diagnosis not present

## 2016-02-09 DIAGNOSIS — M25562 Pain in left knee: Secondary | ICD-10-CM | POA: Diagnosis not present

## 2016-02-09 DIAGNOSIS — I10 Essential (primary) hypertension: Secondary | ICD-10-CM | POA: Diagnosis not present

## 2016-02-09 DIAGNOSIS — E789 Disorder of lipoprotein metabolism, unspecified: Secondary | ICD-10-CM | POA: Diagnosis not present

## 2016-02-09 DIAGNOSIS — E118 Type 2 diabetes mellitus with unspecified complications: Secondary | ICD-10-CM | POA: Diagnosis not present

## 2016-02-09 DIAGNOSIS — M6281 Muscle weakness (generalized): Secondary | ICD-10-CM | POA: Diagnosis not present

## 2016-02-09 DIAGNOSIS — M19012 Primary osteoarthritis, left shoulder: Secondary | ICD-10-CM | POA: Diagnosis not present

## 2016-02-09 DIAGNOSIS — M19011 Primary osteoarthritis, right shoulder: Secondary | ICD-10-CM | POA: Diagnosis not present

## 2016-02-14 DIAGNOSIS — M6281 Muscle weakness (generalized): Secondary | ICD-10-CM | POA: Diagnosis not present

## 2016-02-14 DIAGNOSIS — M19012 Primary osteoarthritis, left shoulder: Secondary | ICD-10-CM | POA: Diagnosis not present

## 2016-02-14 DIAGNOSIS — M25562 Pain in left knee: Secondary | ICD-10-CM | POA: Diagnosis not present

## 2016-02-14 DIAGNOSIS — M19011 Primary osteoarthritis, right shoulder: Secondary | ICD-10-CM | POA: Diagnosis not present

## 2016-02-16 DIAGNOSIS — M19012 Primary osteoarthritis, left shoulder: Secondary | ICD-10-CM | POA: Diagnosis not present

## 2016-02-16 DIAGNOSIS — M19011 Primary osteoarthritis, right shoulder: Secondary | ICD-10-CM | POA: Diagnosis not present

## 2016-02-16 DIAGNOSIS — M6281 Muscle weakness (generalized): Secondary | ICD-10-CM | POA: Diagnosis not present

## 2016-02-16 DIAGNOSIS — M25562 Pain in left knee: Secondary | ICD-10-CM | POA: Diagnosis not present

## 2016-02-23 DIAGNOSIS — M19011 Primary osteoarthritis, right shoulder: Secondary | ICD-10-CM | POA: Diagnosis not present

## 2016-02-23 DIAGNOSIS — M25562 Pain in left knee: Secondary | ICD-10-CM | POA: Diagnosis not present

## 2016-02-23 DIAGNOSIS — M6281 Muscle weakness (generalized): Secondary | ICD-10-CM | POA: Diagnosis not present

## 2016-02-23 DIAGNOSIS — M19012 Primary osteoarthritis, left shoulder: Secondary | ICD-10-CM | POA: Diagnosis not present

## 2016-02-26 ENCOUNTER — Emergency Department (HOSPITAL_COMMUNITY)
Admission: EM | Admit: 2016-02-26 | Discharge: 2016-02-26 | Disposition: A | Discharge status: Home / Self Care | Payer: Medicare Other | Attending: Emergency Medicine | Admitting: Emergency Medicine

## 2016-02-26 ENCOUNTER — Encounter (HOSPITAL_COMMUNITY): Payer: Self-pay | Admitting: Emergency Medicine

## 2016-02-26 DIAGNOSIS — Z7984 Long term (current) use of oral hypoglycemic drugs: Secondary | ICD-10-CM | POA: Insufficient documentation

## 2016-02-26 DIAGNOSIS — Z79899 Other long term (current) drug therapy: Secondary | ICD-10-CM | POA: Insufficient documentation

## 2016-02-26 DIAGNOSIS — S61001A Unspecified open wound of right thumb without damage to nail, initial encounter: Secondary | ICD-10-CM | POA: Insufficient documentation

## 2016-02-26 DIAGNOSIS — Z23 Encounter for immunization: Secondary | ICD-10-CM | POA: Diagnosis not present

## 2016-02-26 DIAGNOSIS — W268XXA Contact with other sharp object(s), not elsewhere classified, initial encounter: Secondary | ICD-10-CM | POA: Insufficient documentation

## 2016-02-26 DIAGNOSIS — Z7982 Long term (current) use of aspirin: Secondary | ICD-10-CM | POA: Diagnosis not present

## 2016-02-26 DIAGNOSIS — Y999 Unspecified external cause status: Secondary | ICD-10-CM | POA: Insufficient documentation

## 2016-02-26 DIAGNOSIS — Y9389 Activity, other specified: Secondary | ICD-10-CM | POA: Insufficient documentation

## 2016-02-26 DIAGNOSIS — Y929 Unspecified place or not applicable: Secondary | ICD-10-CM | POA: Insufficient documentation

## 2016-02-26 DIAGNOSIS — S61011A Laceration without foreign body of right thumb without damage to nail, initial encounter: Secondary | ICD-10-CM | POA: Diagnosis not present

## 2016-02-26 DIAGNOSIS — E119 Type 2 diabetes mellitus without complications: Secondary | ICD-10-CM | POA: Diagnosis not present

## 2016-02-26 DIAGNOSIS — I10 Essential (primary) hypertension: Secondary | ICD-10-CM | POA: Insufficient documentation

## 2016-02-26 MED ORDER — LIDOCAINE HCL 2 % IJ SOLN
INTRAMUSCULAR | Status: AC
  Administered 2016-02-26: 40 mg
  Filled 2016-02-26: qty 20

## 2016-02-26 MED ORDER — CEPHALEXIN 500 MG PO CAPS: 500.0000 mg | ORAL_CAPSULE | Freq: Two times a day (BID) | ORAL | 0 refills | Status: DC

## 2016-02-26 MED ORDER — LIDOCAINE HCL (PF) 1 % IJ SOLN
5.0000 mL | Freq: Once | INTRAMUSCULAR | Status: DC
  Filled 2016-02-26: qty 5

## 2016-02-26 MED ORDER — SILVER NITRATE-POT NITRATE 75-25 % EX MISC
1.0000 | Freq: Once | CUTANEOUS | Status: AC
  Administered 2016-02-26: 1 via TOPICAL
  Filled 2016-02-26: qty 1

## 2016-02-26 MED ORDER — TETANUS-DIPHTH-ACELL PERTUSSIS 5-2.5-18.5 LF-MCG/0.5 IM SUSP
0.5000 mL | Freq: Once | INTRAMUSCULAR | Status: AC
  Administered 2016-02-26: 0.5 mL via INTRAMUSCULAR
  Filled 2016-02-26: qty 0.5

## 2016-02-26 NOTE — ED Triage Notes (Signed)
Patient reports slicing his thumb on a mandolin this morning at 8am. Patient states he applied a bandage and bleeding was controlled. When he changed the bandage the thumb started bleeding again. Patient reports he takes 81mg  ASA daily. Patient went to an Urgent Care and was told to come to the ER because "he might have nerve damage".

## 2016-02-26 NOTE — ED Provider Notes (Signed)
[place] DEPT Provider Note   CSN: [id number] Arrival date & time: 02/26/16  1457  First Provider Contact:   First MD Initiated Contact with Patient 02/26/16 1543       By signing my name below, I, Rayna Sexton, attest that this documentation has been prepared under the direction and in the presence of Shary Decamp, PA-C. Electronically Signed: Rayna Sexton, ED Scribe. 02/26/16. 3:55 PM.   History   Chief Complaint Chief Complaint  Patient presents with  . Laceration    right thumb    HPI HPI Comments: Hunter Santos is a 65 y.o. male who presents to the Emergency Department complaining of a mild laceration with intermittent bleeding to his right distal thumb which occurred this morning at 8:00 am. Pt states he was cutting vegetables this morning and accidentally cut the affected region. He states he initially wrapped the wound which relieved its bleeding and it then began once again and he went to an UC who wrapped his thumb and told him to follow up with the ED for further evaluation. He reports associated 2/10 pain across the wound. Pt takes baby aspirin but denies being on any other blood thinning medications. He denies his tetanus vaccination is UTD. Pt reports a PMHx of NIDDM. He is currently taking amoxacillin. Pt denies any weakness, numbness or other associated symptoms at this time.   The history is provided by the patient and the spouse. No language interpreter was used.    Past Medical History:  Diagnosis Date  . Arthritis   . Diabetes mellitus   . History of transfusion   . Hypertension   . Sleep apnea    had surgery for deviated septum previously    Patient Active Problem List   Diagnosis Date Noted  . Candidal esophagitis ([place]) 09/26/2015  . Acute blood loss anemia 09/26/2015  . Candida esophagitis ([place])   . Melena   . UGIB (upper gastrointestinal bleed)   . Ulcerative esophagitis   . Hemorrhagic shock   . Shoulder pain, left 09/20/2015  .  Hyperlipidemia 09/20/2015  . Streptococcal bacteremia 09/12/2015  . Septic shock ([place]) 09/11/2015  . High anion gap metabolic acidosis [id number]  . Acute renal failure (ARF) ([place]) 09/11/2015  . Hypokalemia 09/11/2015  . Severe sepsis ([place]) 09/11/2015  . Lactic acid acidosis 09/11/2015  . S/P revision of total knee 08/17/2013  . DM (diabetes mellitus) ([place]) 06/16/2013  . HTN (hypertension) 06/16/2013  . OSA (obstructive sleep apnea) 06/16/2013  . Infection of prosthetic left knee joint ([place]) 06/16/2013  . S/P left knee resection, abx spacer 06/15/2013  . Postoperative anemia due to acute blood loss 04/30/2012  . Obese 04/30/2012  . Hyponatremia 04/30/2012    Past Surgical History:  Procedure Laterality Date  . ESOPHAGOGASTRODUODENOSCOPY N/A 09/22/2015   Procedure: ESOPHAGOGASTRODUODENOSCOPY (EGD);  Surgeon: Gatha Mayer, MD;  Location: [facility];  Service: Endoscopy;  Laterality: N/A;  do in ICU  . EXCISIONAL TOTAL KNEE ARTHROPLASTY Left 06/15/2013   Procedure: LEFT RESECTION TOTAL KNEE ARTHROPLASTY WITH PLACEMENT OF ANTIBIOTIC SPACERS;  Surgeon: Mauri Pole, MD;  Location: [facility];  Service: Orthopedics;  Laterality: Left;  . I&D KNEE WITH POLY EXCHANGE Left 09/12/2015   Procedure: IRRIGATION AND DEBRIDEMENT KNEE WITH POLY EXCHANGE LEFT TOTAL KNEE;  Surgeon: Paralee Cancel, MD;  Location: [facility];  Service: Orthopedics;  Laterality: Left;  . left ankle  2006  . NASAL SEPTUM SURGERY  2011  . TONSILLECTOMY     as child  .  TOTAL KNEE ARTHROPLASTY  04/29/2012   Procedure: TOTAL KNEE ARTHROPLASTY;  Surgeon: Mauri Pole, MD;  Location: [facility];  Service: Orthopedics;  Laterality: Left;  . TOTAL KNEE REVISION Left 08/17/2013   Procedure: REIMPLANTATION LEFT TOTAL KNEE ARTHROPLASTY WITH REMOVAL OF ANTIBIOTIC SPACER ;  Surgeon: Mauri Pole, MD;  Location: [facility];  Service: Orthopedics;  Laterality: Left;       Home Medications    Prior to Admission medications   Medication Sig  Start Date End Date Taking? Authorizing Provider  acetaminophen (TYLENOL) 325 MG tablet Take 2 tablets (650 mg total) by mouth every 6 (six) hours as needed for mild pain (or Fever >/= 101). 09/17/15  Yes Silver Huguenin Elgergawy, MD  amLODipine (NORVASC) 10 MG tablet Take 10 mg by mouth daily. 02/16/16  Yes Historical Provider, MD  amoxicillin (AMOXIL) 500 MG capsule Take 1 capsule (500 mg total) by mouth 3 (three) times daily. 11/22/15  Yes [name], MD  aspirin EC 81 MG tablet Take 81 mg by mouth daily.   Yes Historical Provider, MD  furosemide (LASIX) 40 MG tablet Take 40 mg by mouth daily before breakfast.   Yes Historical Provider, MD  glimepiride (AMARYL) 4 MG tablet Take 4 mg by mouth daily before breakfast.   Yes Historical Provider, MD  JARDIANCE 25 MG TABS tablet Take 25 mg by mouth daily. 02/07/16  Yes Historical Provider, MD  metFORMIN (GLUCOPHAGE) 500 MG tablet Take 1,000 mg by mouth 2 (two) times daily with a meal.    Yes Historical Provider, MD  pravastatin (PRAVACHOL) 40 MG tablet Take 40 mg by mouth every evening.   Yes Historical Provider, MD  ramipril (ALTACE) 10 MG capsule Take 10 mg by mouth daily. 02/05/16  Yes Historical Provider, MD  TRULICITY 1.5 0000000 SOPN Inject 1.5 mg into the skin every Monday. 02/10/16  Yes Historical Provider, MD  enoxaparin (LOVENOX) 40 MG/0.4ML injection Inject 0.4 mLs (40 mg total) into the skin daily. To continue for 30 days for DVT prophylaxis then stop. Patient not taking: Reported on 02/26/2016 09/17/15   Albertine Patricia, MD  ferrous sulfate 325 (65 FE) MG tablet Take 1 tablet (325 mg total) by mouth 2 (two) times daily with a meal. Patient not taking: Reported on 02/26/2016 09/26/15   Janece Canterbury, MD  insulin aspart (NOVOLOG) 100 UNIT/ML injection Inject 0-9 Units into the skin 3 (three) times daily with meals. Patient not taking: Reported on 02/26/2016 09/17/15   Silver Huguenin Elgergawy, MD  pantoprazole (PROTONIX) 40 MG tablet Take 1 tablet (40 mg  total) by mouth 2 (two) times daily before a meal. Patient not taking: Reported on 02/26/2016 09/26/15   Janece Canterbury, MD  phosphorus (K PHOS NEUTRAL) [id number] MG tablet Take 1 tablet (250 mg total) by mouth daily. Patient not taking: Reported on 02/26/2016 09/17/15   Silver Huguenin Elgergawy, MD  saccharomyces boulardii (FLORASTOR) 250 MG capsule Take 1 capsule (250 mg total) by mouth 2 (two) times daily. Patient not taking: Reported on 02/26/2016 09/26/15   Janece Canterbury, MD    Family History Family History  Problem Relation Age of Onset  . Cancer Father     Lung    Social History Social History  Substance Use Topics  . Smoking status: Never Smoker  . Smokeless tobacco: Never Used  . Alcohol use No     Allergies   Review of patient's allergies indicates no known allergies.   Review of Systems Review of Systems  Musculoskeletal:  Positive for myalgias.  Skin: Positive for wound. Negative for color change.  Neurological: Negative for weakness and numbness.   Physical Exam Updated Vital Signs BP 105/75 (BP Location: Left Arm)   Pulse 105   Temp 98.7 F (37.1 C) (Oral)   Resp 18   Ht 6\' 1"  (1.854 m)   Wt 236 lb (107 kg)   SpO2 100%   BMI 31.14 kg/m   Physical Exam  Constitutional: He is oriented to person, place, and time. He appears well-developed and well-nourished.  HENT:  Head: Normocephalic and atraumatic.  Eyes: EOM are normal.  Neck: Normal range of motion.  Cardiovascular: Normal rate.   Pulmonary/Chest: Effort normal. No respiratory distress.  Abdominal: Soft.  Musculoskeletal: Normal range of motion.  Neurological: He is alert and oriented to person, place, and time.  Skin: Skin is warm and dry. Laceration noted.  0.5 cm x 1 cm skin avulsion to the right distal thumb. Mild bleeding noted during examination.   Psychiatric: He has a normal mood and affect.  Nursing note and vitals reviewed.  ED Treatments / Results  Labs (all labs ordered are listed,  but only abnormal results are displayed) Labs Reviewed - No data to display  EKG  EKG Interpretation None       Radiology No results found.  Procedures .Marland KitchenLaceration Repair Date/Time: 02/26/2016 5:40 PM Performed by: Shary Decamp Authorized by: Shary Decamp   Consent:    Consent obtained:  Verbal Anesthesia (see MAR for exact dosages):    Anesthesia method:  None Laceration details:    Location:  Finger   Finger location:  R thumb   Length (cm):  1 Exploration:    Hemostasis achieved with:  Cautery and direct pressure Treatment:    Area cleansed with:  Saline and Betadine   Amount of cleaning:  Standard   Irrigation method:  Syringe Post-procedure details:    Dressing:  Non-adherent dressing and bulky dressing   Patient tolerance of procedure:  Tolerated well, no immediate complications    DIAGNOSTIC STUDIES: Oxygen Saturation is 100% on RA, normal by my interpretation.    COORDINATION OF CARE: 3:51 PM Discussed next steps with pt. Pt verbalized understanding and is agreeable with the plan.    Medications Ordered in ED Medications  Tdap (BOOSTRIX) injection 0.5 mL (not administered)     Initial Impression / Assessment and Plan / ED Course  I have reviewed the triage vital signs and the nursing notes.  Pertinent labs & imaging results that were available during my care of the patient were reviewed by me and considered in my medical decision making (see chart for details).  Clinical Course    Tetanus updated in the ED. Laceration occurred < 12 hours prior to repair. Discussed laceration care with pt and answered questions. Pt to f/u should there be signs of dehiscence or infection. Pt is hemodynamically stable with no complaints prior to dc.      Final Clinical Impressions(s) / ED Diagnoses  I have reviewed the relevant previous healthcare records. I obtained HPI from historian.  ED Course:  Assessment: Ptwith skin avulsion to right lateral thumb from  mandolin this AM.Tetanus updated in the ED. Laceration occurred < 12 hours prior to repair. Bleeding controlled with electric cautery after attempts with quick clot powder in ED. Wound too wide and shallow for laceration repair or skin adhesive. Wrapped in sterile bandage. Discussed laceration care with pt and answered questions. Given Rx ABX. Pt to f/u should there  be signs of dehiscence or infection. Pt is hemodynamically stable with no complaints prior to dc.    Disposition/Plan:  DC Home Additional Verbal discharge instructions given and discussed with patient.  Pt Instructed to f/u with PCP in the next week for evaluation and treatment of symptoms. Return precautions given Pt acknowledges and agrees with plan  Supervising Physician Duffy Bruce, MD   Final diagnoses:  Avulsion of skin of right thumb, initial encounter   I personally performed the services described in this documentation, which was scribed in my presence. The recorded information has been reviewed and is accurate.   New Prescriptions New Prescriptions   No medications on file     Shary Decamp, PA-C 02/26/16 1741    Duffy Bruce, MD 02/27/16 1057

## 2016-02-26 NOTE — Discharge Instructions (Signed)
Please read and follow all provided instructions.  Your diagnoses today include:  1. Avulsion of skin of right thumb, initial encounter     Tests performed today include: Vital signs. See below for your results today.   Medications prescribed:   Take any prescribed medications only as directed.   Home care instructions:  Follow any educational materials and wound care instructions contained in this packet.   Return instructions:  Return to the Emergency Department if you have: Fever Worsening pain Worsening swelling of the wound Pus draining from the wound Redness of the skin that moves away from the wound, especially if it streaks away from the affected area  Any other emergent concerns  Your vital signs today were: BP 105/75 (BP Location: Left Arm)    Pulse 105    Temp 98.7 F (37.1 C) (Oral)    Resp 18    Ht 6\' 1"  (1.854 m)    Wt 107 kg    SpO2 100%    BMI 31.14 kg/m  If your blood pressure (BP) was elevated above 135/85 this visit, please have this repeated by your doctor within one month. --------------

## 2016-03-06 ENCOUNTER — Ambulatory Visit: Payer: Medicare Other | Admitting: Internal Medicine

## 2016-03-07 DIAGNOSIS — M19012 Primary osteoarthritis, left shoulder: Secondary | ICD-10-CM | POA: Diagnosis not present

## 2016-03-07 DIAGNOSIS — M19011 Primary osteoarthritis, right shoulder: Secondary | ICD-10-CM | POA: Diagnosis not present

## 2016-03-07 DIAGNOSIS — M25562 Pain in left knee: Secondary | ICD-10-CM | POA: Diagnosis not present

## 2016-03-07 DIAGNOSIS — M6281 Muscle weakness (generalized): Secondary | ICD-10-CM | POA: Diagnosis not present

## 2016-03-14 DIAGNOSIS — M19012 Primary osteoarthritis, left shoulder: Secondary | ICD-10-CM | POA: Diagnosis not present

## 2016-03-14 DIAGNOSIS — M6281 Muscle weakness (generalized): Secondary | ICD-10-CM | POA: Diagnosis not present

## 2016-03-14 DIAGNOSIS — M25562 Pain in left knee: Secondary | ICD-10-CM | POA: Diagnosis not present

## 2016-03-14 DIAGNOSIS — M19011 Primary osteoarthritis, right shoulder: Secondary | ICD-10-CM | POA: Diagnosis not present

## 2016-03-21 ENCOUNTER — Encounter: Payer: Self-pay | Admitting: Internal Medicine

## 2016-03-21 ENCOUNTER — Ambulatory Visit (INDEPENDENT_AMBULATORY_CARE_PROVIDER_SITE_OTHER): Payer: Medicare Other | Admitting: Internal Medicine

## 2016-03-21 VITALS — BP 115/78 | HR 102 | Temp 97.6°F | Wt 237.5 lb

## 2016-03-21 DIAGNOSIS — Z23 Encounter for immunization: Secondary | ICD-10-CM

## 2016-03-21 DIAGNOSIS — T8454XA Infection and inflammatory reaction due to internal left knee prosthesis, initial encounter: Secondary | ICD-10-CM | POA: Diagnosis not present

## 2016-03-21 LAB — SEDIMENTATION RATE: Sed Rate: 9 mm/hr (ref 0–20)

## 2016-03-21 LAB — C-REACTIVE PROTEIN: CRP: 2.3 mg/dL — AB (ref <0.60)

## 2016-03-21 NOTE — Assessment & Plan Note (Signed)
He continues to improve and I am hopeful that his group B streptococcal prosthetic knee infection has now been cured. He will stop amoxicillin and follow-up in 2 months. I will repeat his inflammatory markers today.

## 2016-03-21 NOTE — Progress Notes (Addendum)
[facility] for Infectious Disease  Patient Active Problem List   Diagnosis Date Noted  . Streptococcal bacteremia 09/12/2015    Priority: High  . Septic shock ([place]) 09/11/2015    Priority: High  . Severe sepsis ([place]) 09/11/2015    Priority: High  . Infection of prosthetic left knee joint ([place]) 06/16/2013    Priority: High  . S/P revision of total knee 08/17/2013    Priority: Medium  . S/P left knee resection, abx spacer 06/15/2013    Priority: Medium  . Candidal esophagitis ([place]) 09/26/2015  . Acute blood loss anemia 09/26/2015  . Candida esophagitis ([place])   . Melena   . UGIB (upper gastrointestinal bleed)   . Ulcerative esophagitis   . Hemorrhagic shock   . Shoulder pain, left 09/20/2015  . Hyperlipidemia 09/20/2015  . High anion gap metabolic acidosis [id number]  . Acute renal failure (ARF) ([place]) 09/11/2015  . Hypokalemia 09/11/2015  . Lactic acid acidosis 09/11/2015  . DM (diabetes mellitus) ([place]) 06/16/2013  . HTN (hypertension) 06/16/2013  . OSA (obstructive sleep apnea) 06/16/2013  . Postoperative anemia due to acute blood loss 04/30/2012  . Obese 04/30/2012  . Hyponatremia 04/30/2012    Patient's Medications  New Prescriptions   No medications on file  Previous Medications   ACETAMINOPHEN (TYLENOL) 325 MG TABLET    Take 2 tablets (650 mg total) by mouth every 6 (six) hours as needed for mild pain (or Fever >/= 101).   AMLODIPINE (NORVASC) 10 MG TABLET    Take 10 mg by mouth daily.   ASPIRIN EC 81 MG TABLET    Take 81 mg by mouth daily.   CEPHALEXIN (KEFLEX) 500 MG CAPSULE    Take 1 capsule (500 mg total) by mouth 2 (two) times daily.   ENOXAPARIN (LOVENOX) 40 MG/0.4ML INJECTION    Inject 0.4 mLs (40 mg total) into the skin daily. To continue for 30 days for DVT prophylaxis then stop.   FERROUS SULFATE 325 (65 FE) MG TABLET    Take 1 tablet (325 mg total) by mouth 2 (two) times daily with a meal.   FUROSEMIDE (LASIX) 40 MG TABLET    Take 40 mg  by mouth daily before breakfast.   GLIMEPIRIDE (AMARYL) 4 MG TABLET    Take 4 mg by mouth daily before breakfast.   INSULIN ASPART (NOVOLOG) 100 UNIT/ML INJECTION    Inject 0-9 Units into the skin 3 (three) times daily with meals.   JARDIANCE 25 MG TABS TABLET    Take 25 mg by mouth daily.   METFORMIN (GLUCOPHAGE) 500 MG TABLET    Take 1,000 mg by mouth 2 (two) times daily with a meal.    PANTOPRAZOLE (PROTONIX) 40 MG TABLET    Take 1 tablet (40 mg total) by mouth 2 (two) times daily before a meal.   PHOSPHORUS (K PHOS NEUTRAL) [id number] MG TABLET    Take 1 tablet (250 mg total) by mouth daily.   PRAVASTATIN (PRAVACHOL) 40 MG TABLET    Take 40 mg by mouth every evening.   RAMIPRIL (ALTACE) 10 MG CAPSULE    Take 10 mg by mouth daily.   SACCHAROMYCES BOULARDII (FLORASTOR) 250 MG CAPSULE    Take 1 capsule (250 mg total) by mouth 2 (two) times daily.   TRULICITY 1.5 0000000 SOPN    Inject 1.5 mg into the skin every Monday.  Modified Medications   No medications on file  Discontinued Medications  AMOXICILLIN (AMOXIL) 500 MG CAPSULE    Take 1 capsule (500 mg total) by mouth 3 (three) times daily.    Subjective: Hunter Santos is in for his routine follow-up visit. He has now completed 6 months of antibiotic therapy for his group B streptococcal left prosthetic knee infection. He has been taking amoxicillin and has had no problems tolerating it. He is having very minimal pain in his left knee. He only rarely needs to take a [employer] powder for discomfort. He has been working in the garden and resuming all of his usual activities.  Review of Systems: Review of Systems  Constitutional: Negative for chills, diaphoresis, fever, malaise/fatigue and weight loss.  HENT: Negative for sore throat.   Respiratory: Negative for cough, sputum production and shortness of breath.   Cardiovascular: Negative for chest pain.  Gastrointestinal: Negative for abdominal pain, diarrhea, nausea and vomiting.  Musculoskeletal:  Negative for joint pain and myalgias.  Skin: Negative for rash.  Neurological: Negative for headaches.    Past Medical History:  Diagnosis Date  . Arthritis   . Diabetes mellitus   . History of transfusion   . Hypertension   . Sleep apnea    had surgery for deviated septum previously    Social History  Substance Use Topics  . Smoking status: Never Smoker  . Smokeless tobacco: Never Used  . Alcohol use No    Family History  Problem Relation Age of Onset  . Cancer Father     Lung    No Known Allergies  Objective: Vitals:   03/21/16 0952  BP: 115/78  Pulse: (!) 102  Temp: 97.6 F (36.4 C)  TempSrc: Oral  Weight: 237 lb 8 oz (107.7 kg)   Body mass index is 31.33 kg/m.  Physical Exam  Constitutional: He is oriented to person, place, and time.  He is joking and in good spirits.  Cardiovascular: Normal rate and regular rhythm.   No murmur heard. Pulmonary/Chest: Effort normal and breath sounds normal.  Musculoskeletal:  His left knee is chronically swollen and unchanged. His incision is fully healed. There is no unusual warmth or erythema.  Neurological: He is alert and oriented to person, place, and time.  Psychiatric: Mood and affect normal.    Lab Results Sed Rate (mm/hr)  Date Value  12/01/2015 8  10/17/2015 66 (H)  09/14/2015 76 (H)   CRP (mg/dL)  Date Value  12/01/2015 1.8 (H)  10/17/2015 10.5 (H)  09/14/2015 14.6 (H)     Problem List Items Addressed This Visit      High   Infection of prosthetic left knee joint (Hunter Santos)    He continues to improve and I am hopeful that his group B streptococcal prosthetic knee infection has now been cured. He will stop amoxicillin and follow-up in 2 months. I will repeat his inflammatory markers today.      Relevant Orders   C-reactive protein   Sedimentation rate    Other Visit Diagnoses    Need for prophylactic vaccination and inoculation against influenza    -  Primary   Relevant Orders   Flu Vaccine  QUAD 36+ mos IM (Fluarix & Fluzone Quad PF       [name], MD [facility] [place] [phone] pager   [phone] cell 03/21/2016, 10:08 AM   Addendum:  Sed Rate (mm/hr)  Date Value  03/21/2016 9  12/01/2015 8  10/17/2015 66 (H)   CRP (mg/dL)  Date Value  03/21/2016 2.3 (  H)  12/01/2015 1.8 (H)  10/17/2015 10.5 (H)   Mr. Muro's sedimentation rate remains normal but his C-reactive protein actually went back up slightly to 2.3. Current infectious disease Society of [place] guidelines could not reach any consensus about optimal duration of therapy in patients with prosthetic joint infection to undergo incision and drainage and polydipsia exchange with retention of the prosthesis. Some experts favor stopping at 6 months if there is been good clinical improvement while others recommend lifelong suppressive therapy. I reviewed management options with Mr. Sexson. He is having no problems tolerating his amoxicillin and is concerned about the possibility of his infection flaring up again. Therefore he will continue on amoxicillin at least until his follow-up visit with me.  [name], MD [facility] [phone] pager   [phone] cell 03/22/2016, 12:31 PM

## 2016-03-22 DIAGNOSIS — M19012 Primary osteoarthritis, left shoulder: Secondary | ICD-10-CM | POA: Diagnosis not present

## 2016-03-22 DIAGNOSIS — M6281 Muscle weakness (generalized): Secondary | ICD-10-CM | POA: Diagnosis not present

## 2016-03-22 DIAGNOSIS — M25562 Pain in left knee: Secondary | ICD-10-CM | POA: Diagnosis not present

## 2016-03-22 DIAGNOSIS — M19011 Primary osteoarthritis, right shoulder: Secondary | ICD-10-CM | POA: Diagnosis not present

## 2016-05-08 DIAGNOSIS — E789 Disorder of lipoprotein metabolism, unspecified: Secondary | ICD-10-CM | POA: Diagnosis not present

## 2016-05-08 DIAGNOSIS — E118 Type 2 diabetes mellitus with unspecified complications: Secondary | ICD-10-CM | POA: Diagnosis not present

## 2016-05-15 DIAGNOSIS — E118 Type 2 diabetes mellitus with unspecified complications: Secondary | ICD-10-CM | POA: Diagnosis not present

## 2016-05-15 DIAGNOSIS — Z23 Encounter for immunization: Secondary | ICD-10-CM | POA: Diagnosis not present

## 2016-05-22 ENCOUNTER — Encounter: Payer: Self-pay | Admitting: Internal Medicine

## 2016-05-22 ENCOUNTER — Ambulatory Visit (INDEPENDENT_AMBULATORY_CARE_PROVIDER_SITE_OTHER): Payer: Medicare Other | Admitting: Internal Medicine

## 2016-05-22 DIAGNOSIS — T8454XD Infection and inflammatory reaction due to internal left knee prosthesis, subsequent encounter: Secondary | ICD-10-CM | POA: Diagnosis not present

## 2016-05-22 LAB — BASIC METABOLIC PANEL
BUN: 14 mg/dL (ref 7–25)
CO2: 26 mmol/L (ref 20–31)
Calcium: 9.6 mg/dL (ref 8.6–10.3)
Chloride: 104 mmol/L (ref 98–110)
Creat: 0.84 mg/dL (ref 0.70–1.25)
Glucose, Bld: 90 mg/dL (ref 65–99)
POTASSIUM: 4.9 mmol/L (ref 3.5–5.3)
SODIUM: 139 mmol/L (ref 135–146)

## 2016-05-22 LAB — SEDIMENTATION RATE: Sed Rate: 5 mm/hr (ref 0–20)

## 2016-05-22 NOTE — Progress Notes (Signed)
[facility] for Infectious Disease  Patient Active Problem List   Diagnosis Date Noted  . Streptococcal bacteremia 09/12/2015    Priority: High  . Septic shock ([place]) 09/11/2015    Priority: High  . Severe sepsis ([place]) 09/11/2015    Priority: High  . Infection of prosthetic left knee joint ([place]) 06/16/2013    Priority: High  . S/P revision of total knee 08/17/2013    Priority: Medium  . S/P left knee resection, abx spacer 06/15/2013    Priority: Medium  . Candidal esophagitis ([place]) 09/26/2015  . Acute blood loss anemia 09/26/2015  . Candida esophagitis ([place])   . Melena   . UGIB (upper gastrointestinal bleed)   . Ulcerative esophagitis   . Hemorrhagic shock   . Shoulder pain, left 09/20/2015  . Hyperlipidemia 09/20/2015  . High anion gap metabolic acidosis [id number]  . Acute renal failure (ARF) ([place]) 09/11/2015  . Hypokalemia 09/11/2015  . Lactic acid acidosis 09/11/2015  . DM (diabetes mellitus) ([place]) 06/16/2013  . HTN (hypertension) 06/16/2013  . OSA (obstructive sleep apnea) 06/16/2013  . Postoperative anemia due to acute blood loss 04/30/2012  . Obese 04/30/2012  . Hyponatremia 04/30/2012    Patient's Medications  New Prescriptions   No medications on file  Previous Medications   ACETAMINOPHEN (TYLENOL) 325 MG TABLET    Take 2 tablets (650 mg total) by mouth every 6 (six) hours as needed for mild pain (or Fever >/= 101).   AMLODIPINE (NORVASC) 10 MG TABLET    Take 10 mg by mouth daily.   ASPIRIN EC 81 MG TABLET    Take 81 mg by mouth daily.   ENOXAPARIN (LOVENOX) 40 MG/0.4ML INJECTION    Inject 0.4 mLs (40 mg total) into the skin daily. To continue for 30 days for DVT prophylaxis then stop.   FERROUS SULFATE 325 (65 FE) MG TABLET    Take 1 tablet (325 mg total) by mouth 2 (two) times daily with a meal.   FUROSEMIDE (LASIX) 40 MG TABLET    Take 40 mg by mouth daily before breakfast.   GLIMEPIRIDE (AMARYL) 4 MG TABLET    Take 4 mg by mouth daily  before breakfast.   INSULIN ASPART (NOVOLOG) 100 UNIT/ML INJECTION    Inject 0-9 Units into the skin 3 (three) times daily with meals.   JARDIANCE 25 MG TABS TABLET    Take 25 mg by mouth daily.   METFORMIN (GLUCOPHAGE) 500 MG TABLET    Take 1,000 mg by mouth 2 (two) times daily with a meal.    PANTOPRAZOLE (PROTONIX) 40 MG TABLET    Take 1 tablet (40 mg total) by mouth 2 (two) times daily before a meal.   PHOSPHORUS (K PHOS NEUTRAL) 155-852-130 MG TABLET    Take 1 tablet (250 mg total) by mouth daily.   PRAVASTATIN (PRAVACHOL) 40 MG TABLET    Take 40 mg by mouth every evening.   RAMIPRIL (ALTACE) 10 MG CAPSULE    Take 10 mg by mouth daily.   SACCHAROMYCES BOULARDII (FLORASTOR) 250 MG CAPSULE    Take 1 capsule (250 mg total) by mouth 2 (two) times daily.   TRULICITY 1.5 0000000 SOPN    Inject 1.5 mg into the skin every Monday.  Modified Medications   No medications on file  Discontinued Medications   CEPHALEXIN (KEFLEX) 500 MG CAPSULE    Take 1 capsule (500 mg total) by mouth 2 (two) times daily.  Subjective: Hunter Santos is in for his routine follow-up visit. He is now completed 8 and half months of antibiotic therapy for group B streptococcal left prosthetic knee infection. He has had no problems tolerating his amoxicillin. He has fairly minimal pain in his left knee. He rates it at about 2-3 out of 10 and states that it is intermittent. He will take Goody's powder or Aleve if he is going to be out in the yard working. He estimates that he take something may be 3-4 days each week. Overall he feels that he is improving a little bit.  Review of Systems: Review of Systems  Constitutional: Negative for chills, diaphoresis and fever.  Musculoskeletal: Positive for joint pain.    Past Medical History:  Diagnosis Date  . Arthritis   . Diabetes mellitus   . History of transfusion   . Hypertension   . Sleep apnea    had surgery for deviated septum previously    Social History  Substance Use  Topics  . Smoking status: Never Smoker  . Smokeless tobacco: Never Used  . Alcohol use No    Family History  Problem Relation Age of Onset  . Cancer Father     Lung    No Known Allergies  Objective: Vitals:   05/22/16 0952  BP: 111/71  Pulse: 80  Temp: 97.9 F (36.6 C)  TempSrc: Oral  Weight: 236 lb (107 kg)  Height: 6\' 1"  (1.854 m)   Body mass index is 31.14 kg/m.  Physical Exam  Constitutional: He is oriented to person, place, and time.  He is in good spirits.  Cardiovascular: Normal rate and regular rhythm.   No murmur heard. Pulmonary/Chest: Effort normal and breath sounds normal.  Musculoskeletal:  He has chronic, diffuse swelling around his left knee. His incision is fully healed. There is slight warmth but good range of motion and no erythema.  Neurological: He is alert and oriented to person, place, and time.  Psychiatric: Mood and affect normal.    Lab Results Sed Rate (mm/hr)  Date Value  03/21/2016 9  12/01/2015 8  10/17/2015 66 (H)   CRP (mg/dL)  Date Value  03/21/2016 2.3 (H)  12/01/2015 1.8 (H)  10/17/2015 10.5 (H)     Problem List Items Addressed This Visit      High   Infection of prosthetic left knee joint ([place])    He is doing well without any signs of active infection on exam. His sedimentation rate has normalized but his last C-reactive protein was still slightly elevated. He is very nervous about stopping antibiotics. He is tolerating amoxicillin well and prefers to stay on that then to run any risk of having to go back for further surgery. I asked him to decrease his amoxicillin to twice daily. He will follow-up in 6 months.      Relevant Orders   C-reactive protein   Sedimentation rate   Basic metabolic panel       [name], MD [facility] for [place] [phone] pager   [phone] cell 05/22/2016, 10:07 AM

## 2016-05-22 NOTE — Assessment & Plan Note (Signed)
He is doing well without any signs of active infection on exam. His sedimentation rate has normalized but his last C-reactive protein was still slightly elevated. He is very nervous about stopping antibiotics. He is tolerating amoxicillin well and prefers to stay on that then to run any risk of having to go back for further surgery. I asked him to decrease his amoxicillin to twice daily. He will follow-up in 6 months.

## 2016-05-23 LAB — C-REACTIVE PROTEIN: CRP: 13.1 mg/L — AB (ref <8.0)

## 2016-06-05 ENCOUNTER — Other Ambulatory Visit: Payer: Self-pay | Admitting: Internal Medicine

## 2016-06-05 DIAGNOSIS — T8454XA Infection and inflammatory reaction due to internal left knee prosthesis, initial encounter: Secondary | ICD-10-CM

## 2016-06-25 DIAGNOSIS — L570 Actinic keratosis: Secondary | ICD-10-CM | POA: Diagnosis not present

## 2016-08-20 DIAGNOSIS — I10 Essential (primary) hypertension: Secondary | ICD-10-CM | POA: Diagnosis not present

## 2016-08-20 DIAGNOSIS — Z125 Encounter for screening for malignant neoplasm of prostate: Secondary | ICD-10-CM | POA: Diagnosis not present

## 2016-08-20 DIAGNOSIS — E789 Disorder of lipoprotein metabolism, unspecified: Secondary | ICD-10-CM | POA: Diagnosis not present

## 2016-08-20 DIAGNOSIS — E118 Type 2 diabetes mellitus with unspecified complications: Secondary | ICD-10-CM | POA: Diagnosis not present

## 2016-08-28 DIAGNOSIS — N390 Urinary tract infection, site not specified: Secondary | ICD-10-CM | POA: Diagnosis not present

## 2016-08-28 DIAGNOSIS — E789 Disorder of lipoprotein metabolism, unspecified: Secondary | ICD-10-CM | POA: Diagnosis not present

## 2016-08-28 DIAGNOSIS — E118 Type 2 diabetes mellitus with unspecified complications: Secondary | ICD-10-CM | POA: Diagnosis not present

## 2016-08-28 DIAGNOSIS — I10 Essential (primary) hypertension: Secondary | ICD-10-CM | POA: Diagnosis not present

## 2016-08-28 DIAGNOSIS — R011 Cardiac murmur, unspecified: Secondary | ICD-10-CM | POA: Diagnosis not present

## 2016-08-29 ENCOUNTER — Encounter: Payer: Self-pay | Admitting: Internal Medicine

## 2016-09-13 DIAGNOSIS — E118 Type 2 diabetes mellitus with unspecified complications: Secondary | ICD-10-CM | POA: Diagnosis not present

## 2016-09-13 DIAGNOSIS — N390 Urinary tract infection, site not specified: Secondary | ICD-10-CM | POA: Diagnosis not present

## 2016-09-24 DIAGNOSIS — D485 Neoplasm of uncertain behavior of skin: Secondary | ICD-10-CM | POA: Diagnosis not present

## 2016-09-24 DIAGNOSIS — L570 Actinic keratosis: Secondary | ICD-10-CM | POA: Diagnosis not present

## 2016-09-24 DIAGNOSIS — L821 Other seborrheic keratosis: Secondary | ICD-10-CM | POA: Diagnosis not present

## 2016-10-04 ENCOUNTER — Ambulatory Visit (AMBULATORY_SURGERY_CENTER): Payer: Self-pay

## 2016-10-04 VITALS — Ht 73.0 in | Wt 244.2 lb

## 2016-10-04 DIAGNOSIS — Z1211 Encounter for screening for malignant neoplasm of colon: Secondary | ICD-10-CM

## 2016-10-04 NOTE — Progress Notes (Signed)
Denies allergies to eggs or soy products. Denies complication of anesthesia or sedation. Denies use of weight loss medication. Denies use of O2.   Emmi instructions declined.  

## 2016-10-14 HISTORY — PX: COLONOSCOPY: SHX174

## 2016-10-18 ENCOUNTER — Encounter: Payer: Self-pay | Admitting: Internal Medicine

## 2016-10-18 ENCOUNTER — Ambulatory Visit (AMBULATORY_SURGERY_CENTER): Payer: Medicare Other | Admitting: Internal Medicine

## 2016-10-18 VITALS — BP 119/74 | HR 75 | Temp 96.9°F | Resp 16 | Ht 73.0 in | Wt 244.0 lb

## 2016-10-18 DIAGNOSIS — Z1211 Encounter for screening for malignant neoplasm of colon: Secondary | ICD-10-CM

## 2016-10-18 DIAGNOSIS — G4733 Obstructive sleep apnea (adult) (pediatric): Secondary | ICD-10-CM | POA: Diagnosis not present

## 2016-10-18 DIAGNOSIS — D124 Benign neoplasm of descending colon: Secondary | ICD-10-CM

## 2016-10-18 DIAGNOSIS — I10 Essential (primary) hypertension: Secondary | ICD-10-CM | POA: Diagnosis not present

## 2016-10-18 DIAGNOSIS — Z1212 Encounter for screening for malignant neoplasm of rectum: Secondary | ICD-10-CM | POA: Diagnosis not present

## 2016-10-18 DIAGNOSIS — D123 Benign neoplasm of transverse colon: Secondary | ICD-10-CM

## 2016-10-18 DIAGNOSIS — E119 Type 2 diabetes mellitus without complications: Secondary | ICD-10-CM | POA: Diagnosis not present

## 2016-10-18 MED ORDER — SODIUM CHLORIDE 0.9 % IV SOLN: 500.0000 mL | INTRAVENOUS | Status: DC

## 2016-10-18 NOTE — Patient Instructions (Addendum)
   I found and removed 3 tiny polyps - all look benign. I will let you know pathology results and when to have another routine colonoscopy by mail and/or My Chart.  I appreciate the opportunity to care for you. Gatha Mayer, MD, [facility]  Handout given : Polyps.  YOU HAD AN ENDOSCOPIC PROCEDURE TODAY AT [facility]:   Refer to the procedure report that was given to you for any specific questions about what was found during the examination.  If the procedure report does not answer your questions, please call your gastroenterologist to clarify.  If you requested that your care partner not be given the details of your procedure findings, then the procedure report has been included in a sealed envelope for you to review at your convenience later.  YOU SHOULD EXPECT: Some feelings of bloating in the abdomen. Passage of more gas than usual.  Walking can help get rid of the air that was put into your GI tract during the procedure and reduce the bloating. If you had a lower endoscopy (such as a colonoscopy or flexible sigmoidoscopy) you may notice spotting of blood in your stool or on the toilet paper. If you underwent a bowel prep for your procedure, you may not have a normal bowel movement for a few days.  Please Note:  You might notice some irritation and congestion in your nose or some drainage.  This is from the oxygen used during your procedure.  There is no need for concern and it should clear up in a day or so.  SYMPTOMS TO REPORT IMMEDIATELY:   Following lower endoscopy (colonoscopy or flexible sigmoidoscopy):  Excessive amounts of blood in the stool  Significant tenderness or worsening of abdominal pains  Swelling of the abdomen that is new, acute  Fever of 100F or higher   For urgent or emergent issues, a gastroenterologist can be reached at any hour by calling [phone].   DIET:  We do recommend a small meal at first, but then you may proceed to your regular  diet.  Drink plenty of fluids but you should avoid alcoholic beverages for 24 hours.  ACTIVITY:  You should plan to take it easy for the rest of today and you should NOT DRIVE or use heavy machinery until tomorrow (because of the sedation medicines used during the test).    FOLLOW UP: Our staff will call the number listed on your records the next business day following your procedure to check on you and address any questions or concerns that you may have regarding the information given to you following your procedure. If we do not reach you, we will leave a message.  However, if you are feeling well and you are not experiencing any problems, there is no need to return our call.  We will assume that you have returned to your regular daily activities without incident.  If any biopsies were taken you will be contacted by phone or by letter within the next 1-3 weeks.  Please call us at [phone] if you have not heard about the biopsies in 3 weeks.    SIGNATURES/CONFIDENTIALITY: You and/or your care partner have signed paperwork which will be entered into your electronic medical record.  These signatures attest to the fact that that the information above on your After Visit Summary has been reviewed and is understood.  Full responsibility of the confidentiality of this discharge information lies with you and/or your care-partner.

## 2016-10-18 NOTE — Progress Notes (Signed)
Pt's states no medical or surgical changes since previsit or office visit. 

## 2016-10-18 NOTE — Progress Notes (Signed)
Report to PACU, RN, vss, BBS= Clear.  

## 2016-10-18 NOTE — Progress Notes (Signed)
Called to room to assist during endoscopic procedure.  Patient ID and intended procedure confirmed with present staff. Received instructions for my participation in the procedure from the performing physician.  

## 2016-10-18 NOTE — Op Note (Signed)
[facility] Patient Name: Hunter Santos Procedure Date: 10/18/2016 9:28 AM MRN: [id number] Endoscopist: Gatha Mayer , MD Age: 66 Referring MD:  Date of Birth: [birth year] Gender: Male Account #: [id number] Procedure:                Colonoscopy Indications:              Screening for colorectal malignant neoplasm Medicines:                Propofol per Anesthesia, Monitored Anesthesia Care Procedure:                Pre-Anesthesia Assessment:                           - Prior to the procedure, a History and Physical                            was performed, and patient medications and                            allergies were reviewed. The patient's tolerance of                            previous anesthesia was also reviewed. The risks                            and benefits of the procedure and the sedation                            options and risks were discussed with the patient.                            All questions were answered, and informed consent                            was obtained. Prior Anticoagulants: The patient                            last took aspirin 1 day prior to the procedure. ASA                            Grade Assessment: II - A patient with mild systemic                            disease. After reviewing the risks and benefits,                            the patient was deemed in satisfactory condition to                            undergo the procedure.                           After obtaining informed consent, the colonoscope  was passed under direct vision. Throughout the                            procedure, the patient's blood pressure, pulse, and                            oxygen saturations were monitored continuously. The                            Colonoscope was introduced through the anus and                            advanced to the the cecum, identified by                            appendiceal  orifice and ileocecal valve. The                            colonoscopy was performed without difficulty. The                            quality of the bowel preparation was good. The                            bowel preparation used was Miralax. The ileocecal                            valve, appendiceal orifice, and rectum were                            photographed. Scope In: 9:34:47 AM Scope Out: 9:51:24 AM Scope Withdrawal Time: 0 hours 13 minutes 34 seconds  Total Procedure Duration: 0 hours 16 minutes 37 seconds  Findings:                 The perianal and digital rectal examinations were                            normal. Pertinent negatives include normal prostate                            (size, shape, and consistency).                           Three sessile polyps were found in the descending                            colon and transverse colon. The polyps were                            diminutive in size. These polyps were removed with                            a cold biopsy forceps. Resection and retrieval were  complete. Verification of patient identification                            for the specimen was done. Estimated blood loss was                            minimal.                           The exam was otherwise without abnormality on                            direct and retroflexion views. Complications:            No immediate complications. Estimated Blood Loss:     Estimated blood loss was minimal. Impression:               - Three diminutive polyps in the descending colon                            and in the transverse colon, removed with a cold                            biopsy forceps. Resected and retrieved.                           - The examination was otherwise normal on direct                            and retroflexion views. Recommendation:           - Patient has a contact number available for                             emergencies. The signs and symptoms of potential                            delayed complications were discussed with the                            patient. Return to normal activities tomorrow.                            Written discharge instructions were provided to the                            patient.                           - Resume previous diet.                           - Continue present medications.                           - Repeat colonoscopy is recommended. The  colonoscopy date will be determined after pathology                            results from today's exam become available for                            review. Gatha Mayer, MD 10/18/2016 11:35:55 AM This report has been signed electronically.

## 2016-10-19 ENCOUNTER — Telehealth: Payer: Self-pay | Admitting: *Deleted

## 2016-10-19 NOTE — Telephone Encounter (Signed)
  Follow up Call-  Call back number 10/18/2016  Post procedure Call Back phone  # [phone]  Permission to leave phone message Yes  Some recent data might be hidden     Patient questions:  Left message to call if necessary.

## 2016-10-22 ENCOUNTER — Telehealth: Payer: Self-pay | Admitting: *Deleted

## 2016-10-22 NOTE — Telephone Encounter (Signed)
Left message on f/u call 

## 2016-10-29 ENCOUNTER — Encounter: Payer: Self-pay | Admitting: Internal Medicine

## 2016-10-29 DIAGNOSIS — Z86010 Personal history of colonic polyps: Secondary | ICD-10-CM

## 2016-10-29 HISTORY — DX: Personal history of colonic polyps: Z86.010

## 2016-10-29 NOTE — Progress Notes (Signed)
2 diminutive adenomas and benign mucosal polyp Colon recall 2023

## 2016-11-20 ENCOUNTER — Encounter: Payer: Self-pay | Admitting: Internal Medicine

## 2016-11-20 ENCOUNTER — Ambulatory Visit (INDEPENDENT_AMBULATORY_CARE_PROVIDER_SITE_OTHER): Payer: Medicare Other | Admitting: Internal Medicine

## 2016-11-20 DIAGNOSIS — T8454XD Infection and inflammatory reaction due to internal left knee prosthesis, subsequent encounter: Secondary | ICD-10-CM | POA: Diagnosis not present

## 2016-11-20 NOTE — Assessment & Plan Note (Signed)
His group B streptococcal prosthetic knee infection has been cured. He will stay off of antibiotics and follow-up in 6 months.

## 2016-11-20 NOTE — Progress Notes (Signed)
[facility] for Infectious Disease  Patient Active Problem List   Diagnosis Date Noted  . Infection of prosthetic left knee joint ([place]) 06/16/2013    Priority: High  . S/P revision of total knee 08/17/2013    Priority: Medium  . S/P left knee resection, abx spacer 06/15/2013    Priority: Medium  . Hx of adenomatous colonic polyps 10/29/2016  . Hyperlipidemia 09/20/2015  . DM (diabetes mellitus) ([place]) 06/16/2013  . HTN (hypertension) 06/16/2013  . OSA (obstructive sleep apnea) 06/16/2013  . Obese 04/30/2012    Patient's Medications  New Prescriptions   No medications on file  Previous Medications   ACETAMINOPHEN (TYLENOL) 325 MG TABLET    Take 2 tablets (650 mg total) by mouth every 6 (six) hours as needed for mild pain (or Fever >/= 101).   AMLODIPINE (NORVASC) 10 MG TABLET    Take 10 mg by mouth daily.   ASPIRIN EC 81 MG TABLET    Take 81 mg by mouth daily.   FUROSEMIDE (LASIX) 40 MG TABLET    Take 40 mg by mouth daily before breakfast.   GLIMEPIRIDE (AMARYL) 4 MG TABLET    Take 4 mg by mouth daily before breakfast.   JARDIANCE 25 MG TABS TABLET    Take 25 mg by mouth daily.   METFORMIN (GLUCOPHAGE) 500 MG TABLET    Take 1,000 mg by mouth 2 (two) times daily with a meal.    PRAVASTATIN (PRAVACHOL) 40 MG TABLET    Take 40 mg by mouth every evening.   RAMIPRIL (ALTACE) 10 MG CAPSULE    Take 10 mg by mouth daily.   TRULICITY 1.5 KN/3.9JQ SOPN    Inject 1.5 mg into the skin every Monday.  Modified Medications   No medications on file  Discontinued Medications   No medications on file    Subjective: Hunter Santos is in for his routine follow-up visit. He completed 8 months of antibiotic therapy for group B streptococcal left prosthetic knee infection 6 months ago. He is doing well and has had no increase pain, redness or swelling of his left knee.  Review of Systems: Review of Systems  Constitutional: Negative for chills, diaphoresis and fever.  Gastrointestinal:  Negative for abdominal pain, diarrhea, nausea and vomiting.  Musculoskeletal: Negative for joint pain.    Past Medical History:  Diagnosis Date  . Acute renal failure (ARF) ([place]) 09/11/2015  . Allergy   . Arthritis   . Candidal esophagitis ([place]) 09/26/2015  . Diabetes mellitus   . History of transfusion   . Hx of adenomatous colonic polyps 10/29/2016  . Hyperlipidemia   . Hypertension   . Septic shock ([place]) 09/11/2015  . Sleep apnea    had surgery for deviated septum previously  . Streptococcal bacteremia 09/12/2015  . UGIB (upper gastrointestinal bleed)   . Ulcerative esophagitis     Social History  Substance Use Topics  . Smoking status: Never Smoker  . Smokeless tobacco: Never Used  . Alcohol use No    Family History  Problem Relation Age of Onset  . Cancer Father     Lung  . Colon cancer Neg Hx   . Esophageal cancer Neg Hx   . Rectal cancer Neg Hx   . Stomach cancer Neg Hx     No Known Allergies  Objective: Vitals:   11/20/16 0848  BP: 131/76  Pulse: 97  Temp: 98.7 F (37.1 C)  TempSrc: Oral  Weight: 246  lb (111.6 kg)  Height: 6\' 1"  (1.854 m)   Body mass index is 32.46 kg/m.  Physical Exam  Constitutional: He is oriented to person, place, and time.  He is in very good spirits.  Musculoskeletal: Normal range of motion. He exhibits no tenderness.  He has stable, diffuse swelling of his left knee without any redness or unusual warmth. He has good range of motion.  Neurological: He is alert and oriented to person, place, and time. Gait normal.  Skin: No rash noted.  Psychiatric: Mood and affect normal.    Lab Results    Problem List Items Addressed This Visit      High   Infection of prosthetic left knee joint ([place])    His group B streptococcal prosthetic knee infection has been cured. He will stay off of antibiotics and follow-up in 6 months.          Hunter Bickers, MD [facility] for [place]  [facility] [phone] pager   [phone] cell 11/20/2016, 9:02 AM

## 2017-02-28 DIAGNOSIS — E119 Type 2 diabetes mellitus without complications: Secondary | ICD-10-CM | POA: Diagnosis not present

## 2017-02-28 DIAGNOSIS — H2513 Age-related nuclear cataract, bilateral: Secondary | ICD-10-CM | POA: Diagnosis not present

## 2017-02-28 DIAGNOSIS — H40023 Open angle with borderline findings, high risk, bilateral: Secondary | ICD-10-CM | POA: Diagnosis not present

## 2017-03-19 DIAGNOSIS — I10 Essential (primary) hypertension: Secondary | ICD-10-CM | POA: Diagnosis not present

## 2017-03-19 DIAGNOSIS — E789 Disorder of lipoprotein metabolism, unspecified: Secondary | ICD-10-CM | POA: Diagnosis not present

## 2017-03-19 DIAGNOSIS — E118 Type 2 diabetes mellitus with unspecified complications: Secondary | ICD-10-CM | POA: Diagnosis not present

## 2017-03-27 DIAGNOSIS — D485 Neoplasm of uncertain behavior of skin: Secondary | ICD-10-CM | POA: Diagnosis not present

## 2017-03-27 DIAGNOSIS — L814 Other melanin hyperpigmentation: Secondary | ICD-10-CM | POA: Diagnosis not present

## 2017-03-27 DIAGNOSIS — D692 Other nonthrombocytopenic purpura: Secondary | ICD-10-CM | POA: Diagnosis not present

## 2017-03-27 DIAGNOSIS — L570 Actinic keratosis: Secondary | ICD-10-CM | POA: Diagnosis not present

## 2017-04-08 DIAGNOSIS — H40023 Open angle with borderline findings, high risk, bilateral: Secondary | ICD-10-CM | POA: Diagnosis not present

## 2017-04-09 DIAGNOSIS — N390 Urinary tract infection, site not specified: Secondary | ICD-10-CM | POA: Diagnosis not present

## 2017-04-09 DIAGNOSIS — E118 Type 2 diabetes mellitus with unspecified complications: Secondary | ICD-10-CM | POA: Diagnosis not present

## 2017-04-09 DIAGNOSIS — E789 Disorder of lipoprotein metabolism, unspecified: Secondary | ICD-10-CM | POA: Diagnosis not present

## 2017-04-16 DIAGNOSIS — I10 Essential (primary) hypertension: Secondary | ICD-10-CM | POA: Diagnosis not present

## 2017-04-16 DIAGNOSIS — E782 Mixed hyperlipidemia: Secondary | ICD-10-CM | POA: Diagnosis not present

## 2017-04-16 DIAGNOSIS — R319 Hematuria, unspecified: Secondary | ICD-10-CM | POA: Diagnosis not present

## 2017-04-16 DIAGNOSIS — N390 Urinary tract infection, site not specified: Secondary | ICD-10-CM | POA: Diagnosis not present

## 2017-04-16 DIAGNOSIS — E118 Type 2 diabetes mellitus with unspecified complications: Secondary | ICD-10-CM | POA: Diagnosis not present

## 2017-05-06 DIAGNOSIS — R350 Frequency of micturition: Secondary | ICD-10-CM | POA: Diagnosis not present

## 2017-05-06 DIAGNOSIS — R3121 Asymptomatic microscopic hematuria: Secondary | ICD-10-CM | POA: Diagnosis not present

## 2017-05-06 DIAGNOSIS — R351 Nocturia: Secondary | ICD-10-CM | POA: Diagnosis not present

## 2017-05-20 DIAGNOSIS — N200 Calculus of kidney: Secondary | ICD-10-CM | POA: Diagnosis not present

## 2017-05-20 DIAGNOSIS — R3121 Asymptomatic microscopic hematuria: Secondary | ICD-10-CM | POA: Diagnosis not present

## 2017-05-22 DIAGNOSIS — R350 Frequency of micturition: Secondary | ICD-10-CM | POA: Diagnosis not present

## 2017-05-22 DIAGNOSIS — R3121 Asymptomatic microscopic hematuria: Secondary | ICD-10-CM | POA: Diagnosis not present

## 2017-05-28 ENCOUNTER — Ambulatory Visit (INDEPENDENT_AMBULATORY_CARE_PROVIDER_SITE_OTHER): Payer: Medicare Other | Admitting: Internal Medicine

## 2017-05-28 DIAGNOSIS — T8454XD Infection and inflammatory reaction due to internal left knee prosthesis, subsequent encounter: Secondary | ICD-10-CM | POA: Diagnosis present

## 2017-05-28 NOTE — Progress Notes (Signed)
[facility] for Infectious Disease  Patient Active Problem List   Diagnosis Date Noted  . Infection of prosthetic left knee joint ([place]) 06/16/2013    Priority: High  . S/P revision of total knee 08/17/2013    Priority: Medium  . S/P left knee resection, abx spacer 06/15/2013    Priority: Medium  . Hx of adenomatous colonic polyps 10/29/2016  . Hyperlipidemia 09/20/2015  . DM (diabetes mellitus) ([place]) 06/16/2013  . HTN (hypertension) 06/16/2013  . OSA (obstructive sleep apnea) 06/16/2013  . Obese 04/30/2012      Medication List        Accurate as of 05/28/17  9:14 AM. Always use your most recent med list.          acetaminophen 325 MG tablet Commonly known as:  TYLENOL Take 2 tablets (650 mg total) by mouth every 6 (six) hours as needed for mild pain (or Fever >/= 101).   amLODipine 10 MG tablet Commonly known as:  NORVASC   aspirin EC 81 MG tablet   furosemide 40 MG tablet Commonly known as:  LASIX   glimepiride 4 MG tablet Commonly known as:  AMARYL   JARDIANCE 25 MG Tabs tablet Generic drug:  empagliflozin   metFORMIN 500 MG tablet Commonly known as:  GLUCOPHAGE   pravastatin 40 MG tablet Commonly known as:  PRAVACHOL   ramipril 10 MG capsule Commonly known as:  ALTACE   TRULICITY 1.5 ON/6.2XB Sopn Generic drug:  Dulaglutide       Subjective: J is in for his routine follow-up visit. He completed 8 months of antibiotic therapy one year ago for his group B streptococcal left prosthetic knee infection. He has been doing well and has not had any signs or symptoms to suggest recurrent infection. He has a little bit of discomfort in his left knee but is able to do all of his usual activities.  Review of Systems: Review of Systems  Constitutional: Negative for chills, diaphoresis and fever.  Gastrointestinal: Negative for abdominal pain, diarrhea, nausea and vomiting.  Musculoskeletal: Positive for joint pain.    Past Medical History:   Diagnosis Date  . Acute renal failure (ARF) ([place]) 09/11/2015  . Allergy   . Arthritis   . Candidal esophagitis ([place]) 09/26/2015  . Diabetes mellitus   . History of transfusion   . Hx of adenomatous colonic polyps 10/29/2016  . Hyperlipidemia   . Hypertension   . Septic shock ([employer]) 09/11/2015  . Sleep apnea    had surgery for deviated septum previously  . Streptococcal bacteremia 09/12/2015  . UGIB (upper gastrointestinal bleed)   . Ulcerative esophagitis     Social History   Tobacco Use  . Smoking status: Never Smoker  . Smokeless tobacco: Never Used  Substance Use Topics  . Alcohol use: No  . Drug use: No    Family History  Problem Relation Age of Onset  . Cancer Father        Lung  . Colon cancer Neg Hx   . Esophageal cancer Neg Hx   . Rectal cancer Neg Hx   . Stomach cancer Neg Hx     No Known Allergies  Objective: Vitals:   05/28/17 0906  BP: 139/77  Pulse: 86  Temp: 97.7 F (36.5 C)  TempSrc: Oral  Weight: 262 lb (118.8 kg)   Body mass index is 34.57 kg/m.  Physical Exam  Constitutional: He is oriented to person, place, and time.  He is in good spirits as usual.  Musculoskeletal:  He has a healed incision over his left knee. He has mild diffuse swelling with slight increased warmth. There is no redness. He has good range of motion.  Neurological: He is alert and oriented to person, place, and time.  Skin: No rash noted.  Psychiatric: Mood and affect normal.    Lab Results    Problem List Items Addressed This Visit    None       [name], MD [facility] for [place] [phone] pager   [phone] cell 05/28/2017, 9:14 AM

## 2017-05-28 NOTE — Assessment & Plan Note (Signed)
His prosthetic joint infection has been cured. He will remain off of antibiotics and follow-up here as needed.

## 2017-06-13 DIAGNOSIS — R350 Frequency of micturition: Secondary | ICD-10-CM | POA: Diagnosis not present

## 2017-06-13 DIAGNOSIS — R3121 Asymptomatic microscopic hematuria: Secondary | ICD-10-CM | POA: Diagnosis not present

## 2017-06-13 DIAGNOSIS — R8271 Bacteriuria: Secondary | ICD-10-CM | POA: Diagnosis not present

## 2017-06-13 DIAGNOSIS — R351 Nocturia: Secondary | ICD-10-CM | POA: Diagnosis not present

## 2017-07-23 DIAGNOSIS — E118 Type 2 diabetes mellitus with unspecified complications: Secondary | ICD-10-CM | POA: Diagnosis not present

## 2017-07-23 DIAGNOSIS — I10 Essential (primary) hypertension: Secondary | ICD-10-CM | POA: Diagnosis not present

## 2017-07-23 DIAGNOSIS — N390 Urinary tract infection, site not specified: Secondary | ICD-10-CM | POA: Diagnosis not present

## 2017-07-25 DIAGNOSIS — R350 Frequency of micturition: Secondary | ICD-10-CM | POA: Diagnosis not present

## 2017-07-25 DIAGNOSIS — N401 Enlarged prostate with lower urinary tract symptoms: Secondary | ICD-10-CM | POA: Diagnosis not present

## 2017-07-25 DIAGNOSIS — R3121 Asymptomatic microscopic hematuria: Secondary | ICD-10-CM | POA: Diagnosis not present

## 2017-07-25 DIAGNOSIS — R351 Nocturia: Secondary | ICD-10-CM | POA: Diagnosis not present

## 2017-08-07 DIAGNOSIS — E782 Mixed hyperlipidemia: Secondary | ICD-10-CM | POA: Diagnosis not present

## 2017-08-07 DIAGNOSIS — E118 Type 2 diabetes mellitus with unspecified complications: Secondary | ICD-10-CM | POA: Diagnosis not present

## 2017-08-07 DIAGNOSIS — I10 Essential (primary) hypertension: Secondary | ICD-10-CM | POA: Diagnosis not present

## 2017-08-16 ENCOUNTER — Other Ambulatory Visit: Payer: Self-pay | Admitting: Urology

## 2017-08-16 ENCOUNTER — Encounter (HOSPITAL_BASED_OUTPATIENT_CLINIC_OR_DEPARTMENT_OTHER): Payer: Self-pay

## 2017-08-16 ENCOUNTER — Other Ambulatory Visit: Payer: Self-pay

## 2017-08-16 DIAGNOSIS — N401 Enlarged prostate with lower urinary tract symptoms: Secondary | ICD-10-CM | POA: Diagnosis not present

## 2017-08-16 DIAGNOSIS — R350 Frequency of micturition: Secondary | ICD-10-CM | POA: Diagnosis not present

## 2017-08-16 DIAGNOSIS — D414 Neoplasm of uncertain behavior of bladder: Secondary | ICD-10-CM | POA: Diagnosis not present

## 2017-08-16 DIAGNOSIS — R3121 Asymptomatic microscopic hematuria: Secondary | ICD-10-CM | POA: Diagnosis not present

## 2017-08-16 NOTE — Progress Notes (Signed)
Spoke with:  Ulice Dash NPO:  After Midnight, no gum, candy, or mints   Arrival time: 10:00AM Labs:  Istat 8, PCR, EKG AM medications: Amlodipine Pre op orders:  no Ride home:  Colletta [place] (wife) [phone]

## 2017-08-16 NOTE — Pre-Procedure Instructions (Signed)
Spoke with Coni in reference to Mr. [place] concern if he needed a pre op antibiotic because of his significant history of infection with his knee prosthesis.  Coni will discuss with Dr. Lovena Neighbours.

## 2017-08-22 NOTE — H&P (Signed)
Urology Preoperative H&P   Chief Complaint: Bladder lesion  History of Present Illness: Hunter Santos is a 67 y.o. male  recently evaluated by Dr. Matilde Sprang for microscopic hematuria and a possible fungal UTI (treated with a course of Diflucan). He had a cystoscopy performed on 11/9 that was concerning for a possible anterolateral bladder lesion and diffuse cystitis. Bilobar prostatic obstruction also noted on cysto.   He had a repeat cysto on 08/15/17 that confirmed a persistent left anterolateral lesion involving the bladder mucosa, despite treatment of his cystitis.  He is here today for cystoscopy with bladder biopsy/resection of the bladder lesion in question.     Past Medical History:  Diagnosis Date  . Acute renal failure (ARF) ([place]) 09/11/2015  . Allergy   . Anemia    history of  . Arthritis    Left knee  . Candidal esophagitis ([place]) 09/26/2015  . Diabetes mellitus   . History of kidney stones   . History of transfusion   . Hx of adenomatous colonic polyps 10/29/2016  . Hyperlipidemia   . Hypertension   . Infection of prosthetic left knee joint ([place]) 06/2013  . Obese   . RBBB 09/22/2015   noted on EKG  . Septic shock ([place]) 09/11/2015  . Sleep apnea    had surgery for deviated septum previously  . Streptococcal bacteremia 09/12/2015  . UGIB (upper gastrointestinal bleed)   . Ulcerative esophagitis     Past Surgical History:  Procedure Laterality Date  . COLONOSCOPY  10/2016  . ESOPHAGOGASTRODUODENOSCOPY N/A 09/22/2015   Procedure: ESOPHAGOGASTRODUODENOSCOPY (EGD);  Surgeon: Gatha Mayer, MD;  Location: [facility];  Service: Endoscopy;  Laterality: N/A;  do in ICU  . EXCISIONAL TOTAL KNEE ARTHROPLASTY Left 06/15/2013   Procedure: LEFT RESECTION TOTAL KNEE ARTHROPLASTY WITH PLACEMENT OF ANTIBIOTIC SPACERS;  Surgeon: Mauri Pole, MD;  Location: [facility];  Service: Orthopedics;  Laterality: Left;  . I&D KNEE WITH POLY EXCHANGE Left 09/12/2015   Procedure: IRRIGATION  AND DEBRIDEMENT KNEE WITH POLY EXCHANGE LEFT TOTAL KNEE;  Surgeon: Paralee Cancel, MD;  Location: [facility];  Service: Orthopedics;  Laterality: Left;  . left ankle  2006  . NASAL SEPTUM SURGERY  2011  . TONSILLECTOMY     as child  . TOTAL KNEE ARTHROPLASTY  04/29/2012   Procedure: TOTAL KNEE ARTHROPLASTY;  Surgeon: Mauri Pole, MD;  Location: [facility];  Service: Orthopedics;  Laterality: Left;  . TOTAL KNEE REVISION Left 08/17/2013   Procedure: REIMPLANTATION LEFT TOTAL KNEE ARTHROPLASTY WITH REMOVAL OF ANTIBIOTIC SPACER ;  Surgeon: Mauri Pole, MD;  Location: [facility];  Service: Orthopedics;  Laterality: Left;    Allergies: No Known Allergies  Family History  Problem Relation Age of Onset  . Cancer Father        Lung  . Colon cancer Neg Hx   . Esophageal cancer Neg Hx   . Rectal cancer Neg Hx   . Stomach cancer Neg Hx     Social History:  reports that  has never smoked. he has never used smokeless tobacco. He reports that he drinks alcohol. He reports that he does not use drugs.  ROS: A complete review of systems was performed.  All systems are negative except for pertinent findings as noted.  Physical Exam:  Vital signs in last 24 hours:   Constitutional:  Alert and oriented, No acute distress Cardiovascular: Regular rate and rhythm, No JVD Respiratory: Normal respiratory effort, Lungs clear bilaterally GI: Abdomen is soft,  nontender, nondistended, no abdominal masses GU: No CVA tenderness Lymphatic: No lymphadenopathy Neurologic: Grossly intact, no focal deficits Psychiatric: Normal mood and affect  Laboratory Data:  No results for input(s): WBC, HGB, HCT, PLT in the last 72 hours.  No results for input(s): NA, K, CL, GLUCOSE, BUN, CALCIUM, CREATININE in the last 72 hours.  Invalid input(s): CO3   No results found for this or any previous visit (from the past 24 hour(s)). No results found for this or any previous visit (from the past 240 hour(s)).  Renal  Function: No results for input(s): CREATININE in the last 168 hours. CrCl cannot be calculated (Patient's most recent lab result is older than the maximum 21 days allowed.).  Radiologic Imaging: No results found.  I independently reviewed the above imaging studies.  Assessment and Plan Hunter Santos is a 67 y.o. male with a bladder lesion concerning for malignancy  -The risks, benefits and alternatives of cystoscopy with TURBT was discussed with the patient. The risks included, but are not limited to, bleeding, urinary tract infection, bladder perforation requiring prolonged catheterization and/or open bladder repair, ureteral obstruction, voiding dysfunction and the inherent risks of general anesthesia. The patient voices understanding and wishes to proceed.    Ellison Hughs, MD 08/22/2017, 5:56 PM  [facility] Pager: [phone]

## 2017-08-23 ENCOUNTER — Ambulatory Visit (HOSPITAL_BASED_OUTPATIENT_CLINIC_OR_DEPARTMENT_OTHER): Payer: Medicare Other | Admitting: Anesthesiology

## 2017-08-23 ENCOUNTER — Other Ambulatory Visit: Payer: Self-pay

## 2017-08-23 ENCOUNTER — Encounter (HOSPITAL_BASED_OUTPATIENT_CLINIC_OR_DEPARTMENT_OTHER)
Admission: RE | Disposition: A | Discharge status: Home / Self Care | Payer: Self-pay | Source: Ambulatory Visit | Attending: Urology

## 2017-08-23 ENCOUNTER — Ambulatory Visit (HOSPITAL_BASED_OUTPATIENT_CLINIC_OR_DEPARTMENT_OTHER)
Admission: RE | Admit: 2017-08-23 | Discharge: 2017-08-23 | Disposition: A | Discharge status: Home / Self Care | Payer: Medicare Other | Source: Ambulatory Visit | Attending: Urology | Admitting: Urology

## 2017-08-23 ENCOUNTER — Encounter (HOSPITAL_BASED_OUTPATIENT_CLINIC_OR_DEPARTMENT_OTHER): Payer: Self-pay | Admitting: *Deleted

## 2017-08-23 DIAGNOSIS — E785 Hyperlipidemia, unspecified: Secondary | ICD-10-CM | POA: Insufficient documentation

## 2017-08-23 DIAGNOSIS — Z7982 Long term (current) use of aspirin: Secondary | ICD-10-CM | POA: Diagnosis not present

## 2017-08-23 DIAGNOSIS — Z79899 Other long term (current) drug therapy: Secondary | ICD-10-CM | POA: Insufficient documentation

## 2017-08-23 DIAGNOSIS — N3289 Other specified disorders of bladder: Secondary | ICD-10-CM | POA: Diagnosis not present

## 2017-08-23 DIAGNOSIS — N3020 Other chronic cystitis without hematuria: Secondary | ICD-10-CM | POA: Insufficient documentation

## 2017-08-23 DIAGNOSIS — D414 Neoplasm of uncertain behavior of bladder: Secondary | ICD-10-CM | POA: Insufficient documentation

## 2017-08-23 DIAGNOSIS — I10 Essential (primary) hypertension: Secondary | ICD-10-CM | POA: Diagnosis not present

## 2017-08-23 DIAGNOSIS — I4510 Unspecified right bundle-branch block: Secondary | ICD-10-CM | POA: Insufficient documentation

## 2017-08-23 DIAGNOSIS — G4730 Sleep apnea, unspecified: Secondary | ICD-10-CM | POA: Insufficient documentation

## 2017-08-23 DIAGNOSIS — Z6833 Body mass index (BMI) 33.0-33.9, adult: Secondary | ICD-10-CM | POA: Insufficient documentation

## 2017-08-23 DIAGNOSIS — Z8711 Personal history of peptic ulcer disease: Secondary | ICD-10-CM | POA: Diagnosis not present

## 2017-08-23 DIAGNOSIS — Z96652 Presence of left artificial knee joint: Secondary | ICD-10-CM | POA: Diagnosis not present

## 2017-08-23 DIAGNOSIS — Z86010 Personal history of colonic polyps: Secondary | ICD-10-CM | POA: Diagnosis not present

## 2017-08-23 DIAGNOSIS — E669 Obesity, unspecified: Secondary | ICD-10-CM | POA: Diagnosis not present

## 2017-08-23 DIAGNOSIS — Z8719 Personal history of other diseases of the digestive system: Secondary | ICD-10-CM | POA: Diagnosis not present

## 2017-08-23 DIAGNOSIS — N329 Bladder disorder, unspecified: Secondary | ICD-10-CM | POA: Diagnosis not present

## 2017-08-23 DIAGNOSIS — E119 Type 2 diabetes mellitus without complications: Secondary | ICD-10-CM | POA: Insufficient documentation

## 2017-08-23 DIAGNOSIS — Z7984 Long term (current) use of oral hypoglycemic drugs: Secondary | ICD-10-CM | POA: Diagnosis not present

## 2017-08-23 HISTORY — DX: Unspecified right bundle-branch block: I45.10

## 2017-08-23 HISTORY — DX: Infection and inflammatory reaction due to internal left knee prosthesis, initial encounter: T84.54XA

## 2017-08-23 HISTORY — DX: Personal history of urinary calculi: Z87.442

## 2017-08-23 HISTORY — DX: Anemia, unspecified: D64.9

## 2017-08-23 HISTORY — DX: Obesity, unspecified: E66.9

## 2017-08-23 HISTORY — PX: CYSTOSCOPY: SHX5120

## 2017-08-23 HISTORY — PX: TRANSURETHRAL RESECTION OF BLADDER TUMOR: SHX2575

## 2017-08-23 LAB — GLUCOSE, CAPILLARY: GLUCOSE-CAPILLARY: 160 mg/dL — AB (ref 65–99)

## 2017-08-23 LAB — SURGICAL PCR SCREEN
MRSA, PCR: NEGATIVE
Staphylococcus aureus: NEGATIVE

## 2017-08-23 LAB — POCT I-STAT, CHEM 8
BUN: 16 mg/dL (ref 6–20)
CREATININE: 0.8 mg/dL (ref 0.61–1.24)
Calcium, Ion: 1.22 mmol/L (ref 1.15–1.40)
Chloride: 100 mmol/L — ABNORMAL LOW (ref 101–111)
Glucose, Bld: 177 mg/dL — ABNORMAL HIGH (ref 65–99)
HEMATOCRIT: 43 % (ref 39.0–52.0)
HEMOGLOBIN: 14.6 g/dL (ref 13.0–17.0)
Potassium: 4.2 mmol/L (ref 3.5–5.1)
Sodium: 138 mmol/L (ref 135–145)
TCO2: 24 mmol/L (ref 22–32)

## 2017-08-23 SURGERY — TURBT (TRANSURETHRAL RESECTION OF BLADDER TUMOR)
Anesthesia: General

## 2017-08-23 MED ORDER — ONDANSETRON HCL 4 MG/2ML IJ SOLN
4.0000 mg | Freq: Once | INTRAMUSCULAR | Status: DC | PRN
  Filled 2017-08-23: qty 2

## 2017-08-23 MED ORDER — ONDANSETRON HCL 4 MG/2ML IJ SOLN
INTRAMUSCULAR | Status: DC | PRN
  Administered 2017-08-23: 4 mg via INTRAVENOUS

## 2017-08-23 MED ORDER — PHENAZOPYRIDINE HCL 200 MG PO TABS: 200.0000 mg | ORAL_TABLET | Freq: Three times a day (TID) | ORAL | 0 refills | Status: AC | PRN

## 2017-08-23 MED ORDER — SUGAMMADEX SODIUM 200 MG/2ML IV SOLN
INTRAVENOUS | Status: DC | PRN
  Administered 2017-08-23: 230 mg via INTRAVENOUS

## 2017-08-23 MED ORDER — OXYCODONE HCL 5 MG PO TABS
ORAL_TABLET | ORAL | Status: AC
  Filled 2017-08-23: qty 1

## 2017-08-23 MED ORDER — PROPOFOL 10 MG/ML IV BOLUS
INTRAVENOUS | Status: DC | PRN
  Administered 2017-08-23: 200 mg via INTRAVENOUS

## 2017-08-23 MED ORDER — HYDROCODONE-ACETAMINOPHEN 5-325 MG PO TABS: 1.0000 | ORAL_TABLET | ORAL | 0 refills | Status: AC | PRN

## 2017-08-23 MED ORDER — DEXAMETHASONE SODIUM PHOSPHATE 10 MG/ML IJ SOLN
INTRAMUSCULAR | Status: DC | PRN
  Administered 2017-08-23: 5 mg via INTRAVENOUS

## 2017-08-23 MED ORDER — FENTANYL CITRATE (PF) 100 MCG/2ML IJ SOLN
INTRAMUSCULAR | Status: DC | PRN
  Administered 2017-08-23: 25 ug via INTRAVENOUS
  Administered 2017-08-23: 50 ug via INTRAVENOUS
  Administered 2017-08-23: 25 ug via INTRAVENOUS

## 2017-08-23 MED ORDER — LIDOCAINE 2% (20 MG/ML) 5 ML SYRINGE
INTRAMUSCULAR | Status: AC
  Filled 2017-08-23: qty 5

## 2017-08-23 MED ORDER — ROCURONIUM BROMIDE 10 MG/ML (PF) SYRINGE
PREFILLED_SYRINGE | INTRAVENOUS | Status: DC | PRN
  Administered 2017-08-23: 30 mg via INTRAVENOUS

## 2017-08-23 MED ORDER — LIDOCAINE 2% (20 MG/ML) 5 ML SYRINGE
INTRAMUSCULAR | Status: DC | PRN
  Administered 2017-08-23: 80 mg via INTRAVENOUS

## 2017-08-23 MED ORDER — OXYCODONE HCL 5 MG/5ML PO SOLN
5.0000 mg | Freq: Once | ORAL | Status: AC | PRN
  Filled 2017-08-23: qty 5

## 2017-08-23 MED ORDER — DEXAMETHASONE SODIUM PHOSPHATE 10 MG/ML IJ SOLN
INTRAMUSCULAR | Status: AC
  Filled 2017-08-23: qty 1

## 2017-08-23 MED ORDER — PHENAZOPYRIDINE HCL 100 MG PO TABS
ORAL_TABLET | ORAL | Status: AC
  Filled 2017-08-23: qty 2

## 2017-08-23 MED ORDER — PHENAZOPYRIDINE HCL 200 MG PO TABS
200.0000 mg | ORAL_TABLET | Freq: Once | ORAL | Status: AC
  Administered 2017-08-23: 200 mg via ORAL
  Filled 2017-08-23: qty 1

## 2017-08-23 MED ORDER — SODIUM CHLORIDE 0.9 % IR SOLN
Status: DC | PRN
  Administered 2017-08-23 (×4): 3000 mL via INTRAVESICAL

## 2017-08-23 MED ORDER — OXYCODONE HCL 5 MG PO TABS
5.0000 mg | ORAL_TABLET | Freq: Once | ORAL | Status: AC | PRN
  Administered 2017-08-23: 5 mg via ORAL
  Filled 2017-08-23: qty 1

## 2017-08-23 MED ORDER — SUGAMMADEX SODIUM 200 MG/2ML IV SOLN
INTRAVENOUS | Status: AC
  Filled 2017-08-23: qty 2

## 2017-08-23 MED ORDER — FENTANYL CITRATE (PF) 100 MCG/2ML IJ SOLN
INTRAMUSCULAR | Status: AC
  Filled 2017-08-23: qty 2

## 2017-08-23 MED ORDER — ONDANSETRON HCL 4 MG PO TABS: 4.0000 mg | ORAL_TABLET | Freq: Every day | ORAL | 1 refills | Status: AC | PRN

## 2017-08-23 MED ORDER — CIPROFLOXACIN IN D5W 400 MG/200ML IV SOLN
400.0000 mg | Freq: Once | INTRAVENOUS | Status: AC
  Administered 2017-08-23: 400 mg via INTRAVENOUS
  Filled 2017-08-23: qty 200

## 2017-08-23 MED ORDER — CIPROFLOXACIN IN D5W 400 MG/200ML IV SOLN
INTRAVENOUS | Status: AC
  Filled 2017-08-23: qty 200

## 2017-08-23 MED ORDER — MIDAZOLAM HCL 2 MG/2ML IJ SOLN
INTRAMUSCULAR | Status: AC
  Filled 2017-08-23: qty 2

## 2017-08-23 MED ORDER — MIDAZOLAM HCL 2 MG/2ML IJ SOLN
INTRAMUSCULAR | Status: DC | PRN
  Administered 2017-08-23: 2 mg via INTRAVENOUS

## 2017-08-23 MED ORDER — LACTATED RINGERS IV SOLN
INTRAVENOUS | Status: DC
  Administered 2017-08-23: 11:00:00 via INTRAVENOUS
  Filled 2017-08-23: qty 1000

## 2017-08-23 MED ORDER — FENTANYL CITRATE (PF) 100 MCG/2ML IJ SOLN
25.0000 ug | INTRAMUSCULAR | Status: DC | PRN
  Filled 2017-08-23: qty 1

## 2017-08-23 MED ORDER — ONDANSETRON HCL 4 MG/2ML IJ SOLN
INTRAMUSCULAR | Status: AC
  Filled 2017-08-23: qty 2

## 2017-08-23 MED ORDER — PROPOFOL 10 MG/ML IV BOLUS
INTRAVENOUS | Status: AC
  Filled 2017-08-23: qty 20

## 2017-08-23 MED ORDER — CIPROFLOXACIN HCL 500 MG PO TABS: 500.0000 mg | ORAL_TABLET | Freq: Two times a day (BID) | ORAL | 0 refills | Status: AC

## 2017-08-23 SURGICAL SUPPLY — 17 items
BAG DRAIN URO-CYSTO SKYTR STRL (DRAIN) ×6 IMPLANT
BAG DRN UROCATH (DRAIN) ×2
BAG URINE DRAINAGE (UROLOGICAL SUPPLIES) ×2 IMPLANT
BAG URINE LEG 500ML (DRAIN) ×2 IMPLANT
GLOVE BIO SURGEON STRL SZ7.5 (GLOVE) ×3 IMPLANT
GOWN STRL REUS W/TWL XL LVL3 (GOWN DISPOSABLE) ×3 IMPLANT
HOLDER FOLEY CATH W/STRAP (MISCELLANEOUS) ×2 IMPLANT
IV NS IRRIG 3000ML ARTHROMATIC (IV SOLUTION) ×16 IMPLANT
LOOP CUT BIPOLAR 24F LRG (ELECTROSURGICAL) ×2 IMPLANT
MANIFOLD NEPTUNE II (INSTRUMENTS) ×3 IMPLANT
NS IRRIG 500ML POUR BTL (IV SOLUTION) IMPLANT
PACK CYSTO (CUSTOM PROCEDURE TRAY) ×3 IMPLANT
SYRINGE IRR TOOMEY STRL 70CC (SYRINGE) ×2 IMPLANT
TUBE CONNECTING 12'X1/4 (SUCTIONS) ×1
TUBE CONNECTING 12X1/4 (SUCTIONS) ×2 IMPLANT
WATER STERILE IRR 3000ML UROMA (IV SOLUTION) ×2 IMPLANT
WATER STERILE IRR 500ML POUR (IV SOLUTION) ×2 IMPLANT

## 2017-08-23 NOTE — Transfer of Care (Signed)
Immediate Anesthesia Transfer of Care Note  Patient: Hunter Santos  Procedure(s) Performed: Procedure(s) (LRB): TRANSURETHRAL RESECTION OF BLADDER TUMOR (TURBT) (N/A) CYSTOSCOPY (N/A)  Patient Location: PACU  Anesthesia Type: General  Level of Consciousness: awake, oriented, sedated and patient cooperative  Airway & Oxygen Therapy: Patient Spontanous Breathing and Patient connected to face mask oxygen  Post-op Assessment: Report given to PACU RN and Post -op Vital signs reviewed and stable  Post vital signs: Reviewed and stable  Complications: No apparent anesthesia complications  Last Vitals:  Vitals:   08/23/17 0944 08/23/17 1344  BP: (!) 145/80 139/78  Pulse: 90 89  Resp: 16 13  Temp: 36.6 C 36.6 C  SpO2: 100% 95%    Last Pain:  Vitals:   08/23/17 0944  TempSrc: Oral      Patients Stated Pain Goal: 2 (08/23/17 1028)

## 2017-08-23 NOTE — Anesthesia Preprocedure Evaluation (Addendum)
Anesthesia Evaluation  Patient identified by MRN, date of birth, ID band Patient awake    Reviewed: Allergy & Precautions, NPO status , Patient's Chart, lab work & pertinent test results  Airway Mallampati: II  TM Distance: >3 FB Neck ROM: Full    Dental  (+) Dental Advisory Given, Teeth Intact, Caps   Pulmonary sleep apnea ,    Pulmonary exam normal breath sounds clear to auscultation       Cardiovascular hypertension, Pt. on medications (-) Past MI Normal cardiovascular exam+ dysrhythmias  Rhythm:Regular Rate:Normal  RBBB, inf Q waves  TTE 2017 - EF was in the range of 55% to 60%. Grade 1 diastolic dysfunction. Mildly D-shaped interventricular septum may be suggestive of a degree of RV pressure/volume overload. Mildly dilated aortic root. Trivial MR. Left atrium was mildly dilated. RV cavity size was mildly dilated. Right atrium was moderately dilated.    Neuro/Psych negative neurological ROS  negative psych ROS   GI/Hepatic Neg liver ROS, PUD, Ulcerative esophagitis   Endo/Other  diabetes, Type 2, Oral Hypoglycemic AgentsObesity  Renal/GU Renal Insufficiency  negative genitourinary   Musculoskeletal  (+) Arthritis ,   Abdominal (+) + obese,   Peds  Hematology  (+) anemia ,   Anesthesia Other Findings   Reproductive/Obstetrics                            Anesthesia Physical  Anesthesia Plan  ASA: III  Anesthesia Plan: General   Post-op Pain Management:    Induction: Intravenous  PONV Risk Score and Plan: 3 and Treatment may vary due to age or medical condition, Midazolam, Ondansetron and Dexamethasone  Airway Management Planned: LMA and Oral ETT  Additional Equipment: None  Intra-op Plan:   Post-operative Plan: Extubation in OR  Informed Consent: I have reviewed the patients History and Physical, chart, labs and discussed the procedure including the risks, benefits and  alternatives for the proposed anesthesia with the patient or authorized representative who has indicated his/her understanding and acceptance.   Dental advisory given  Plan Discussed with: CRNA  Anesthesia Plan Comments: (LMA if no paralysis requested by surgeon, otherwise ETT)        Anesthesia Quick Evaluation                                   Anesthesia Evaluation  Patient identified by MRN, date of birth, ID band Patient awake    Reviewed: Allergy & Precautions, H&P , NPO status , Patient's Chart, lab work & pertinent test results  Airway Mallampati: III TM Distance: >3 FB Neck ROM: full    Dental no notable dental hx. (+) Teeth Intact and Dental Advisory Given   Pulmonary neg pulmonary ROS, sleep apnea ,  breath sounds clear to auscultation  Pulmonary exam normal       Cardiovascular Exercise Tolerance: Good hypertension, Pt. on medications negative cardio ROS  Rhythm:regular Rate:Normal  RBBB   Neuro/Psych negative neurological ROS  negative psych ROS   GI/Hepatic negative GI ROS, Neg liver ROS,   Endo/Other  negative endocrine ROSdiabetes, Well Controlled, Type 2, Oral Hypoglycemic Agents  Renal/GU negative Renal ROS  negative genitourinary   Musculoskeletal   Abdominal   Peds  Hematology negative hematology ROS (+) anemia ,   Anesthesia Other Findings   Reproductive/Obstetrics negative OB ROS  Anesthesia Physical Anesthesia Plan  ASA: III  Anesthesia Plan: Spinal   Post-op Pain Management:    Induction:   Airway Management Planned: Simple Face Mask  Additional Equipment:   Intra-op Plan:   Post-operative Plan:   Informed Consent: I have reviewed the patients History and Physical, chart, labs and discussed the procedure including the risks, benefits and alternatives for the proposed anesthesia with the patient or authorized representative who has indicated his/her  understanding and acceptance.   Dental Advisory Given  Plan Discussed with: CRNA and Surgeon  Anesthesia Plan Comments:         Anesthesia Quick Evaluation

## 2017-08-23 NOTE — Anesthesia Postprocedure Evaluation (Signed)
Anesthesia Post Note  Patient: Hunter Santos  Procedure(s) Performed: TRANSURETHRAL RESECTION OF BLADDER TUMOR (TURBT) (N/A ) CYSTOSCOPY (N/A )     Patient location during evaluation: PACU Anesthesia Type: General Level of consciousness: awake and alert Pain management: pain level controlled Vital Signs Assessment: post-procedure vital signs reviewed and stable Respiratory status: spontaneous breathing, nonlabored ventilation and respiratory function stable Cardiovascular status: blood pressure returned to baseline and stable Postop Assessment: no apparent nausea or vomiting Anesthetic complications: no    Last Vitals:  Vitals:   08/23/17 1400 08/23/17 1415  BP: 128/83 133/76  Pulse: 82 85  Resp: (!) 21 17  Temp:    SpO2: 99% 96%    Last Pain:  Vitals:   08/23/17 0944  TempSrc: Oral                 Audry Pili

## 2017-08-23 NOTE — Discharge Instructions (Signed)

## 2017-08-23 NOTE — Interval H&P Note (Signed)
History and Physical Interval Note:  08/23/2017 11:34 AM  Hunter Santos  has presented today for surgery, with the diagnosis of BLADDER LESION  The various methods of treatment have been discussed with the patient and family. After consideration of risks, benefits and other options for treatment, the patient has consented to  Procedure(s) with comments: TRANSURETHRAL RESECTION OF BLADDER TUMOR (TURBT) (N/A) - ONLY NEEDS 45 MIN FOR ALL PROCEDURES CYSTOSCOPY (N/A) as a surgical intervention .  The patient's history has been reviewed, patient examined, no change in status, stable for surgery.  I have reviewed the patient's chart and labs.  Questions were answered to the patient's satisfaction.     Conception Oms Winter

## 2017-08-23 NOTE — Anesthesia Procedure Notes (Signed)
Procedure Name: LMA Insertion Date/Time: 08/23/2017 12:36 PM Performed by: Suan Halter, CRNA Pre-anesthesia Checklist: Patient identified, Emergency Drugs available, Suction available and Patient being monitored Patient Re-evaluated:Patient Re-evaluated prior to induction Oxygen Delivery Method: Circle system utilized Preoxygenation: Pre-oxygenation with 100% oxygen Induction Type: IV induction Ventilation: Mask ventilation without difficulty LMA: LMA inserted LMA Size: 4.0 Number of attempts: 1 Airway Equipment and Method: Bite block Placement Confirmation: positive ETCO2 Tube secured with: Tape Dental Injury: Teeth and Oropharynx as per pre-operative assessment

## 2017-08-23 NOTE — Op Note (Signed)
Operative Note  Preoperative diagnosis:  1.  Left posterolateral bladder lesion concerning for malignancy (3 cm x 3 cm)  Postoperative diagnosis: 1.  Same  Procedure(s): 1.  Cystoscopy with TURBT (medium)  Surgeon: Ellison Hughs, MD  Assistants:  None  Anesthesia:  General LMA  Complications:  None  EBL:  20 mL   Specimens: 1. Left posterolateral bladder lesion  Drains/Catheters: 1.  20 French 2-way Foley catheter with 10 mL in the balloon  Intraoperative findings:  Erythematous, flat left posterolateral bladder lesion (3 cm x 3 cm)  Indication:  Hunter Santos is a 67 y.o. male recently evaluated by Dr. Matilde Sprang for microscopic hematuria and a possible fungal UTI (treated with a course of Diflucan). He had a cystoscopy performed on 11/9 that was concerning for a possible anterolateral bladder lesion and diffuse cystitis. Bilobar prostatic obstruction also noted on cysto.   He had a repeat cysto on 08/15/17 that confirmed a persistent left anterolateral lesion involving the bladder mucosa, despite treatment of his cystitis.  He is here today for cystoscopy with bladder biopsy/resection of the bladder lesion in question.    Description of procedure:  After informed consent was obtained, the patient was brought to the operating room and general LMA anesthesia was administered. The patient was then placed in the dorsolithotomy position and prepped and draped in usual sterile fashion. A timeout was performed. A 26 French resectoscope was then inserted into the urethral meatus and advanced into the bladder under direct vision. A complete bladder survey revealed a erythematous, flat posterior lateral bladder lesion measuring approximately 3 x 3 cm.  There were no other intravesical lesion seen on cystoscopy.  Using a bipolar loop, the lesion in question was resected down to the detrusor musculature, which was easily identifiable.  The area of resection was then extensively  fulgurated until hemostasis was achieved.  All of the resected tissue was then irrigated out of the bladder through the sheath of the resectoscope.  A 20 French 2-way Foley catheter was then inserted with return of clear to light pink irrigant.  10 mL of sterile water was used to inflate the catheter balloon, which was then placed to gravity drainage.  The patient tolerated the procedure well and was transferred to the postanesthesia unit in stable condition.  Plan: The patient has been instructed to remove his Foley catheter in the morning of 08/27/2017.  He will follow-up in my office on 09/06/2017 to discuss his pathology results.

## 2017-08-26 ENCOUNTER — Encounter (HOSPITAL_BASED_OUTPATIENT_CLINIC_OR_DEPARTMENT_OTHER): Payer: Self-pay | Admitting: Urology

## 2017-09-06 DIAGNOSIS — R3121 Asymptomatic microscopic hematuria: Secondary | ICD-10-CM | POA: Diagnosis not present

## 2017-09-06 DIAGNOSIS — N401 Enlarged prostate with lower urinary tract symptoms: Secondary | ICD-10-CM | POA: Diagnosis not present

## 2017-09-06 DIAGNOSIS — R351 Nocturia: Secondary | ICD-10-CM | POA: Diagnosis not present

## 2017-09-06 DIAGNOSIS — R350 Frequency of micturition: Secondary | ICD-10-CM | POA: Diagnosis not present

## 2017-09-18 DIAGNOSIS — Z794 Long term (current) use of insulin: Secondary | ICD-10-CM | POA: Diagnosis not present

## 2017-09-18 DIAGNOSIS — E118 Type 2 diabetes mellitus with unspecified complications: Secondary | ICD-10-CM | POA: Diagnosis not present

## 2017-09-18 DIAGNOSIS — E782 Mixed hyperlipidemia: Secondary | ICD-10-CM | POA: Diagnosis not present

## 2017-09-25 DIAGNOSIS — L570 Actinic keratosis: Secondary | ICD-10-CM | POA: Diagnosis not present

## 2017-09-25 DIAGNOSIS — D692 Other nonthrombocytopenic purpura: Secondary | ICD-10-CM | POA: Diagnosis not present

## 2017-09-25 DIAGNOSIS — L853 Xerosis cutis: Secondary | ICD-10-CM | POA: Diagnosis not present

## 2017-10-07 DIAGNOSIS — H40023 Open angle with borderline findings, high risk, bilateral: Secondary | ICD-10-CM | POA: Diagnosis not present

## 2017-10-10 DIAGNOSIS — E118 Type 2 diabetes mellitus with unspecified complications: Secondary | ICD-10-CM | POA: Diagnosis not present

## 2017-10-10 DIAGNOSIS — Z794 Long term (current) use of insulin: Secondary | ICD-10-CM | POA: Diagnosis not present

## 2017-12-25 DIAGNOSIS — E118 Type 2 diabetes mellitus with unspecified complications: Secondary | ICD-10-CM | POA: Diagnosis not present

## 2017-12-25 DIAGNOSIS — E789 Disorder of lipoprotein metabolism, unspecified: Secondary | ICD-10-CM | POA: Diagnosis not present

## 2017-12-25 DIAGNOSIS — Z794 Long term (current) use of insulin: Secondary | ICD-10-CM | POA: Diagnosis not present

## 2018-01-01 DIAGNOSIS — E789 Disorder of lipoprotein metabolism, unspecified: Secondary | ICD-10-CM | POA: Diagnosis not present

## 2018-01-01 DIAGNOSIS — I10 Essential (primary) hypertension: Secondary | ICD-10-CM | POA: Diagnosis not present

## 2018-01-01 DIAGNOSIS — Z794 Long term (current) use of insulin: Secondary | ICD-10-CM | POA: Diagnosis not present

## 2018-01-01 DIAGNOSIS — E1165 Type 2 diabetes mellitus with hyperglycemia: Secondary | ICD-10-CM | POA: Diagnosis not present

## 2018-03-31 DIAGNOSIS — D2371 Other benign neoplasm of skin of right lower limb, including hip: Secondary | ICD-10-CM | POA: Diagnosis not present

## 2018-03-31 DIAGNOSIS — L570 Actinic keratosis: Secondary | ICD-10-CM | POA: Diagnosis not present

## 2018-03-31 DIAGNOSIS — D034 Melanoma in situ of scalp and neck: Secondary | ICD-10-CM | POA: Diagnosis not present

## 2018-03-31 DIAGNOSIS — D485 Neoplasm of uncertain behavior of skin: Secondary | ICD-10-CM | POA: Diagnosis not present

## 2018-04-10 DIAGNOSIS — H40023 Open angle with borderline findings, high risk, bilateral: Secondary | ICD-10-CM | POA: Diagnosis not present

## 2018-04-10 DIAGNOSIS — E119 Type 2 diabetes mellitus without complications: Secondary | ICD-10-CM | POA: Diagnosis not present

## 2018-04-10 DIAGNOSIS — H2513 Age-related nuclear cataract, bilateral: Secondary | ICD-10-CM | POA: Diagnosis not present

## 2018-04-14 DIAGNOSIS — D034 Melanoma in situ of scalp and neck: Secondary | ICD-10-CM | POA: Diagnosis not present

## 2018-04-14 DIAGNOSIS — L988 Other specified disorders of the skin and subcutaneous tissue: Secondary | ICD-10-CM | POA: Diagnosis not present

## 2018-04-15 DIAGNOSIS — E789 Disorder of lipoprotein metabolism, unspecified: Secondary | ICD-10-CM | POA: Diagnosis not present

## 2018-04-15 DIAGNOSIS — E1165 Type 2 diabetes mellitus with hyperglycemia: Secondary | ICD-10-CM | POA: Diagnosis not present

## 2018-04-15 DIAGNOSIS — D034 Melanoma in situ of scalp and neck: Secondary | ICD-10-CM | POA: Diagnosis not present

## 2018-04-15 DIAGNOSIS — Z0000 Encounter for general adult medical examination without abnormal findings: Secondary | ICD-10-CM | POA: Diagnosis not present

## 2018-04-15 DIAGNOSIS — I10 Essential (primary) hypertension: Secondary | ICD-10-CM | POA: Diagnosis not present

## 2018-04-23 DIAGNOSIS — E782 Mixed hyperlipidemia: Secondary | ICD-10-CM | POA: Diagnosis not present

## 2018-04-23 DIAGNOSIS — I10 Essential (primary) hypertension: Secondary | ICD-10-CM | POA: Diagnosis not present

## 2018-04-23 DIAGNOSIS — Z794 Long term (current) use of insulin: Secondary | ICD-10-CM | POA: Diagnosis not present

## 2018-04-23 DIAGNOSIS — E1165 Type 2 diabetes mellitus with hyperglycemia: Secondary | ICD-10-CM | POA: Diagnosis not present

## 2018-04-23 DIAGNOSIS — Z23 Encounter for immunization: Secondary | ICD-10-CM | POA: Diagnosis not present

## 2018-04-23 DIAGNOSIS — N400 Enlarged prostate without lower urinary tract symptoms: Secondary | ICD-10-CM | POA: Diagnosis not present

## 2018-07-01 DIAGNOSIS — Z85820 Personal history of malignant melanoma of skin: Secondary | ICD-10-CM | POA: Diagnosis not present

## 2018-07-01 DIAGNOSIS — D485 Neoplasm of uncertain behavior of skin: Secondary | ICD-10-CM | POA: Diagnosis not present

## 2018-07-01 DIAGNOSIS — L820 Inflamed seborrheic keratosis: Secondary | ICD-10-CM | POA: Diagnosis not present

## 2018-07-01 DIAGNOSIS — L720 Epidermal cyst: Secondary | ICD-10-CM | POA: Diagnosis not present

## 2018-07-01 DIAGNOSIS — L814 Other melanin hyperpigmentation: Secondary | ICD-10-CM | POA: Diagnosis not present

## 2018-07-01 DIAGNOSIS — L570 Actinic keratosis: Secondary | ICD-10-CM | POA: Diagnosis not present

## 2018-09-17 DIAGNOSIS — I10 Essential (primary) hypertension: Secondary | ICD-10-CM | POA: Diagnosis not present

## 2018-09-17 DIAGNOSIS — E1165 Type 2 diabetes mellitus with hyperglycemia: Secondary | ICD-10-CM | POA: Diagnosis not present

## 2018-09-17 DIAGNOSIS — E782 Mixed hyperlipidemia: Secondary | ICD-10-CM | POA: Diagnosis not present

## 2018-09-24 DIAGNOSIS — E782 Mixed hyperlipidemia: Secondary | ICD-10-CM | POA: Diagnosis not present

## 2018-09-24 DIAGNOSIS — I10 Essential (primary) hypertension: Secondary | ICD-10-CM | POA: Diagnosis not present

## 2018-09-24 DIAGNOSIS — E1165 Type 2 diabetes mellitus with hyperglycemia: Secondary | ICD-10-CM | POA: Diagnosis not present

## 2018-10-07 DIAGNOSIS — Z85820 Personal history of malignant melanoma of skin: Secondary | ICD-10-CM | POA: Diagnosis not present

## 2018-10-07 DIAGNOSIS — D2371 Other benign neoplasm of skin of right lower limb, including hip: Secondary | ICD-10-CM | POA: Diagnosis not present

## 2018-10-07 DIAGNOSIS — L570 Actinic keratosis: Secondary | ICD-10-CM | POA: Diagnosis not present

## 2018-10-07 DIAGNOSIS — D2272 Melanocytic nevi of left lower limb, including hip: Secondary | ICD-10-CM | POA: Diagnosis not present

## 2018-10-07 DIAGNOSIS — L720 Epidermal cyst: Secondary | ICD-10-CM | POA: Diagnosis not present

## 2018-10-07 DIAGNOSIS — L814 Other melanin hyperpigmentation: Secondary | ICD-10-CM | POA: Diagnosis not present

## 2018-11-26 DIAGNOSIS — H40023 Open angle with borderline findings, high risk, bilateral: Secondary | ICD-10-CM | POA: Diagnosis not present

## 2018-12-24 DIAGNOSIS — E782 Mixed hyperlipidemia: Secondary | ICD-10-CM | POA: Diagnosis not present

## 2018-12-24 DIAGNOSIS — I10 Essential (primary) hypertension: Secondary | ICD-10-CM | POA: Diagnosis not present

## 2018-12-24 DIAGNOSIS — E1165 Type 2 diabetes mellitus with hyperglycemia: Secondary | ICD-10-CM | POA: Diagnosis not present

## 2018-12-31 DIAGNOSIS — E1165 Type 2 diabetes mellitus with hyperglycemia: Secondary | ICD-10-CM | POA: Diagnosis not present

## 2018-12-31 DIAGNOSIS — Z7189 Other specified counseling: Secondary | ICD-10-CM | POA: Diagnosis not present

## 2018-12-31 DIAGNOSIS — I10 Essential (primary) hypertension: Secondary | ICD-10-CM | POA: Diagnosis not present

## 2018-12-31 DIAGNOSIS — E782 Mixed hyperlipidemia: Secondary | ICD-10-CM | POA: Diagnosis not present

## 2018-12-31 DIAGNOSIS — E118 Type 2 diabetes mellitus with unspecified complications: Secondary | ICD-10-CM | POA: Diagnosis not present

## 2019-02-12 ENCOUNTER — Other Ambulatory Visit: Payer: Self-pay

## 2019-04-06 DIAGNOSIS — C44329 Squamous cell carcinoma of skin of other parts of face: Secondary | ICD-10-CM | POA: Diagnosis not present

## 2019-04-06 DIAGNOSIS — L814 Other melanin hyperpigmentation: Secondary | ICD-10-CM | POA: Diagnosis not present

## 2019-04-06 DIAGNOSIS — D485 Neoplasm of uncertain behavior of skin: Secondary | ICD-10-CM | POA: Diagnosis not present

## 2019-04-06 DIAGNOSIS — D692 Other nonthrombocytopenic purpura: Secondary | ICD-10-CM | POA: Diagnosis not present

## 2019-04-06 DIAGNOSIS — L570 Actinic keratosis: Secondary | ICD-10-CM | POA: Diagnosis not present

## 2019-04-06 DIAGNOSIS — L720 Epidermal cyst: Secondary | ICD-10-CM | POA: Diagnosis not present

## 2019-04-22 DIAGNOSIS — Z85828 Personal history of other malignant neoplasm of skin: Secondary | ICD-10-CM | POA: Diagnosis not present

## 2019-04-22 DIAGNOSIS — C44329 Squamous cell carcinoma of skin of other parts of face: Secondary | ICD-10-CM | POA: Diagnosis not present

## 2019-04-28 DIAGNOSIS — Z0000 Encounter for general adult medical examination without abnormal findings: Secondary | ICD-10-CM | POA: Diagnosis not present

## 2019-04-28 DIAGNOSIS — E1165 Type 2 diabetes mellitus with hyperglycemia: Secondary | ICD-10-CM | POA: Diagnosis not present

## 2019-04-28 DIAGNOSIS — I10 Essential (primary) hypertension: Secondary | ICD-10-CM | POA: Diagnosis not present

## 2019-04-28 DIAGNOSIS — Z125 Encounter for screening for malignant neoplasm of prostate: Secondary | ICD-10-CM | POA: Diagnosis not present

## 2019-04-28 DIAGNOSIS — E118 Type 2 diabetes mellitus with unspecified complications: Secondary | ICD-10-CM | POA: Diagnosis not present

## 2019-04-28 DIAGNOSIS — Z7189 Other specified counseling: Secondary | ICD-10-CM | POA: Diagnosis not present

## 2019-04-28 DIAGNOSIS — E782 Mixed hyperlipidemia: Secondary | ICD-10-CM | POA: Diagnosis not present

## 2019-05-06 DIAGNOSIS — E118 Type 2 diabetes mellitus with unspecified complications: Secondary | ICD-10-CM | POA: Diagnosis not present

## 2019-05-06 DIAGNOSIS — Z7189 Other specified counseling: Secondary | ICD-10-CM | POA: Diagnosis not present

## 2019-05-06 DIAGNOSIS — E1165 Type 2 diabetes mellitus with hyperglycemia: Secondary | ICD-10-CM | POA: Diagnosis not present

## 2019-05-06 DIAGNOSIS — N400 Enlarged prostate without lower urinary tract symptoms: Secondary | ICD-10-CM | POA: Diagnosis not present

## 2019-05-06 DIAGNOSIS — I10 Essential (primary) hypertension: Secondary | ICD-10-CM | POA: Diagnosis not present

## 2019-05-06 DIAGNOSIS — E782 Mixed hyperlipidemia: Secondary | ICD-10-CM | POA: Diagnosis not present

## 2019-05-28 DIAGNOSIS — H40023 Open angle with borderline findings, high risk, bilateral: Secondary | ICD-10-CM | POA: Diagnosis not present

## 2019-05-28 DIAGNOSIS — H2513 Age-related nuclear cataract, bilateral: Secondary | ICD-10-CM | POA: Diagnosis not present

## 2019-05-28 DIAGNOSIS — E119 Type 2 diabetes mellitus without complications: Secondary | ICD-10-CM | POA: Diagnosis not present

## 2019-06-03 DIAGNOSIS — E1165 Type 2 diabetes mellitus with hyperglycemia: Secondary | ICD-10-CM | POA: Diagnosis not present

## 2019-06-03 DIAGNOSIS — I10 Essential (primary) hypertension: Secondary | ICD-10-CM | POA: Diagnosis not present

## 2019-06-03 DIAGNOSIS — E782 Mixed hyperlipidemia: Secondary | ICD-10-CM | POA: Diagnosis not present

## 2019-07-22 DIAGNOSIS — E782 Mixed hyperlipidemia: Secondary | ICD-10-CM | POA: Diagnosis not present

## 2019-07-22 DIAGNOSIS — E1165 Type 2 diabetes mellitus with hyperglycemia: Secondary | ICD-10-CM | POA: Diagnosis not present

## 2019-07-22 DIAGNOSIS — I10 Essential (primary) hypertension: Secondary | ICD-10-CM | POA: Diagnosis not present

## 2019-08-05 DIAGNOSIS — L720 Epidermal cyst: Secondary | ICD-10-CM | POA: Diagnosis not present

## 2019-08-05 DIAGNOSIS — Z85820 Personal history of malignant melanoma of skin: Secondary | ICD-10-CM | POA: Diagnosis not present

## 2019-08-05 DIAGNOSIS — Z85828 Personal history of other malignant neoplasm of skin: Secondary | ICD-10-CM | POA: Diagnosis not present

## 2019-08-05 DIAGNOSIS — L570 Actinic keratosis: Secondary | ICD-10-CM | POA: Diagnosis not present

## 2019-09-02 DIAGNOSIS — E1165 Type 2 diabetes mellitus with hyperglycemia: Secondary | ICD-10-CM | POA: Diagnosis not present

## 2019-09-02 DIAGNOSIS — E782 Mixed hyperlipidemia: Secondary | ICD-10-CM | POA: Diagnosis not present

## 2019-09-02 DIAGNOSIS — I10 Essential (primary) hypertension: Secondary | ICD-10-CM | POA: Diagnosis not present

## 2019-09-09 DIAGNOSIS — E782 Mixed hyperlipidemia: Secondary | ICD-10-CM | POA: Diagnosis not present

## 2019-09-09 DIAGNOSIS — E1165 Type 2 diabetes mellitus with hyperglycemia: Secondary | ICD-10-CM | POA: Diagnosis not present

## 2019-09-09 DIAGNOSIS — I10 Essential (primary) hypertension: Secondary | ICD-10-CM | POA: Diagnosis not present

## 2019-11-26 DIAGNOSIS — H40023 Open angle with borderline findings, high risk, bilateral: Secondary | ICD-10-CM | POA: Diagnosis not present

## 2019-12-16 DIAGNOSIS — E1165 Type 2 diabetes mellitus with hyperglycemia: Secondary | ICD-10-CM | POA: Diagnosis not present

## 2019-12-16 DIAGNOSIS — E782 Mixed hyperlipidemia: Secondary | ICD-10-CM | POA: Diagnosis not present

## 2019-12-16 DIAGNOSIS — I10 Essential (primary) hypertension: Secondary | ICD-10-CM | POA: Diagnosis not present

## 2019-12-23 DIAGNOSIS — R600 Localized edema: Secondary | ICD-10-CM | POA: Diagnosis not present

## 2019-12-23 DIAGNOSIS — M791 Myalgia, unspecified site: Secondary | ICD-10-CM | POA: Diagnosis not present

## 2019-12-23 DIAGNOSIS — E1165 Type 2 diabetes mellitus with hyperglycemia: Secondary | ICD-10-CM | POA: Diagnosis not present

## 2019-12-23 DIAGNOSIS — R531 Weakness: Secondary | ICD-10-CM | POA: Diagnosis not present

## 2019-12-23 DIAGNOSIS — R0602 Shortness of breath: Secondary | ICD-10-CM | POA: Diagnosis not present

## 2019-12-23 DIAGNOSIS — I10 Essential (primary) hypertension: Secondary | ICD-10-CM | POA: Diagnosis not present

## 2019-12-24 ENCOUNTER — Other Ambulatory Visit (HOSPITAL_COMMUNITY): Payer: Self-pay | Admitting: Internal Medicine

## 2019-12-24 DIAGNOSIS — R0602 Shortness of breath: Secondary | ICD-10-CM

## 2019-12-28 ENCOUNTER — Ambulatory Visit (HOSPITAL_COMMUNITY)
Admission: RE | Admit: 2019-12-28 | Discharge: 2019-12-28 | Disposition: A | Discharge status: Home / Self Care | Payer: Medicare Other | Source: Ambulatory Visit | Attending: Specialist | Admitting: Specialist

## 2019-12-28 ENCOUNTER — Other Ambulatory Visit: Payer: Self-pay

## 2019-12-28 DIAGNOSIS — R0602 Shortness of breath: Secondary | ICD-10-CM | POA: Insufficient documentation

## 2019-12-28 DIAGNOSIS — E119 Type 2 diabetes mellitus without complications: Secondary | ICD-10-CM | POA: Diagnosis not present

## 2019-12-28 DIAGNOSIS — I10 Essential (primary) hypertension: Secondary | ICD-10-CM | POA: Diagnosis not present

## 2019-12-28 DIAGNOSIS — E785 Hyperlipidemia, unspecified: Secondary | ICD-10-CM | POA: Insufficient documentation

## 2019-12-28 NOTE — Progress Notes (Signed)
  Echocardiogram 2D Echocardiogram has been performed.  Jennette Dubin 12/28/2019, 10:37 AM

## 2020-01-27 DIAGNOSIS — E1165 Type 2 diabetes mellitus with hyperglycemia: Secondary | ICD-10-CM | POA: Diagnosis not present

## 2020-01-27 DIAGNOSIS — E782 Mixed hyperlipidemia: Secondary | ICD-10-CM | POA: Diagnosis not present

## 2020-01-27 DIAGNOSIS — E538 Deficiency of other specified B group vitamins: Secondary | ICD-10-CM | POA: Diagnosis not present

## 2020-01-27 DIAGNOSIS — G629 Polyneuropathy, unspecified: Secondary | ICD-10-CM | POA: Diagnosis not present

## 2020-01-27 DIAGNOSIS — R269 Unspecified abnormalities of gait and mobility: Secondary | ICD-10-CM | POA: Diagnosis not present

## 2020-01-27 DIAGNOSIS — E559 Vitamin D deficiency, unspecified: Secondary | ICD-10-CM | POA: Diagnosis not present

## 2020-01-27 DIAGNOSIS — I10 Essential (primary) hypertension: Secondary | ICD-10-CM | POA: Diagnosis not present

## 2020-01-29 DIAGNOSIS — R262 Difficulty in walking, not elsewhere classified: Secondary | ICD-10-CM | POA: Diagnosis not present

## 2020-01-29 DIAGNOSIS — R2681 Unsteadiness on feet: Secondary | ICD-10-CM | POA: Diagnosis not present

## 2020-02-01 ENCOUNTER — Encounter: Payer: Self-pay | Admitting: Neurology

## 2020-02-03 DIAGNOSIS — R262 Difficulty in walking, not elsewhere classified: Secondary | ICD-10-CM | POA: Diagnosis not present

## 2020-02-03 DIAGNOSIS — R2681 Unsteadiness on feet: Secondary | ICD-10-CM | POA: Diagnosis not present

## 2020-02-03 DIAGNOSIS — Z23 Encounter for immunization: Secondary | ICD-10-CM | POA: Diagnosis not present

## 2020-02-05 DIAGNOSIS — R2681 Unsteadiness on feet: Secondary | ICD-10-CM | POA: Diagnosis not present

## 2020-02-05 DIAGNOSIS — R262 Difficulty in walking, not elsewhere classified: Secondary | ICD-10-CM | POA: Diagnosis not present

## 2020-02-08 DIAGNOSIS — L814 Other melanin hyperpigmentation: Secondary | ICD-10-CM | POA: Diagnosis not present

## 2020-02-08 DIAGNOSIS — L578 Other skin changes due to chronic exposure to nonionizing radiation: Secondary | ICD-10-CM | POA: Diagnosis not present

## 2020-02-08 DIAGNOSIS — Z85828 Personal history of other malignant neoplasm of skin: Secondary | ICD-10-CM | POA: Diagnosis not present

## 2020-02-08 DIAGNOSIS — L570 Actinic keratosis: Secondary | ICD-10-CM | POA: Diagnosis not present

## 2020-02-09 DIAGNOSIS — R2681 Unsteadiness on feet: Secondary | ICD-10-CM | POA: Diagnosis not present

## 2020-02-09 DIAGNOSIS — R262 Difficulty in walking, not elsewhere classified: Secondary | ICD-10-CM | POA: Diagnosis not present

## 2020-02-12 DIAGNOSIS — R262 Difficulty in walking, not elsewhere classified: Secondary | ICD-10-CM | POA: Diagnosis not present

## 2020-02-12 DIAGNOSIS — R2681 Unsteadiness on feet: Secondary | ICD-10-CM | POA: Diagnosis not present

## 2020-02-16 DIAGNOSIS — R2681 Unsteadiness on feet: Secondary | ICD-10-CM | POA: Diagnosis not present

## 2020-02-16 DIAGNOSIS — R262 Difficulty in walking, not elsewhere classified: Secondary | ICD-10-CM | POA: Diagnosis not present

## 2020-02-19 DIAGNOSIS — R262 Difficulty in walking, not elsewhere classified: Secondary | ICD-10-CM | POA: Diagnosis not present

## 2020-02-19 DIAGNOSIS — R2681 Unsteadiness on feet: Secondary | ICD-10-CM | POA: Diagnosis not present

## 2020-02-23 DIAGNOSIS — R2681 Unsteadiness on feet: Secondary | ICD-10-CM | POA: Diagnosis not present

## 2020-02-23 DIAGNOSIS — R262 Difficulty in walking, not elsewhere classified: Secondary | ICD-10-CM | POA: Diagnosis not present

## 2020-02-24 DIAGNOSIS — Z23 Encounter for immunization: Secondary | ICD-10-CM | POA: Diagnosis not present

## 2020-02-25 DIAGNOSIS — R262 Difficulty in walking, not elsewhere classified: Secondary | ICD-10-CM | POA: Diagnosis not present

## 2020-02-25 DIAGNOSIS — R2681 Unsteadiness on feet: Secondary | ICD-10-CM | POA: Diagnosis not present

## 2020-03-01 DIAGNOSIS — R2681 Unsteadiness on feet: Secondary | ICD-10-CM | POA: Diagnosis not present

## 2020-03-01 DIAGNOSIS — R262 Difficulty in walking, not elsewhere classified: Secondary | ICD-10-CM | POA: Diagnosis not present

## 2020-03-03 DIAGNOSIS — E1165 Type 2 diabetes mellitus with hyperglycemia: Secondary | ICD-10-CM | POA: Diagnosis not present

## 2020-03-03 DIAGNOSIS — E782 Mixed hyperlipidemia: Secondary | ICD-10-CM | POA: Diagnosis not present

## 2020-03-03 DIAGNOSIS — Z794 Long term (current) use of insulin: Secondary | ICD-10-CM | POA: Diagnosis not present

## 2020-03-03 DIAGNOSIS — I10 Essential (primary) hypertension: Secondary | ICD-10-CM | POA: Diagnosis not present

## 2020-03-03 DIAGNOSIS — Z6835 Body mass index (BMI) 35.0-35.9, adult: Secondary | ICD-10-CM | POA: Diagnosis not present

## 2020-03-14 DIAGNOSIS — R2681 Unsteadiness on feet: Secondary | ICD-10-CM | POA: Diagnosis not present

## 2020-03-14 DIAGNOSIS — R262 Difficulty in walking, not elsewhere classified: Secondary | ICD-10-CM | POA: Diagnosis not present

## 2020-03-16 DIAGNOSIS — E1165 Type 2 diabetes mellitus with hyperglycemia: Secondary | ICD-10-CM | POA: Diagnosis not present

## 2020-03-16 DIAGNOSIS — E782 Mixed hyperlipidemia: Secondary | ICD-10-CM | POA: Diagnosis not present

## 2020-03-16 DIAGNOSIS — I10 Essential (primary) hypertension: Secondary | ICD-10-CM | POA: Diagnosis not present

## 2020-03-24 DIAGNOSIS — R2681 Unsteadiness on feet: Secondary | ICD-10-CM | POA: Diagnosis not present

## 2020-03-24 DIAGNOSIS — R262 Difficulty in walking, not elsewhere classified: Secondary | ICD-10-CM | POA: Diagnosis not present

## 2020-03-24 NOTE — Progress Notes (Signed)
[facility] Clinic Note - Initial Visit   Date: 03/25/20  Hunter Santos MRN: [id number] DOB: Jun 06, 1951   Dear Dr. Ashby Dawes:  Thank you for your kind referral of Hunter Santos for consultation of imbalance. Although his history is well known to you, please allow [place] to reiterate it for the purpose of our medical record. The patient was accompanied to the clinic by self.    History of Present Illness: Hunter Santos is a 69 y.o. right-handed male with diabetes mellitus, hyperlipidemia, hypertension, and B12 deficiency presenting for evaluation of imbalance and leg weakness.   Starting around mid-2020, he began noticing difficulty with walking and imbalance. He feels that his legs get tired easily. He has been using a walker for the past 73-month, which was recommended by physical therapy. He has not suffered any falls. He denies low back pain.  He does not have weakness in the feet, but has noticed difficulty climbing stairs or getting up out of a chair.  He has very little numbness in the feet.   [place]  Out-side paper records, electronic medical record, and images have been reviewed where available and summarized as:  Lab 12/24/2019:  CK 33, ESR 0, TSH 1.46  Lab Results  Component Value Date   HGBA1C 8.1 (H) 09/12/2015   Lab Results  Component Value Date   ESRSEDRATE 5 05/22/2016    Past Medical History:  Diagnosis Date  . [place] ([place]) [place] 09/[place]  . Allergy   . Anemia    history of  . Arthritis    Left knee  . Candidal esophagitis ([place] 09/26/2015  . Diabetes mellitus   . History of kidney stones   . History of transfusion   . Hx of adenomatous colonic polyps 10/29/2016  . Hyperlipidemia   . Hypertension   . [place] ([place] 06[place]2013  . Obese   . RBBB 09/22/2015   noted on EKG  . [place]    . Sleep apnea    had surgery for deviated septum previously  . Streptococcal bacteremia 09/12/2015  . UGIB (upper gastrointestinal bleed)   . Ulcerative esophagitis     Past Surgical History:  Procedure Laterality Date  . COLONOSCOPY  10/2016  . CYSTOSCOPY N/A 08/23/2017   Procedure: CYSTOSCOPY;  Surgeon: WCeasar Mons MD;  [facility]  Service: Urology;  Laterality: N/A;  . ESOPHAGOGASTRODUODENOSCOPY N/A 09/22/2015   Procedure: ESOPHAGOGASTRODUODENOSCOPY (EGD);  Surgeon: CGatha Mayer MD;  [facility];  Service: Endoscopy;  Laterality: N/A;  do in ICU  . EXCISIONAL TOTAL KNEE ARTHROPLASTY Left 06/15/2013   Procedure: LEFT RESECTION TOTAL KNEE ARTHROPLASTY WITH PLACEMENT OF ANTIBIOTIC SPACERS;  Surgeon: MMauri Pole MD;  [facility];  Service: Orthopedics;  Laterality: Left;  . I & D KNEE WITH POLY EXCHANGE Left 09/12/2015   Procedure: IRRIGATION AND DEBRIDEMENT KNEE WITH POLY EXCHANGE LEFT TOTAL KNEE;  Surgeon: MParalee Cancel MD;  Location: [facility];  Service: Orthopedics;  Laterality: Left;  . left ankle  2006  . NASAL SEPTUM SURGERY  2011  . TONSILLECTOMY     as child  . TOTAL KNEE ARTHROPLASTY  04/29/2012   Procedure: TOTAL KNEE ARTHROPLASTY;  Surgeon: MMauri Pole MD;  [facility];  Service: Orthopedics;  Laterality: Left;  . TOTAL KNEE REVISION Left 08/17/2013   Procedure: REIMPLANTATION LEFT TOTAL KNEE ARTHROPLASTY  WITH REMOVAL OF ANTIBIOTIC SPACER ;  Surgeon: Mauri Pole, MD;  Location: [facility];  Service: Orthopedics;  Laterality: Left;  . TRANSURETHRAL RESECTION OF BLADDER TUMOR N/A 08/23/2017   Procedure: TRANSURETHRAL RESECTION OF BLADDER TUMOR (TURBT);  Surgeon: Ceasar Mons, MD;  Location: [facility];  Service: Urology;  Laterality: N/A;  ONLY NEEDS 45 MIN FOR ALL PROCEDURES     Medications:  Outpatient Encounter Medications as of 03/25/2020  Medication Sig Note  . amLODipine (NORVASC)  10 MG tablet Take 10 mg by mouth daily.   Marland Kitchen aspirin EC 81 MG tablet Take 81 mg by mouth daily. 08/23/2017: Stopped taking a week ago  . glimepiride (AMARYL) 4 MG tablet Take 4 mg by mouth daily before breakfast.   . JARDIANCE 25 MG TABS tablet Take 25 mg by mouth daily. 08/23/2017: Hasn't taken in 2-3 weeks  . metFORMIN (GLUCOPHAGE) 500 MG tablet Take 1,000 mg by mouth 2 (two) times daily with a meal. 2 pills in AM, 1 pill mid day, 1 pill at dinner   . pravastatin (PRAVACHOL) 40 MG tablet Take 40 mg by mouth every evening.   . ramipril (ALTACE) 10 MG capsule Take 10 mg by mouth daily.   . TRULICITY 1.5 VZ/5.6LO SOPN Inject 1.5 mg into the skin every Monday.   . furosemide (LASIX) 40 MG tablet Take 40 mg by mouth daily before breakfast. (Patient not taking: Reported on 03/25/2020)   . HYDROcodone-acetaminophen (NORCO) 5-325 MG tablet Take 1 tablet by mouth every 4 (four) hours as needed for moderate pain. (Patient not taking: Reported on 03/25/2020)    No facility-administered encounter medications on file as of 03/25/2020.    Allergies: No Known Allergies  Family History: Family History  Problem Relation Age of Onset  . Cancer Father        Lung  . Colon cancer Neg Hx   . Esophageal cancer Neg Hx   . Rectal cancer Neg Hx   . Stomach cancer Neg Hx     Social History: Social History   Tobacco Use  . Smoking status: Never Smoker  . Smokeless tobacco: Never Used  Vaping Use  . Vaping Use: Never used  Substance Use Topics  . Alcohol use: Yes    Comment: rare  . Drug use: No   Social History   Social History Narrative   Right Handed   Lives in a two story home   Drinks sugar free coke or dr. Billey Gosling potato chips     Vital Signs:  BP 128/75   Pulse 94   Ht $R'6\' 2"'Yr$  (1.88 m)   Wt 253 lb (114.8 kg)   SpO2 99%   BMI 32.48 kg/m   Neurological Exam: MENTAL STATUS including orientation to time, place, person, recent and remote memory, attention span and concentration,  language, and fund of knowledge is normal.  Speech is not dysarthric.  CRANIAL NERVES: II:  No visual field defects.  III-IV-VI: Pupils equal round and reactive.  Normal conjugate, extra-ocular eye movements in all directions of gaze.  No nystagmus.  No ptosis.   V:  Normal facial sensation.    VII:  Normal facial symmetry and movements.   VIII:  Normal hearing and vestibular function.   IX-X:  Normal palatal movement.   XI:  Normal shoulder shrug and head rotation.   XII:  Normal tongue strength and range of motion, no deviation or fasciculation.  MOTOR:  No atrophy, fasciculations or abnormal  movements.  No pronator drift.   Upper Extremity:  Right  Left  Deltoid  5/5   5/5   Biceps  5/5   5/5   Triceps  5/5   5/5   Infraspinatus 5/5  5/5  Medial pectoralis 5/5  5/5  Wrist extensors  5/5   5/5   Wrist flexors  5/5   5/5   Finger extensors  5/5   5/5   Finger flexors  5/5   5/5   Dorsal interossei  5/5   5/5   Abductor pollicis  5/5   5/5   Tone (Ashworth scale)  0  0   Lower Extremity:  Right  Left  Hip flexors  4+/5   4/5   Hip extensors  5/5   5/5   Adductor 5-/5  5-/5  Abductor 5/5  5/5  Knee flexors  5/5   5/5   Knee extensors  5/5   5/5   Dorsiflexors  5/5   5/5   Plantarflexors  5/5   5/5   Toe extensors  5/5   5/5   Toe flexors  5/5   5/5   Tone (Ashworth scale)  0  0   MSRs:  Right        Left                  brachioradialis 2+  2+  biceps 2+  2+  triceps 2+  2+  patellar 2+  2+  ankle jerk 1+  1+  Hoffman no  no  plantar response down  down   SENSORY:  Vibration is diminished at the great toe bilaterally, temperature and pin prick intact.  Sensation in the arms intact.  COORDINATION/GAIT: Normal finger-to- nose-finger.  Intact rapid alternating movements bilaterally.  Unable to rise from a chair without using arms.  Gait wide-based, assisted with walker.  He is able to stand on heels and toes.   IMPRESSION: Proximal leg weakness and imbalance.   Exam shows bilateral hip flexor weakness concerning for lumbar canal stenosis causing neurogenic claudication.    - MRI lumbar spine wo contrast   - Continue PT  - Always use a walker  - Fall precautions discussed  Distal diabetic peripheral neuropathy, predominately sensory and mild.  This may be contributing to some of his ataxia, but would not explain proximal leg weakness   Further recommendations pending results.   Thank you for allowing me to participate in patient's care.  If I can answer any additional questions, I would be pleased to do so.    Sincerely,    Neema Fluegge K. Posey Pronto, DO

## 2020-03-25 ENCOUNTER — Other Ambulatory Visit: Payer: Self-pay

## 2020-03-25 ENCOUNTER — Ambulatory Visit (INDEPENDENT_AMBULATORY_CARE_PROVIDER_SITE_OTHER): Payer: Medicare Other | Admitting: Neurology

## 2020-03-25 ENCOUNTER — Encounter: Payer: Self-pay | Admitting: Neurology

## 2020-03-25 VITALS — BP 128/75 | HR 94 | Ht 74.0 in | Wt 253.0 lb

## 2020-03-25 DIAGNOSIS — M48062 Spinal stenosis, lumbar region with neurogenic claudication: Secondary | ICD-10-CM | POA: Diagnosis not present

## 2020-03-25 DIAGNOSIS — E1142 Type 2 diabetes mellitus with diabetic polyneuropathy: Secondary | ICD-10-CM | POA: Diagnosis not present

## 2020-03-25 DIAGNOSIS — E782 Mixed hyperlipidemia: Secondary | ICD-10-CM | POA: Diagnosis not present

## 2020-03-25 DIAGNOSIS — R269 Unspecified abnormalities of gait and mobility: Secondary | ICD-10-CM | POA: Diagnosis not present

## 2020-03-25 DIAGNOSIS — E1165 Type 2 diabetes mellitus with hyperglycemia: Secondary | ICD-10-CM | POA: Diagnosis not present

## 2020-03-25 DIAGNOSIS — I10 Essential (primary) hypertension: Secondary | ICD-10-CM | POA: Diagnosis not present

## 2020-03-25 NOTE — Patient Instructions (Addendum)
MRI lumbar spine.  We will call you with the results when available.   Check your feet daily  Always use your walker

## 2020-03-31 DIAGNOSIS — R2681 Unsteadiness on feet: Secondary | ICD-10-CM | POA: Diagnosis not present

## 2020-03-31 DIAGNOSIS — R262 Difficulty in walking, not elsewhere classified: Secondary | ICD-10-CM | POA: Diagnosis not present

## 2020-04-07 DIAGNOSIS — R2681 Unsteadiness on feet: Secondary | ICD-10-CM | POA: Diagnosis not present

## 2020-04-07 DIAGNOSIS — R262 Difficulty in walking, not elsewhere classified: Secondary | ICD-10-CM | POA: Diagnosis not present

## 2020-04-13 ENCOUNTER — Telehealth: Payer: Self-pay | Admitting: Neurology

## 2020-04-13 ENCOUNTER — Other Ambulatory Visit: Payer: Self-pay

## 2020-04-13 ENCOUNTER — Ambulatory Visit
Admission: RE | Admit: 2020-04-13 | Discharge: 2020-04-13 | Disposition: A | Discharge status: Home / Self Care | Payer: Medicare Other | Source: Ambulatory Visit | Attending: Neurology | Admitting: Neurology

## 2020-04-13 DIAGNOSIS — M48062 Spinal stenosis, lumbar region with neurogenic claudication: Secondary | ICD-10-CM

## 2020-04-13 DIAGNOSIS — M48061 Spinal stenosis, lumbar region without neurogenic claudication: Secondary | ICD-10-CM | POA: Diagnosis not present

## 2020-04-13 NOTE — Telephone Encounter (Signed)
Called and discussed results of MRI which shows moderate to severe spinal stenosis at L3-4 with biforaminal stenosis and lateral recess stenosis, which would explain his leg weakness and gait difficulty.  He has been to PT which has helped some, but legs remain weak.  He will be referred to [facility] for their opinion on whether there is a role for decompression.

## 2020-04-15 DIAGNOSIS — R262 Difficulty in walking, not elsewhere classified: Secondary | ICD-10-CM | POA: Diagnosis not present

## 2020-04-15 DIAGNOSIS — R2681 Unsteadiness on feet: Secondary | ICD-10-CM | POA: Diagnosis not present

## 2020-04-18 DIAGNOSIS — M48062 Spinal stenosis, lumbar region with neurogenic claudication: Secondary | ICD-10-CM | POA: Diagnosis not present

## 2020-04-20 DIAGNOSIS — R2681 Unsteadiness on feet: Secondary | ICD-10-CM | POA: Diagnosis not present

## 2020-04-20 DIAGNOSIS — R262 Difficulty in walking, not elsewhere classified: Secondary | ICD-10-CM | POA: Diagnosis not present

## 2020-04-27 DIAGNOSIS — R2681 Unsteadiness on feet: Secondary | ICD-10-CM | POA: Diagnosis not present

## 2020-04-27 DIAGNOSIS — R262 Difficulty in walking, not elsewhere classified: Secondary | ICD-10-CM | POA: Diagnosis not present

## 2020-05-05 ENCOUNTER — Ambulatory Visit: Payer: Medicare Other | Admitting: Neurology

## 2020-06-01 DIAGNOSIS — H401131 Primary open-angle glaucoma, bilateral, mild stage: Secondary | ICD-10-CM | POA: Diagnosis not present

## 2020-06-01 DIAGNOSIS — H2513 Age-related nuclear cataract, bilateral: Secondary | ICD-10-CM | POA: Diagnosis not present

## 2020-06-01 DIAGNOSIS — E119 Type 2 diabetes mellitus without complications: Secondary | ICD-10-CM | POA: Diagnosis not present

## 2020-06-02 ENCOUNTER — Ambulatory Visit: Payer: Medicare Other | Admitting: Neurology

## 2020-06-02 DIAGNOSIS — Z6835 Body mass index (BMI) 35.0-35.9, adult: Secondary | ICD-10-CM | POA: Diagnosis not present

## 2020-06-02 DIAGNOSIS — Z794 Long term (current) use of insulin: Secondary | ICD-10-CM | POA: Diagnosis not present

## 2020-06-02 DIAGNOSIS — E782 Mixed hyperlipidemia: Secondary | ICD-10-CM | POA: Diagnosis not present

## 2020-06-02 DIAGNOSIS — E1165 Type 2 diabetes mellitus with hyperglycemia: Secondary | ICD-10-CM | POA: Diagnosis not present

## 2020-06-02 DIAGNOSIS — E118 Type 2 diabetes mellitus with unspecified complications: Secondary | ICD-10-CM | POA: Diagnosis not present

## 2020-06-02 DIAGNOSIS — I10 Essential (primary) hypertension: Secondary | ICD-10-CM | POA: Diagnosis not present

## 2020-07-22 ENCOUNTER — Other Ambulatory Visit: Payer: Self-pay | Admitting: Neurosurgery

## 2020-07-29 DIAGNOSIS — C4442 Squamous cell carcinoma of skin of scalp and neck: Secondary | ICD-10-CM | POA: Diagnosis not present

## 2020-07-29 DIAGNOSIS — L821 Other seborrheic keratosis: Secondary | ICD-10-CM | POA: Diagnosis not present

## 2020-07-29 DIAGNOSIS — Z1152 Encounter for screening for COVID-19: Secondary | ICD-10-CM | POA: Diagnosis not present

## 2020-07-29 DIAGNOSIS — D0439 Carcinoma in situ of skin of other parts of face: Secondary | ICD-10-CM | POA: Diagnosis not present

## 2020-07-29 DIAGNOSIS — L720 Epidermal cyst: Secondary | ICD-10-CM | POA: Diagnosis not present

## 2020-07-29 DIAGNOSIS — D692 Other nonthrombocytopenic purpura: Secondary | ICD-10-CM | POA: Diagnosis not present

## 2020-07-29 DIAGNOSIS — L570 Actinic keratosis: Secondary | ICD-10-CM | POA: Diagnosis not present

## 2020-07-29 DIAGNOSIS — Z85828 Personal history of other malignant neoplasm of skin: Secondary | ICD-10-CM | POA: Diagnosis not present

## 2020-08-03 DIAGNOSIS — M48062 Spinal stenosis, lumbar region with neurogenic claudication: Secondary | ICD-10-CM | POA: Diagnosis not present

## 2020-08-04 ENCOUNTER — Ambulatory Visit: Admit: 2020-08-04 | Payer: Medicare Other | Admitting: Neurosurgery

## 2020-08-04 SURGERY — LUMBAR LAMINECTOMY/DECOMPRESSION MICRODISCECTOMY 2 LEVELS
Anesthesia: General

## 2020-08-26 DIAGNOSIS — R262 Difficulty in walking, not elsewhere classified: Secondary | ICD-10-CM | POA: Diagnosis not present

## 2020-08-26 DIAGNOSIS — M48062 Spinal stenosis, lumbar region with neurogenic claudication: Secondary | ICD-10-CM | POA: Diagnosis not present

## 2020-08-29 DIAGNOSIS — C44329 Squamous cell carcinoma of skin of other parts of face: Secondary | ICD-10-CM | POA: Diagnosis not present

## 2020-08-29 DIAGNOSIS — Z85828 Personal history of other malignant neoplasm of skin: Secondary | ICD-10-CM | POA: Diagnosis not present

## 2020-08-29 DIAGNOSIS — C4442 Squamous cell carcinoma of skin of scalp and neck: Secondary | ICD-10-CM | POA: Diagnosis not present

## 2020-08-31 DIAGNOSIS — M48062 Spinal stenosis, lumbar region with neurogenic claudication: Secondary | ICD-10-CM | POA: Diagnosis not present

## 2020-08-31 DIAGNOSIS — R262 Difficulty in walking, not elsewhere classified: Secondary | ICD-10-CM | POA: Diagnosis not present

## 2020-09-02 DIAGNOSIS — R262 Difficulty in walking, not elsewhere classified: Secondary | ICD-10-CM | POA: Diagnosis not present

## 2020-09-02 DIAGNOSIS — E782 Mixed hyperlipidemia: Secondary | ICD-10-CM | POA: Diagnosis not present

## 2020-09-02 DIAGNOSIS — E1165 Type 2 diabetes mellitus with hyperglycemia: Secondary | ICD-10-CM | POA: Diagnosis not present

## 2020-09-02 DIAGNOSIS — M48062 Spinal stenosis, lumbar region with neurogenic claudication: Secondary | ICD-10-CM | POA: Diagnosis not present

## 2020-09-02 DIAGNOSIS — I10 Essential (primary) hypertension: Secondary | ICD-10-CM | POA: Diagnosis not present

## 2020-09-05 DIAGNOSIS — R262 Difficulty in walking, not elsewhere classified: Secondary | ICD-10-CM | POA: Diagnosis not present

## 2020-09-05 DIAGNOSIS — M48062 Spinal stenosis, lumbar region with neurogenic claudication: Secondary | ICD-10-CM | POA: Diagnosis not present

## 2020-09-09 DIAGNOSIS — R269 Unspecified abnormalities of gait and mobility: Secondary | ICD-10-CM | POA: Diagnosis not present

## 2020-09-09 DIAGNOSIS — M48062 Spinal stenosis, lumbar region with neurogenic claudication: Secondary | ICD-10-CM | POA: Diagnosis not present

## 2020-09-09 DIAGNOSIS — R262 Difficulty in walking, not elsewhere classified: Secondary | ICD-10-CM | POA: Diagnosis not present

## 2020-09-09 DIAGNOSIS — E1165 Type 2 diabetes mellitus with hyperglycemia: Secondary | ICD-10-CM | POA: Diagnosis not present

## 2020-09-09 DIAGNOSIS — I10 Essential (primary) hypertension: Secondary | ICD-10-CM | POA: Diagnosis not present

## 2020-09-09 DIAGNOSIS — E782 Mixed hyperlipidemia: Secondary | ICD-10-CM | POA: Diagnosis not present

## 2020-09-09 DIAGNOSIS — Z794 Long term (current) use of insulin: Secondary | ICD-10-CM | POA: Diagnosis not present

## 2020-09-13 DIAGNOSIS — R262 Difficulty in walking, not elsewhere classified: Secondary | ICD-10-CM | POA: Diagnosis not present

## 2020-09-13 DIAGNOSIS — M48062 Spinal stenosis, lumbar region with neurogenic claudication: Secondary | ICD-10-CM | POA: Diagnosis not present

## 2020-09-16 DIAGNOSIS — R262 Difficulty in walking, not elsewhere classified: Secondary | ICD-10-CM | POA: Diagnosis not present

## 2020-09-16 DIAGNOSIS — M48062 Spinal stenosis, lumbar region with neurogenic claudication: Secondary | ICD-10-CM | POA: Diagnosis not present

## 2020-09-20 DIAGNOSIS — R262 Difficulty in walking, not elsewhere classified: Secondary | ICD-10-CM | POA: Diagnosis not present

## 2020-09-20 DIAGNOSIS — M48062 Spinal stenosis, lumbar region with neurogenic claudication: Secondary | ICD-10-CM | POA: Diagnosis not present

## 2020-09-23 DIAGNOSIS — R262 Difficulty in walking, not elsewhere classified: Secondary | ICD-10-CM | POA: Diagnosis not present

## 2020-09-23 DIAGNOSIS — M48062 Spinal stenosis, lumbar region with neurogenic claudication: Secondary | ICD-10-CM | POA: Diagnosis not present

## 2020-09-27 DIAGNOSIS — M48062 Spinal stenosis, lumbar region with neurogenic claudication: Secondary | ICD-10-CM | POA: Diagnosis not present

## 2020-09-27 DIAGNOSIS — R262 Difficulty in walking, not elsewhere classified: Secondary | ICD-10-CM | POA: Diagnosis not present

## 2020-09-29 DIAGNOSIS — E1169 Type 2 diabetes mellitus with other specified complication: Secondary | ICD-10-CM | POA: Diagnosis not present

## 2020-09-29 DIAGNOSIS — Z6833 Body mass index (BMI) 33.0-33.9, adult: Secondary | ICD-10-CM | POA: Diagnosis not present

## 2020-09-29 DIAGNOSIS — E1165 Type 2 diabetes mellitus with hyperglycemia: Secondary | ICD-10-CM | POA: Diagnosis not present

## 2020-09-29 DIAGNOSIS — Z794 Long term (current) use of insulin: Secondary | ICD-10-CM | POA: Diagnosis not present

## 2020-09-29 DIAGNOSIS — I10 Essential (primary) hypertension: Secondary | ICD-10-CM | POA: Diagnosis not present

## 2020-09-29 DIAGNOSIS — E782 Mixed hyperlipidemia: Secondary | ICD-10-CM | POA: Diagnosis not present

## 2020-09-30 DIAGNOSIS — R262 Difficulty in walking, not elsewhere classified: Secondary | ICD-10-CM | POA: Diagnosis not present

## 2020-09-30 DIAGNOSIS — M48062 Spinal stenosis, lumbar region with neurogenic claudication: Secondary | ICD-10-CM | POA: Diagnosis not present

## 2020-10-04 DIAGNOSIS — R262 Difficulty in walking, not elsewhere classified: Secondary | ICD-10-CM | POA: Diagnosis not present

## 2020-10-04 DIAGNOSIS — M48062 Spinal stenosis, lumbar region with neurogenic claudication: Secondary | ICD-10-CM | POA: Diagnosis not present

## 2020-10-07 DIAGNOSIS — M48062 Spinal stenosis, lumbar region with neurogenic claudication: Secondary | ICD-10-CM | POA: Diagnosis not present

## 2020-10-07 DIAGNOSIS — R262 Difficulty in walking, not elsewhere classified: Secondary | ICD-10-CM | POA: Diagnosis not present

## 2020-10-11 DIAGNOSIS — M48062 Spinal stenosis, lumbar region with neurogenic claudication: Secondary | ICD-10-CM | POA: Diagnosis not present

## 2020-10-11 DIAGNOSIS — R262 Difficulty in walking, not elsewhere classified: Secondary | ICD-10-CM | POA: Diagnosis not present

## 2020-10-18 DIAGNOSIS — R262 Difficulty in walking, not elsewhere classified: Secondary | ICD-10-CM | POA: Diagnosis not present

## 2020-10-18 DIAGNOSIS — M48062 Spinal stenosis, lumbar region with neurogenic claudication: Secondary | ICD-10-CM | POA: Diagnosis not present

## 2020-10-20 DIAGNOSIS — R262 Difficulty in walking, not elsewhere classified: Secondary | ICD-10-CM | POA: Diagnosis not present

## 2020-10-20 DIAGNOSIS — M48062 Spinal stenosis, lumbar region with neurogenic claudication: Secondary | ICD-10-CM | POA: Diagnosis not present

## 2020-10-25 DIAGNOSIS — M48062 Spinal stenosis, lumbar region with neurogenic claudication: Secondary | ICD-10-CM | POA: Diagnosis not present

## 2020-10-25 DIAGNOSIS — R262 Difficulty in walking, not elsewhere classified: Secondary | ICD-10-CM | POA: Diagnosis not present

## 2020-10-28 DIAGNOSIS — R262 Difficulty in walking, not elsewhere classified: Secondary | ICD-10-CM | POA: Diagnosis not present

## 2020-10-28 DIAGNOSIS — M48062 Spinal stenosis, lumbar region with neurogenic claudication: Secondary | ICD-10-CM | POA: Diagnosis not present

## 2020-11-01 DIAGNOSIS — R262 Difficulty in walking, not elsewhere classified: Secondary | ICD-10-CM | POA: Diagnosis not present

## 2020-11-01 DIAGNOSIS — M48062 Spinal stenosis, lumbar region with neurogenic claudication: Secondary | ICD-10-CM | POA: Diagnosis not present

## 2020-11-08 DIAGNOSIS — R262 Difficulty in walking, not elsewhere classified: Secondary | ICD-10-CM | POA: Diagnosis not present

## 2020-11-08 DIAGNOSIS — M48062 Spinal stenosis, lumbar region with neurogenic claudication: Secondary | ICD-10-CM | POA: Diagnosis not present

## 2020-11-10 DIAGNOSIS — R262 Difficulty in walking, not elsewhere classified: Secondary | ICD-10-CM | POA: Diagnosis not present

## 2020-11-10 DIAGNOSIS — M48062 Spinal stenosis, lumbar region with neurogenic claudication: Secondary | ICD-10-CM | POA: Diagnosis not present

## 2020-11-15 DIAGNOSIS — M48062 Spinal stenosis, lumbar region with neurogenic claudication: Secondary | ICD-10-CM | POA: Diagnosis not present

## 2020-11-15 DIAGNOSIS — R262 Difficulty in walking, not elsewhere classified: Secondary | ICD-10-CM | POA: Diagnosis not present

## 2020-11-17 DIAGNOSIS — R262 Difficulty in walking, not elsewhere classified: Secondary | ICD-10-CM | POA: Diagnosis not present

## 2020-11-17 DIAGNOSIS — M48062 Spinal stenosis, lumbar region with neurogenic claudication: Secondary | ICD-10-CM | POA: Diagnosis not present

## 2020-11-23 IMAGING — MR MR LUMBAR SPINE W/O CM
4 of 5 series · 25 of 48 positions shown · non-contrast
Comparison: CT Abdomen and Pelvis 09/23/2015.

Chest radiographs 10/20/2010.

CLINICAL DATA: 69-year-old male with gait disorder. Loss of balance
for 6-7 months. No known injury.

EXAM:
MRI LUMBAR SPINE WITHOUT CONTRAST
TECHNIQUE: Multiplanar, multisequence MR imaging of the lumbar spine was
performed. No intravenous contrast was administered.

[Series 2: T2 · sagittal · 4.0mm · 1.09mm/px · 6 of 17 slices shown (1 of 2)]
[im 1/17]
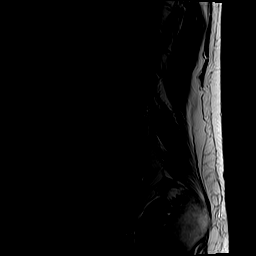
[im 4/17]
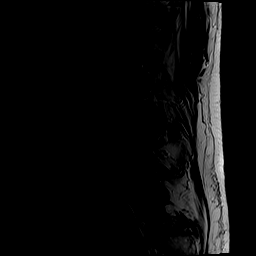
[im 7/17]
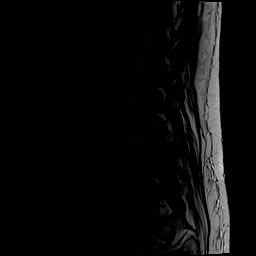
[im 10/17]
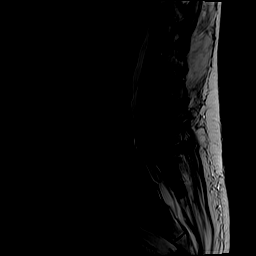
[im 13/17]
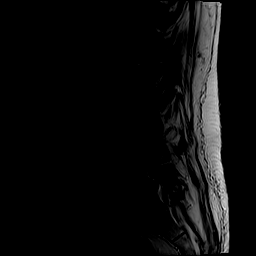
[im 17/17]
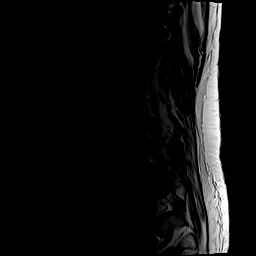

[Series 4: T1 · sagittal · 4.0mm · 1.09mm/px · 6 of 17 slices shown (1 of 2)]
[im 1/17]
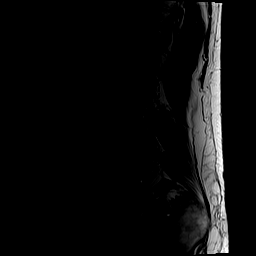
[im 4/17]
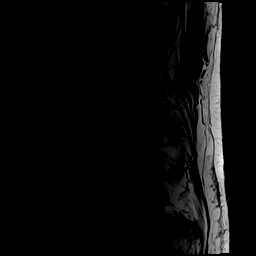
[im 7/17]
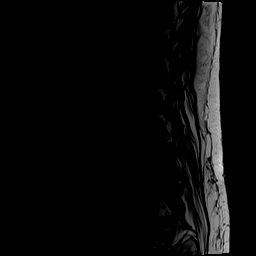
[im 10/17]
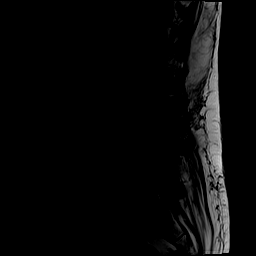
[im 13/17]
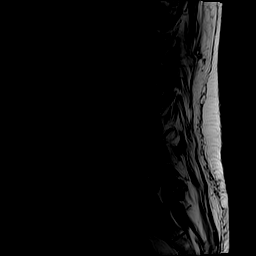
[im 17/17]
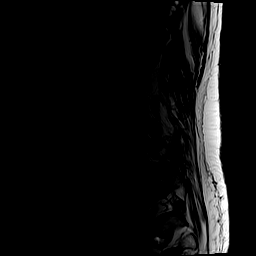

[Series 5: T2 · axial · 4.0mm · 0.39mm/px · z∈[-92,+135]mm · 9 of 42 slices shown (2 of 2)]
[im 1/42]
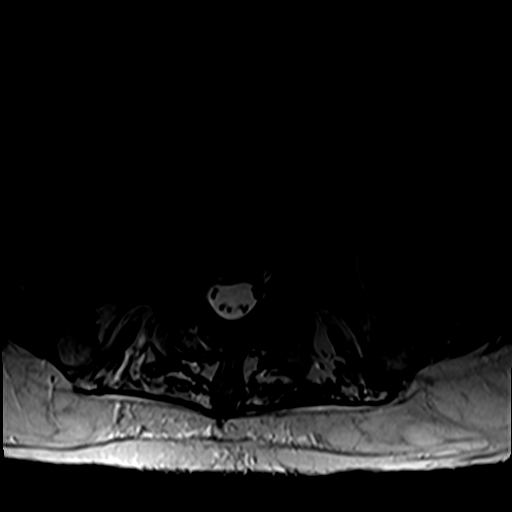
[im 6/42]
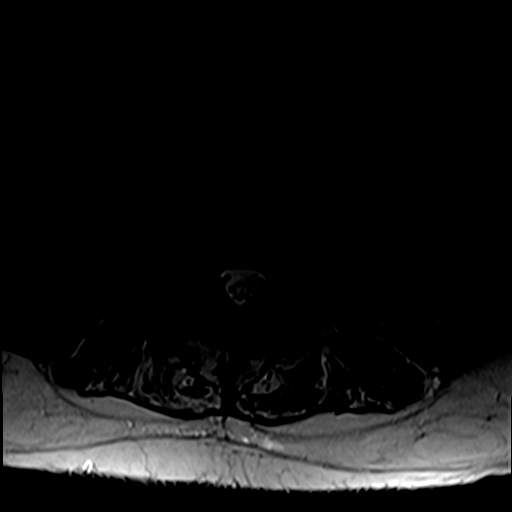
[im 12/42]
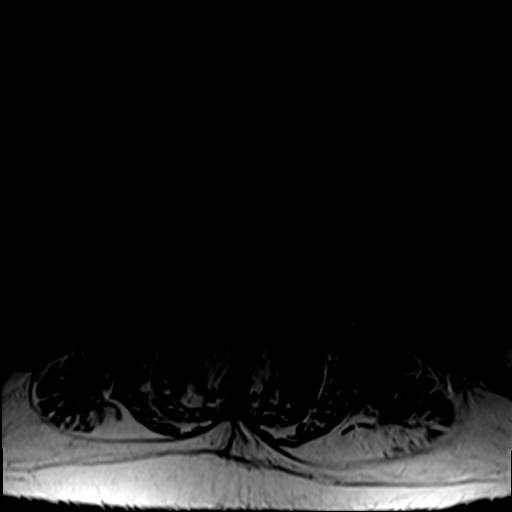
[im 18/42]
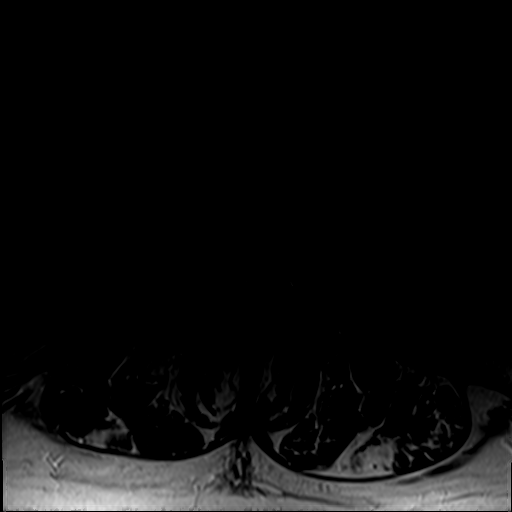
[im 21/42]
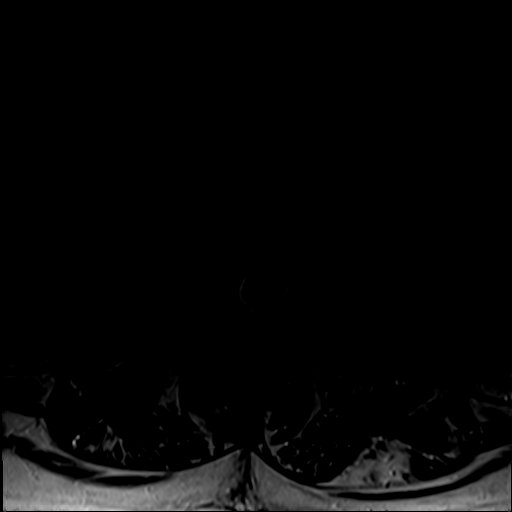
[im 24/42]
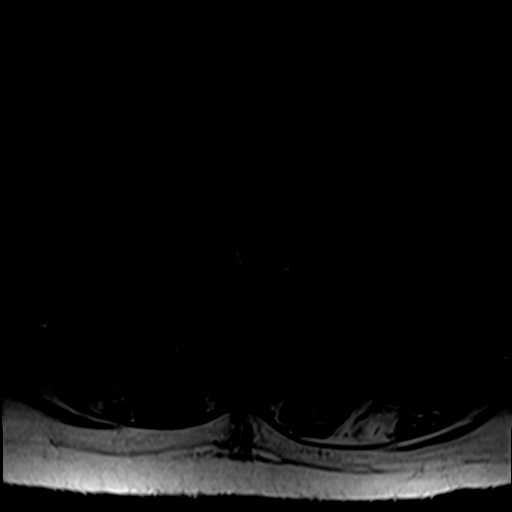
[im 30/42]
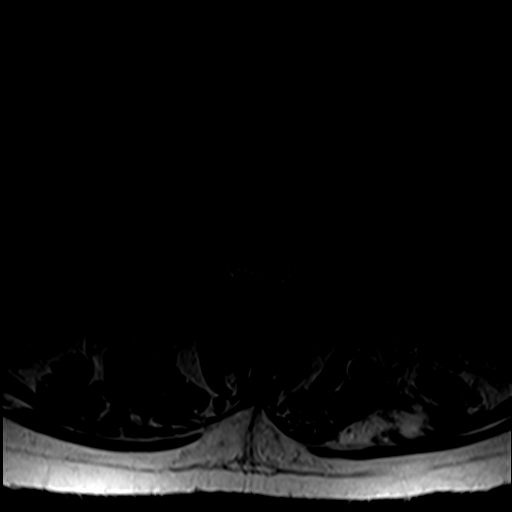
[im 36/42]
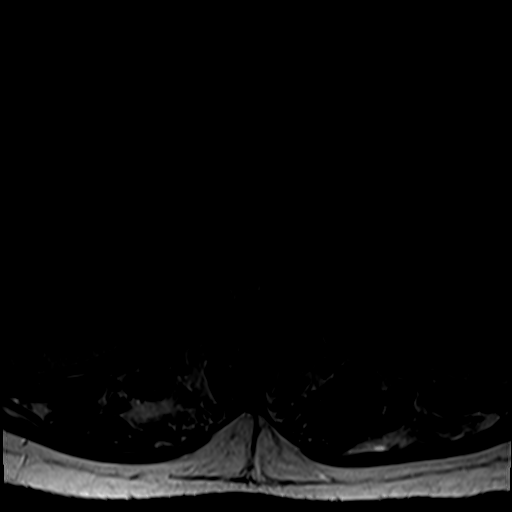
[im 42/42]
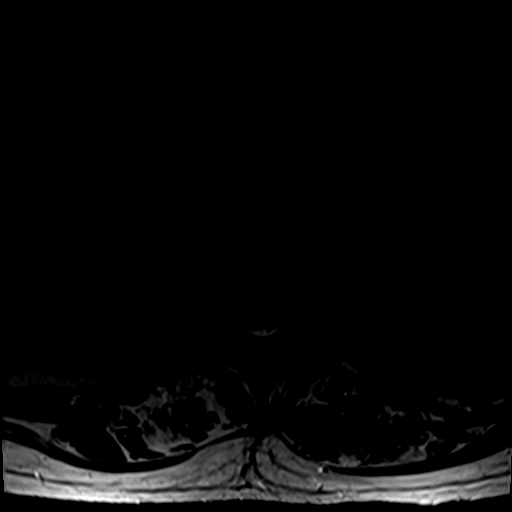

[Series 6: T1 · axial · 4.0mm · 0.39mm/px · z∈[-92,+106]mm · 4 of 42 slices shown (2 of 2)]
[im 1/42]
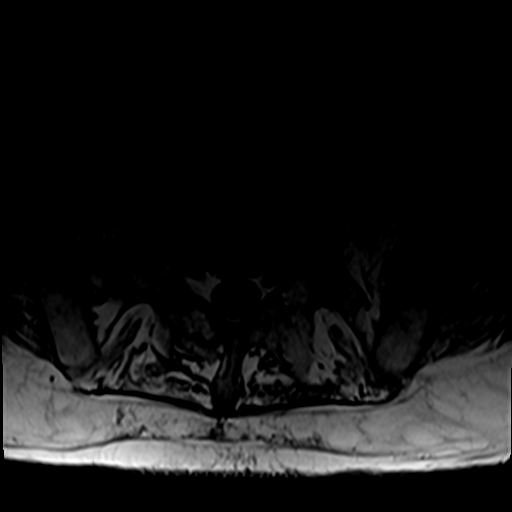
[im 6/42]
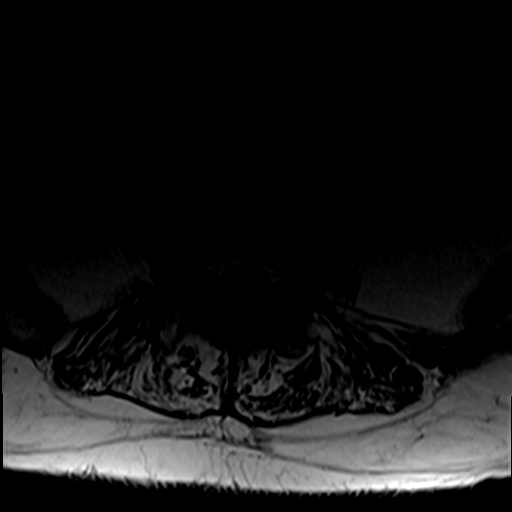
[im 21/42]
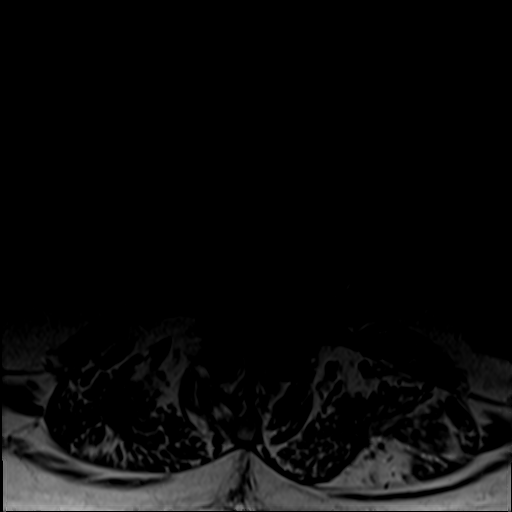
[im 36/42]
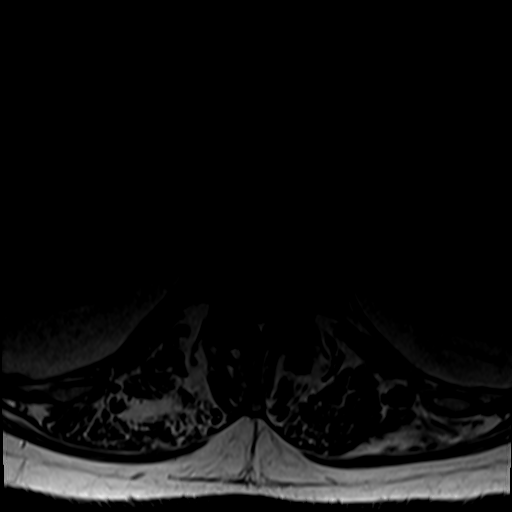

[25 of 48 positions shown; findings below may reference images not displayed]

FINDINGS: Segmentation: 12 pairs of ribs demonstrated in 0490, and the 5778 CT
suggest transitional lumbosacral anatomy with a mostly sacralized L5
level, lowest ribs at T12. Correlation with radiographs is
recommended prior to any operative intervention.

Alignment: Chronic grade 1 anterolisthesis at both L2-L3 and L3-L4,
stable since 5778. Subtle anterolisthesis has developed at L4-L5
(series 2, image 7). Mild underlying levoconvex lumbar scoliosis.

Vertebrae: Chronic degenerative endplate marrow changes at L2-L3 and
L4-L5. Complete loss of the disc space at L4-L5 where vacuum disc
was noted in 5778. The bilateral facets of ankylosed at that level
since 5778, and there is likely developing interbody ankylosis there
also.

No marrow edema or evidence of acute osseous abnormality. Normal
background bone marrow signal. Intact visible sacrum.

Conus medullaris and cauda equina: Conus extends to the T12 level.
No lower spinal cord or conus signal abnormality.

Paraspinal and other soft tissues: Stable since 5778, negative.

Disc levels:

T10-T11: Disc space loss with mild circumferential disc bulge and
moderate posterior element hypertrophy. Mild lower thoracic spinal
stenosis. No definite cord mass effect. Moderate to severe left T10
foraminal stenosis.

T11-T12: Mild facet hypertrophy.

T12-L1:  Mild facet hypertrophy.

L1-L2: Mild mostly far lateral disc bulging. There is broad-based
left foraminal involvement. Mild to moderate posterior element
hypertrophy. No spinal or lateral recess stenosis. Mild bilateral L1
foraminal stenosis.

L2-L3: Grade 1 anterolisthesis with severe disc space loss. Vacuum
disc appears increased since 5778. Circumferential disc osteophyte
complex and moderate to severe posterior element hypertrophy.
Moderate to severe spinal stenosis (series 5, image 25). No
significant lateral recess stenosis. Mild to moderate bilateral L2
foraminal stenosis.

L3-L4: Subtle anterolisthesis. Disc space loss and desiccation.
Bulky circumferential disc osteophyte complex eccentric to the
right. Severe facet and ligament flavum hypertrophy. Severe spinal
and right lateral recess stenosis (right L4 nerve level). Moderate
right greater than left L3 foraminal stenosis.

L4-L5: Bulky facet hypertrophy and circumferential endplate spurring
but ankylosis of this level. Mild right lateral recess stenosis.
Moderate left L4 foraminal stenosis.

L5-S1:  Sacralized, negative.
IMPRESSION: 1. Transitional lumbosacral anatomy with a sacralized L5 level,
vestigial L5-S1 disc space. Correlation with radiographs is
recommended prior to any operative intervention.

2. Ankylosis of the chronically degenerated L4-L5 level since 5778.
Chronic grade 1 anterolisthesis at L2-L3 and L3-L4 with advanced
disc and posterior element degeneration at both levels.
Subsequent moderate to severe multifactorial spinal stenosis at both
levels. Severe right lateral recess stenosis at the right L4 nerve
level. Moderate bilateral L3 and left L4 neural foraminal stenosis.

3. Mild multifactorial lower thoracic spinal stenosis at T10-T11
with moderate to severe left T10 foraminal stenosis.

## 2020-11-24 DIAGNOSIS — M48062 Spinal stenosis, lumbar region with neurogenic claudication: Secondary | ICD-10-CM | POA: Diagnosis not present

## 2020-11-24 DIAGNOSIS — R262 Difficulty in walking, not elsewhere classified: Secondary | ICD-10-CM | POA: Diagnosis not present

## 2020-11-30 DIAGNOSIS — H401131 Primary open-angle glaucoma, bilateral, mild stage: Secondary | ICD-10-CM | POA: Diagnosis not present

## 2021-01-20 DIAGNOSIS — Z125 Encounter for screening for malignant neoplasm of prostate: Secondary | ICD-10-CM | POA: Diagnosis not present

## 2021-01-20 DIAGNOSIS — I10 Essential (primary) hypertension: Secondary | ICD-10-CM | POA: Diagnosis not present

## 2021-01-20 DIAGNOSIS — E118 Type 2 diabetes mellitus with unspecified complications: Secondary | ICD-10-CM | POA: Diagnosis not present

## 2021-01-20 DIAGNOSIS — E789 Disorder of lipoprotein metabolism, unspecified: Secondary | ICD-10-CM | POA: Diagnosis not present

## 2021-01-20 DIAGNOSIS — R5383 Other fatigue: Secondary | ICD-10-CM | POA: Diagnosis not present

## 2021-01-27 DIAGNOSIS — R600 Localized edema: Secondary | ICD-10-CM | POA: Diagnosis not present

## 2021-01-27 DIAGNOSIS — Z794 Long term (current) use of insulin: Secondary | ICD-10-CM | POA: Diagnosis not present

## 2021-01-27 DIAGNOSIS — N182 Chronic kidney disease, stage 2 (mild): Secondary | ICD-10-CM | POA: Diagnosis not present

## 2021-01-27 DIAGNOSIS — N400 Enlarged prostate without lower urinary tract symptoms: Secondary | ICD-10-CM | POA: Diagnosis not present

## 2021-01-27 DIAGNOSIS — I10 Essential (primary) hypertension: Secondary | ICD-10-CM | POA: Diagnosis not present

## 2021-01-27 DIAGNOSIS — Z0000 Encounter for general adult medical examination without abnormal findings: Secondary | ICD-10-CM | POA: Diagnosis not present

## 2021-01-27 DIAGNOSIS — E1165 Type 2 diabetes mellitus with hyperglycemia: Secondary | ICD-10-CM | POA: Diagnosis not present

## 2021-01-27 DIAGNOSIS — E782 Mixed hyperlipidemia: Secondary | ICD-10-CM | POA: Diagnosis not present

## 2021-02-07 DIAGNOSIS — L570 Actinic keratosis: Secondary | ICD-10-CM | POA: Diagnosis not present

## 2021-02-07 DIAGNOSIS — Z85828 Personal history of other malignant neoplasm of skin: Secondary | ICD-10-CM | POA: Diagnosis not present

## 2021-02-07 DIAGNOSIS — L720 Epidermal cyst: Secondary | ICD-10-CM | POA: Diagnosis not present

## 2021-02-07 DIAGNOSIS — L814 Other melanin hyperpigmentation: Secondary | ICD-10-CM | POA: Diagnosis not present

## 2021-04-03 DIAGNOSIS — E1165 Type 2 diabetes mellitus with hyperglycemia: Secondary | ICD-10-CM | POA: Diagnosis not present

## 2021-04-03 DIAGNOSIS — Z794 Long term (current) use of insulin: Secondary | ICD-10-CM | POA: Diagnosis not present

## 2021-04-03 DIAGNOSIS — I10 Essential (primary) hypertension: Secondary | ICD-10-CM | POA: Diagnosis not present

## 2021-04-03 DIAGNOSIS — Z6833 Body mass index (BMI) 33.0-33.9, adult: Secondary | ICD-10-CM | POA: Diagnosis not present

## 2021-04-03 DIAGNOSIS — E1169 Type 2 diabetes mellitus with other specified complication: Secondary | ICD-10-CM | POA: Diagnosis not present

## 2021-04-03 DIAGNOSIS — E782 Mixed hyperlipidemia: Secondary | ICD-10-CM | POA: Diagnosis not present

## 2021-05-15 DIAGNOSIS — I10 Essential (primary) hypertension: Secondary | ICD-10-CM | POA: Diagnosis not present

## 2021-05-15 DIAGNOSIS — E782 Mixed hyperlipidemia: Secondary | ICD-10-CM | POA: Diagnosis not present

## 2021-05-15 DIAGNOSIS — E1165 Type 2 diabetes mellitus with hyperglycemia: Secondary | ICD-10-CM | POA: Diagnosis not present

## 2021-06-01 DIAGNOSIS — H401131 Primary open-angle glaucoma, bilateral, mild stage: Secondary | ICD-10-CM | POA: Diagnosis not present

## 2021-06-01 DIAGNOSIS — E119 Type 2 diabetes mellitus without complications: Secondary | ICD-10-CM | POA: Diagnosis not present

## 2021-06-01 DIAGNOSIS — H2513 Age-related nuclear cataract, bilateral: Secondary | ICD-10-CM | POA: Diagnosis not present

## 2021-07-28 DIAGNOSIS — E1165 Type 2 diabetes mellitus with hyperglycemia: Secondary | ICD-10-CM | POA: Diagnosis not present

## 2021-07-28 DIAGNOSIS — N400 Enlarged prostate without lower urinary tract symptoms: Secondary | ICD-10-CM | POA: Diagnosis not present

## 2021-07-28 DIAGNOSIS — N182 Chronic kidney disease, stage 2 (mild): Secondary | ICD-10-CM | POA: Diagnosis not present

## 2021-07-28 DIAGNOSIS — E782 Mixed hyperlipidemia: Secondary | ICD-10-CM | POA: Diagnosis not present

## 2021-07-28 DIAGNOSIS — I10 Essential (primary) hypertension: Secondary | ICD-10-CM | POA: Diagnosis not present

## 2021-08-04 DIAGNOSIS — E1165 Type 2 diabetes mellitus with hyperglycemia: Secondary | ICD-10-CM | POA: Diagnosis not present

## 2021-08-04 DIAGNOSIS — I10 Essential (primary) hypertension: Secondary | ICD-10-CM | POA: Diagnosis not present

## 2021-08-04 DIAGNOSIS — Z794 Long term (current) use of insulin: Secondary | ICD-10-CM | POA: Diagnosis not present

## 2021-08-04 DIAGNOSIS — E782 Mixed hyperlipidemia: Secondary | ICD-10-CM | POA: Diagnosis not present

## 2021-08-04 DIAGNOSIS — R29898 Other symptoms and signs involving the musculoskeletal system: Secondary | ICD-10-CM | POA: Diagnosis not present

## 2021-08-04 DIAGNOSIS — N182 Chronic kidney disease, stage 2 (mild): Secondary | ICD-10-CM | POA: Diagnosis not present

## 2021-08-11 DIAGNOSIS — D485 Neoplasm of uncertain behavior of skin: Secondary | ICD-10-CM | POA: Diagnosis not present

## 2021-08-11 DIAGNOSIS — D692 Other nonthrombocytopenic purpura: Secondary | ICD-10-CM | POA: Diagnosis not present

## 2021-08-11 DIAGNOSIS — H6121 Impacted cerumen, right ear: Secondary | ICD-10-CM | POA: Diagnosis not present

## 2021-08-11 DIAGNOSIS — L308 Other specified dermatitis: Secondary | ICD-10-CM | POA: Diagnosis not present

## 2021-08-11 DIAGNOSIS — L570 Actinic keratosis: Secondary | ICD-10-CM | POA: Diagnosis not present

## 2021-08-11 DIAGNOSIS — Z85828 Personal history of other malignant neoplasm of skin: Secondary | ICD-10-CM | POA: Diagnosis not present

## 2021-09-27 DIAGNOSIS — E1165 Type 2 diabetes mellitus with hyperglycemia: Secondary | ICD-10-CM | POA: Diagnosis not present

## 2021-09-27 DIAGNOSIS — E782 Mixed hyperlipidemia: Secondary | ICD-10-CM | POA: Diagnosis not present

## 2021-09-27 DIAGNOSIS — I10 Essential (primary) hypertension: Secondary | ICD-10-CM | POA: Diagnosis not present

## 2021-09-27 DIAGNOSIS — Z794 Long term (current) use of insulin: Secondary | ICD-10-CM | POA: Diagnosis not present

## 2021-09-27 DIAGNOSIS — E1169 Type 2 diabetes mellitus with other specified complication: Secondary | ICD-10-CM | POA: Diagnosis not present

## 2021-09-27 DIAGNOSIS — Z6833 Body mass index (BMI) 33.0-33.9, adult: Secondary | ICD-10-CM | POA: Diagnosis not present

## 2021-12-04 ENCOUNTER — Encounter: Payer: Self-pay | Admitting: Internal Medicine

## 2022-02-02 DIAGNOSIS — N182 Chronic kidney disease, stage 2 (mild): Secondary | ICD-10-CM | POA: Diagnosis not present

## 2022-02-02 DIAGNOSIS — I10 Essential (primary) hypertension: Secondary | ICD-10-CM | POA: Diagnosis not present

## 2022-02-02 DIAGNOSIS — E782 Mixed hyperlipidemia: Secondary | ICD-10-CM | POA: Diagnosis not present

## 2022-02-02 DIAGNOSIS — Z0000 Encounter for general adult medical examination without abnormal findings: Secondary | ICD-10-CM | POA: Diagnosis not present

## 2022-02-02 DIAGNOSIS — R29898 Other symptoms and signs involving the musculoskeletal system: Secondary | ICD-10-CM | POA: Diagnosis not present

## 2022-02-02 DIAGNOSIS — E1165 Type 2 diabetes mellitus with hyperglycemia: Secondary | ICD-10-CM | POA: Diagnosis not present

## 2022-02-02 DIAGNOSIS — Z794 Long term (current) use of insulin: Secondary | ICD-10-CM | POA: Diagnosis not present

## 2022-02-07 DIAGNOSIS — R29898 Other symptoms and signs involving the musculoskeletal system: Secondary | ICD-10-CM | POA: Diagnosis not present

## 2022-02-07 DIAGNOSIS — Z794 Long term (current) use of insulin: Secondary | ICD-10-CM | POA: Diagnosis not present

## 2022-02-07 DIAGNOSIS — E1165 Type 2 diabetes mellitus with hyperglycemia: Secondary | ICD-10-CM | POA: Diagnosis not present

## 2022-02-07 DIAGNOSIS — N182 Chronic kidney disease, stage 2 (mild): Secondary | ICD-10-CM | POA: Diagnosis not present

## 2022-02-07 DIAGNOSIS — E782 Mixed hyperlipidemia: Secondary | ICD-10-CM | POA: Diagnosis not present

## 2022-02-07 DIAGNOSIS — I10 Essential (primary) hypertension: Secondary | ICD-10-CM | POA: Diagnosis not present

## 2022-03-20 DIAGNOSIS — L570 Actinic keratosis: Secondary | ICD-10-CM | POA: Diagnosis not present

## 2022-03-20 DIAGNOSIS — L720 Epidermal cyst: Secondary | ICD-10-CM | POA: Diagnosis not present

## 2022-03-20 DIAGNOSIS — Z85828 Personal history of other malignant neoplasm of skin: Secondary | ICD-10-CM | POA: Diagnosis not present

## 2022-03-20 DIAGNOSIS — D485 Neoplasm of uncertain behavior of skin: Secondary | ICD-10-CM | POA: Diagnosis not present

## 2022-03-20 DIAGNOSIS — L814 Other melanin hyperpigmentation: Secondary | ICD-10-CM | POA: Diagnosis not present

## 2022-03-20 DIAGNOSIS — L218 Other seborrheic dermatitis: Secondary | ICD-10-CM | POA: Diagnosis not present

## 2022-03-20 DIAGNOSIS — D692 Other nonthrombocytopenic purpura: Secondary | ICD-10-CM | POA: Diagnosis not present

## 2022-03-22 DIAGNOSIS — E1165 Type 2 diabetes mellitus with hyperglycemia: Secondary | ICD-10-CM | POA: Diagnosis not present

## 2022-04-30 DIAGNOSIS — E1165 Type 2 diabetes mellitus with hyperglycemia: Secondary | ICD-10-CM | POA: Diagnosis not present

## 2022-04-30 DIAGNOSIS — N182 Chronic kidney disease, stage 2 (mild): Secondary | ICD-10-CM | POA: Diagnosis not present

## 2022-04-30 DIAGNOSIS — I10 Essential (primary) hypertension: Secondary | ICD-10-CM | POA: Diagnosis not present

## 2022-04-30 DIAGNOSIS — E782 Mixed hyperlipidemia: Secondary | ICD-10-CM | POA: Diagnosis not present

## 2022-08-01 DIAGNOSIS — E782 Mixed hyperlipidemia: Secondary | ICD-10-CM | POA: Diagnosis not present

## 2022-08-01 DIAGNOSIS — E1165 Type 2 diabetes mellitus with hyperglycemia: Secondary | ICD-10-CM | POA: Diagnosis not present

## 2022-08-02 DIAGNOSIS — H2513 Age-related nuclear cataract, bilateral: Secondary | ICD-10-CM | POA: Diagnosis not present

## 2022-08-02 DIAGNOSIS — H401131 Primary open-angle glaucoma, bilateral, mild stage: Secondary | ICD-10-CM | POA: Diagnosis not present

## 2022-08-08 DIAGNOSIS — E1165 Type 2 diabetes mellitus with hyperglycemia: Secondary | ICD-10-CM | POA: Diagnosis not present

## 2022-08-08 DIAGNOSIS — Z794 Long term (current) use of insulin: Secondary | ICD-10-CM | POA: Diagnosis not present

## 2022-08-08 DIAGNOSIS — E782 Mixed hyperlipidemia: Secondary | ICD-10-CM | POA: Diagnosis not present

## 2022-08-08 DIAGNOSIS — R29898 Other symptoms and signs involving the musculoskeletal system: Secondary | ICD-10-CM | POA: Diagnosis not present

## 2022-08-08 DIAGNOSIS — I10 Essential (primary) hypertension: Secondary | ICD-10-CM | POA: Diagnosis not present

## 2022-08-08 DIAGNOSIS — R269 Unspecified abnormalities of gait and mobility: Secondary | ICD-10-CM | POA: Diagnosis not present

## 2022-08-08 DIAGNOSIS — N182 Chronic kidney disease, stage 2 (mild): Secondary | ICD-10-CM | POA: Diagnosis not present

## 2022-08-08 DIAGNOSIS — Z23 Encounter for immunization: Secondary | ICD-10-CM | POA: Diagnosis not present

## 2022-08-31 DIAGNOSIS — Z794 Long term (current) use of insulin: Secondary | ICD-10-CM | POA: Diagnosis not present

## 2022-08-31 DIAGNOSIS — N182 Chronic kidney disease, stage 2 (mild): Secondary | ICD-10-CM | POA: Diagnosis not present

## 2022-08-31 DIAGNOSIS — I10 Essential (primary) hypertension: Secondary | ICD-10-CM | POA: Diagnosis not present

## 2022-08-31 DIAGNOSIS — E1165 Type 2 diabetes mellitus with hyperglycemia: Secondary | ICD-10-CM | POA: Diagnosis not present

## 2022-08-31 DIAGNOSIS — E782 Mixed hyperlipidemia: Secondary | ICD-10-CM | POA: Diagnosis not present

## 2022-09-18 DIAGNOSIS — L218 Other seborrheic dermatitis: Secondary | ICD-10-CM | POA: Diagnosis not present

## 2022-09-18 DIAGNOSIS — L820 Inflamed seborrheic keratosis: Secondary | ICD-10-CM | POA: Diagnosis not present

## 2022-09-18 DIAGNOSIS — Z85828 Personal history of other malignant neoplasm of skin: Secondary | ICD-10-CM | POA: Diagnosis not present

## 2022-09-18 DIAGNOSIS — L570 Actinic keratosis: Secondary | ICD-10-CM | POA: Diagnosis not present

## 2022-09-27 ENCOUNTER — Telehealth: Payer: Self-pay

## 2022-09-27 NOTE — Patient Outreach (Signed)
  Care Coordination   09/27/2022 Name: Hunter Santos MRN: [id number] DOB: August 29, 1950   Care Coordination Outreach Attempts:  An unsuccessful telephone outreach was attempted today to offer the patient information about available care coordination services as a benefit of their health plan.   Follow Up Plan:  Additional outreach attempts will be made to offer the patient care coordination information and services.   Encounter Outcome:  No Answer   Care Coordination Interventions:  No, not indicated     Jone Baseman, RN, MSN [facility] Care Management Coordinator Direct Line [phone]

## 2022-12-31 DIAGNOSIS — E1165 Type 2 diabetes mellitus with hyperglycemia: Secondary | ICD-10-CM | POA: Diagnosis not present

## 2022-12-31 DIAGNOSIS — I10 Essential (primary) hypertension: Secondary | ICD-10-CM | POA: Diagnosis not present

## 2022-12-31 DIAGNOSIS — G629 Polyneuropathy, unspecified: Secondary | ICD-10-CM | POA: Diagnosis not present

## 2022-12-31 DIAGNOSIS — E782 Mixed hyperlipidemia: Secondary | ICD-10-CM | POA: Diagnosis not present

## 2023-02-06 DIAGNOSIS — H401131 Primary open-angle glaucoma, bilateral, mild stage: Secondary | ICD-10-CM | POA: Diagnosis not present

## 2023-03-06 DIAGNOSIS — E1165 Type 2 diabetes mellitus with hyperglycemia: Secondary | ICD-10-CM | POA: Diagnosis not present

## 2023-03-06 DIAGNOSIS — N182 Chronic kidney disease, stage 2 (mild): Secondary | ICD-10-CM | POA: Diagnosis not present

## 2023-03-06 DIAGNOSIS — R29898 Other symptoms and signs involving the musculoskeletal system: Secondary | ICD-10-CM | POA: Diagnosis not present

## 2023-03-06 DIAGNOSIS — I10 Essential (primary) hypertension: Secondary | ICD-10-CM | POA: Diagnosis not present

## 2023-03-06 DIAGNOSIS — R5383 Other fatigue: Secondary | ICD-10-CM | POA: Diagnosis not present

## 2023-03-06 DIAGNOSIS — E782 Mixed hyperlipidemia: Secondary | ICD-10-CM | POA: Diagnosis not present

## 2023-03-06 DIAGNOSIS — Z794 Long term (current) use of insulin: Secondary | ICD-10-CM | POA: Diagnosis not present

## 2023-03-13 DIAGNOSIS — E1165 Type 2 diabetes mellitus with hyperglycemia: Secondary | ICD-10-CM | POA: Diagnosis not present

## 2023-03-13 DIAGNOSIS — N182 Chronic kidney disease, stage 2 (mild): Secondary | ICD-10-CM | POA: Diagnosis not present

## 2023-03-13 DIAGNOSIS — R29898 Other symptoms and signs involving the musculoskeletal system: Secondary | ICD-10-CM | POA: Diagnosis not present

## 2023-03-13 DIAGNOSIS — Z0000 Encounter for general adult medical examination without abnormal findings: Secondary | ICD-10-CM | POA: Diagnosis not present

## 2023-03-13 DIAGNOSIS — Z794 Long term (current) use of insulin: Secondary | ICD-10-CM | POA: Diagnosis not present

## 2023-03-13 DIAGNOSIS — I10 Essential (primary) hypertension: Secondary | ICD-10-CM | POA: Diagnosis not present

## 2023-03-13 DIAGNOSIS — D751 Secondary polycythemia: Secondary | ICD-10-CM | POA: Diagnosis not present

## 2023-03-13 DIAGNOSIS — R269 Unspecified abnormalities of gait and mobility: Secondary | ICD-10-CM | POA: Diagnosis not present

## 2023-03-13 DIAGNOSIS — R5383 Other fatigue: Secondary | ICD-10-CM | POA: Diagnosis not present

## 2023-03-13 DIAGNOSIS — E782 Mixed hyperlipidemia: Secondary | ICD-10-CM | POA: Diagnosis not present

## 2023-04-03 DIAGNOSIS — E782 Mixed hyperlipidemia: Secondary | ICD-10-CM | POA: Diagnosis not present

## 2023-04-03 DIAGNOSIS — I10 Essential (primary) hypertension: Secondary | ICD-10-CM | POA: Diagnosis not present

## 2023-04-03 DIAGNOSIS — E1165 Type 2 diabetes mellitus with hyperglycemia: Secondary | ICD-10-CM | POA: Diagnosis not present

## 2023-04-03 DIAGNOSIS — N182 Chronic kidney disease, stage 2 (mild): Secondary | ICD-10-CM | POA: Diagnosis not present

## 2023-04-03 DIAGNOSIS — Z794 Long term (current) use of insulin: Secondary | ICD-10-CM | POA: Diagnosis not present

## 2023-05-27 DIAGNOSIS — L578 Other skin changes due to chronic exposure to nonionizing radiation: Secondary | ICD-10-CM | POA: Diagnosis not present

## 2023-05-27 DIAGNOSIS — L570 Actinic keratosis: Secondary | ICD-10-CM | POA: Diagnosis not present

## 2023-05-27 DIAGNOSIS — Z85828 Personal history of other malignant neoplasm of skin: Secondary | ICD-10-CM | POA: Diagnosis not present

## 2023-07-25 DIAGNOSIS — N182 Chronic kidney disease, stage 2 (mild): Secondary | ICD-10-CM | POA: Diagnosis not present

## 2023-07-25 DIAGNOSIS — R29898 Other symptoms and signs involving the musculoskeletal system: Secondary | ICD-10-CM | POA: Diagnosis not present

## 2023-07-25 DIAGNOSIS — Z794 Long term (current) use of insulin: Secondary | ICD-10-CM | POA: Diagnosis not present

## 2023-07-25 DIAGNOSIS — D751 Secondary polycythemia: Secondary | ICD-10-CM | POA: Diagnosis not present

## 2023-07-25 DIAGNOSIS — E782 Mixed hyperlipidemia: Secondary | ICD-10-CM | POA: Diagnosis not present

## 2023-07-25 DIAGNOSIS — E1165 Type 2 diabetes mellitus with hyperglycemia: Secondary | ICD-10-CM | POA: Diagnosis not present

## 2023-07-25 DIAGNOSIS — R5383 Other fatigue: Secondary | ICD-10-CM | POA: Diagnosis not present

## 2023-07-25 DIAGNOSIS — I10 Essential (primary) hypertension: Secondary | ICD-10-CM | POA: Diagnosis not present

## 2023-08-01 DIAGNOSIS — E1165 Type 2 diabetes mellitus with hyperglycemia: Secondary | ICD-10-CM | POA: Diagnosis not present

## 2023-08-01 DIAGNOSIS — R5383 Other fatigue: Secondary | ICD-10-CM | POA: Diagnosis not present

## 2023-08-01 DIAGNOSIS — R269 Unspecified abnormalities of gait and mobility: Secondary | ICD-10-CM | POA: Diagnosis not present

## 2023-08-01 DIAGNOSIS — R29898 Other symptoms and signs involving the musculoskeletal system: Secondary | ICD-10-CM | POA: Diagnosis not present

## 2023-08-01 DIAGNOSIS — E782 Mixed hyperlipidemia: Secondary | ICD-10-CM | POA: Diagnosis not present

## 2023-08-01 DIAGNOSIS — Z794 Long term (current) use of insulin: Secondary | ICD-10-CM | POA: Diagnosis not present

## 2023-08-01 DIAGNOSIS — D751 Secondary polycythemia: Secondary | ICD-10-CM | POA: Diagnosis not present

## 2023-08-01 DIAGNOSIS — N182 Chronic kidney disease, stage 2 (mild): Secondary | ICD-10-CM | POA: Diagnosis not present

## 2023-08-01 DIAGNOSIS — I10 Essential (primary) hypertension: Secondary | ICD-10-CM | POA: Diagnosis not present

## 2023-08-02 DIAGNOSIS — Z794 Long term (current) use of insulin: Secondary | ICD-10-CM | POA: Diagnosis not present

## 2023-08-02 DIAGNOSIS — I10 Essential (primary) hypertension: Secondary | ICD-10-CM | POA: Diagnosis not present

## 2023-08-02 DIAGNOSIS — E782 Mixed hyperlipidemia: Secondary | ICD-10-CM | POA: Diagnosis not present

## 2023-08-02 DIAGNOSIS — E1165 Type 2 diabetes mellitus with hyperglycemia: Secondary | ICD-10-CM | POA: Diagnosis not present

## 2023-08-02 DIAGNOSIS — N182 Chronic kidney disease, stage 2 (mild): Secondary | ICD-10-CM | POA: Diagnosis not present

## 2023-08-28 DIAGNOSIS — H401131 Primary open-angle glaucoma, bilateral, mild stage: Secondary | ICD-10-CM | POA: Diagnosis not present

## 2023-08-28 DIAGNOSIS — E119 Type 2 diabetes mellitus without complications: Secondary | ICD-10-CM | POA: Diagnosis not present

## 2023-08-28 DIAGNOSIS — H2513 Age-related nuclear cataract, bilateral: Secondary | ICD-10-CM | POA: Diagnosis not present

## 2024-01-23 DIAGNOSIS — I10 Essential (primary) hypertension: Secondary | ICD-10-CM | POA: Diagnosis not present

## 2024-01-23 DIAGNOSIS — D751 Secondary polycythemia: Secondary | ICD-10-CM | POA: Diagnosis not present

## 2024-01-23 DIAGNOSIS — E782 Mixed hyperlipidemia: Secondary | ICD-10-CM | POA: Diagnosis not present

## 2024-01-23 DIAGNOSIS — N182 Chronic kidney disease, stage 2 (mild): Secondary | ICD-10-CM | POA: Diagnosis not present

## 2024-01-23 DIAGNOSIS — E1165 Type 2 diabetes mellitus with hyperglycemia: Secondary | ICD-10-CM | POA: Diagnosis not present

## 2024-01-23 DIAGNOSIS — R5383 Other fatigue: Secondary | ICD-10-CM | POA: Diagnosis not present

## 2024-01-23 DIAGNOSIS — Z794 Long term (current) use of insulin: Secondary | ICD-10-CM | POA: Diagnosis not present

## 2024-01-24 DIAGNOSIS — Z85828 Personal history of other malignant neoplasm of skin: Secondary | ICD-10-CM | POA: Diagnosis not present

## 2024-01-24 DIAGNOSIS — L570 Actinic keratosis: Secondary | ICD-10-CM | POA: Diagnosis not present

## 2024-01-24 DIAGNOSIS — L578 Other skin changes due to chronic exposure to nonionizing radiation: Secondary | ICD-10-CM | POA: Diagnosis not present

## 2024-01-29 DIAGNOSIS — N182 Chronic kidney disease, stage 2 (mild): Secondary | ICD-10-CM | POA: Diagnosis not present

## 2024-01-29 DIAGNOSIS — I10 Essential (primary) hypertension: Secondary | ICD-10-CM | POA: Diagnosis not present

## 2024-01-29 DIAGNOSIS — E782 Mixed hyperlipidemia: Secondary | ICD-10-CM | POA: Diagnosis not present

## 2024-01-29 DIAGNOSIS — Z794 Long term (current) use of insulin: Secondary | ICD-10-CM | POA: Diagnosis not present

## 2024-01-29 DIAGNOSIS — E1165 Type 2 diabetes mellitus with hyperglycemia: Secondary | ICD-10-CM | POA: Diagnosis not present

## 2024-01-30 DIAGNOSIS — R5383 Other fatigue: Secondary | ICD-10-CM | POA: Diagnosis not present

## 2024-01-30 DIAGNOSIS — R29898 Other symptoms and signs involving the musculoskeletal system: Secondary | ICD-10-CM | POA: Diagnosis not present

## 2024-01-30 DIAGNOSIS — N182 Chronic kidney disease, stage 2 (mild): Secondary | ICD-10-CM | POA: Diagnosis not present

## 2024-01-30 DIAGNOSIS — I10 Essential (primary) hypertension: Secondary | ICD-10-CM | POA: Diagnosis not present

## 2024-01-30 DIAGNOSIS — E1165 Type 2 diabetes mellitus with hyperglycemia: Secondary | ICD-10-CM | POA: Diagnosis not present

## 2024-01-30 DIAGNOSIS — D751 Secondary polycythemia: Secondary | ICD-10-CM | POA: Diagnosis not present

## 2024-01-30 DIAGNOSIS — R269 Unspecified abnormalities of gait and mobility: Secondary | ICD-10-CM | POA: Diagnosis not present

## 2024-01-30 DIAGNOSIS — E782 Mixed hyperlipidemia: Secondary | ICD-10-CM | POA: Diagnosis not present

## 2024-01-30 DIAGNOSIS — Z794 Long term (current) use of insulin: Secondary | ICD-10-CM | POA: Diagnosis not present

## 2024-02-26 DIAGNOSIS — H401131 Primary open-angle glaucoma, bilateral, mild stage: Secondary | ICD-10-CM | POA: Diagnosis not present

## 2024-05-07 DIAGNOSIS — R269 Unspecified abnormalities of gait and mobility: Secondary | ICD-10-CM | POA: Diagnosis not present

## 2024-05-07 DIAGNOSIS — E1165 Type 2 diabetes mellitus with hyperglycemia: Secondary | ICD-10-CM | POA: Diagnosis not present

## 2024-05-07 DIAGNOSIS — I10 Essential (primary) hypertension: Secondary | ICD-10-CM | POA: Diagnosis not present

## 2024-05-07 DIAGNOSIS — Z794 Long term (current) use of insulin: Secondary | ICD-10-CM | POA: Diagnosis not present

## 2024-05-07 DIAGNOSIS — N182 Chronic kidney disease, stage 2 (mild): Secondary | ICD-10-CM | POA: Diagnosis not present

## 2024-05-07 DIAGNOSIS — E782 Mixed hyperlipidemia: Secondary | ICD-10-CM | POA: Diagnosis not present

## 2024-05-07 DIAGNOSIS — D751 Secondary polycythemia: Secondary | ICD-10-CM | POA: Diagnosis not present

## 2024-05-07 DIAGNOSIS — R29898 Other symptoms and signs involving the musculoskeletal system: Secondary | ICD-10-CM | POA: Diagnosis not present

## 2024-05-07 DIAGNOSIS — R5383 Other fatigue: Secondary | ICD-10-CM | POA: Diagnosis not present

## 2024-05-14 DIAGNOSIS — I10 Essential (primary) hypertension: Secondary | ICD-10-CM | POA: Diagnosis not present

## 2024-05-14 DIAGNOSIS — D751 Secondary polycythemia: Secondary | ICD-10-CM | POA: Diagnosis not present

## 2024-05-14 DIAGNOSIS — R269 Unspecified abnormalities of gait and mobility: Secondary | ICD-10-CM | POA: Diagnosis not present

## 2024-05-14 DIAGNOSIS — E1165 Type 2 diabetes mellitus with hyperglycemia: Secondary | ICD-10-CM | POA: Diagnosis not present

## 2024-05-14 DIAGNOSIS — Z794 Long term (current) use of insulin: Secondary | ICD-10-CM | POA: Diagnosis not present

## 2024-05-14 DIAGNOSIS — R29898 Other symptoms and signs involving the musculoskeletal system: Secondary | ICD-10-CM | POA: Diagnosis not present

## 2024-05-14 DIAGNOSIS — N182 Chronic kidney disease, stage 2 (mild): Secondary | ICD-10-CM | POA: Diagnosis not present

## 2024-05-14 DIAGNOSIS — R5383 Other fatigue: Secondary | ICD-10-CM | POA: Diagnosis not present

## 2024-05-14 DIAGNOSIS — E782 Mixed hyperlipidemia: Secondary | ICD-10-CM | POA: Diagnosis not present

## 2024-05-14 DIAGNOSIS — Z0000 Encounter for general adult medical examination without abnormal findings: Secondary | ICD-10-CM | POA: Diagnosis not present
